# Patient Record
Sex: Female | Born: 1950 | Race: White | Hispanic: No | Smoking: Never smoker
Health system: Southern US, Community
[De-identification: ages and names within clinical notes are randomized; demographics above are authoritative.]

## PROBLEM LIST (undated history)

## (undated) DIAGNOSIS — G4730 Sleep apnea, unspecified: Secondary | ICD-10-CM

## (undated) DIAGNOSIS — I10 Essential (primary) hypertension: Secondary | ICD-10-CM

## (undated) DIAGNOSIS — E785 Hyperlipidemia, unspecified: Secondary | ICD-10-CM

## (undated) DIAGNOSIS — L02419 Cutaneous abscess of limb, unspecified: Secondary | ICD-10-CM

## (undated) DIAGNOSIS — L03119 Cellulitis of unspecified part of limb: Secondary | ICD-10-CM

## (undated) DIAGNOSIS — Z6841 Body Mass Index (BMI) 40.0 and over, adult: Secondary | ICD-10-CM

## (undated) DIAGNOSIS — G5603 Carpal tunnel syndrome, bilateral upper limbs: Secondary | ICD-10-CM

## (undated) DIAGNOSIS — L309 Dermatitis, unspecified: Secondary | ICD-10-CM

## (undated) DIAGNOSIS — R0600 Dyspnea, unspecified: Secondary | ICD-10-CM

## (undated) DIAGNOSIS — E119 Type 2 diabetes mellitus without complications: Secondary | ICD-10-CM

## (undated) MED FILL — Dexamethasone Sodium Phosphate Inj 100 MG/10ML: INTRAMUSCULAR | Qty: 1 | Status: AC

## (undated) MED FILL — Fosaprepitant Dimeglumine For IV Infusion 150 MG (Base Eq): INTRAVENOUS | Qty: 5 | Status: AC

---

## 1995-06-21 HISTORY — PX: LIPOMA EXCISION: SHX5283

## 2009-06-20 HISTORY — PX: HERNIA REPAIR: SHX51

## 2010-01-18 HISTORY — PX: OTHER SURGICAL HISTORY: SHX169

## 2017-11-22 DIAGNOSIS — N950 Postmenopausal bleeding: Secondary | ICD-10-CM | POA: Diagnosis not present

## 2017-11-22 DIAGNOSIS — G4733 Obstructive sleep apnea (adult) (pediatric): Secondary | ICD-10-CM | POA: Diagnosis not present

## 2017-11-22 DIAGNOSIS — Z6841 Body Mass Index (BMI) 40.0 and over, adult: Secondary | ICD-10-CM | POA: Diagnosis not present

## 2017-11-22 DIAGNOSIS — G5603 Carpal tunnel syndrome, bilateral upper limbs: Secondary | ICD-10-CM | POA: Diagnosis not present

## 2017-11-22 DIAGNOSIS — E119 Type 2 diabetes mellitus without complications: Secondary | ICD-10-CM | POA: Diagnosis not present

## 2017-11-22 DIAGNOSIS — L309 Dermatitis, unspecified: Secondary | ICD-10-CM | POA: Diagnosis not present

## 2017-11-22 DIAGNOSIS — E782 Mixed hyperlipidemia: Secondary | ICD-10-CM | POA: Diagnosis not present

## 2017-11-22 DIAGNOSIS — I10 Essential (primary) hypertension: Secondary | ICD-10-CM | POA: Diagnosis not present

## 2017-12-06 DIAGNOSIS — R200 Anesthesia of skin: Secondary | ICD-10-CM | POA: Diagnosis not present

## 2017-12-06 DIAGNOSIS — Z79899 Other long term (current) drug therapy: Secondary | ICD-10-CM | POA: Diagnosis not present

## 2017-12-06 DIAGNOSIS — E119 Type 2 diabetes mellitus without complications: Secondary | ICD-10-CM | POA: Insufficient documentation

## 2017-12-06 DIAGNOSIS — E538 Deficiency of other specified B group vitamins: Secondary | ICD-10-CM | POA: Diagnosis not present

## 2017-12-06 DIAGNOSIS — E1142 Type 2 diabetes mellitus with diabetic polyneuropathy: Secondary | ICD-10-CM | POA: Insufficient documentation

## 2017-12-25 DIAGNOSIS — R200 Anesthesia of skin: Secondary | ICD-10-CM | POA: Diagnosis not present

## 2018-01-15 ENCOUNTER — Other Ambulatory Visit: Payer: Self-pay | Admitting: Family Medicine

## 2018-01-15 DIAGNOSIS — Z1231 Encounter for screening mammogram for malignant neoplasm of breast: Secondary | ICD-10-CM

## 2018-01-15 DIAGNOSIS — R200 Anesthesia of skin: Secondary | ICD-10-CM | POA: Diagnosis not present

## 2018-01-18 ENCOUNTER — Ambulatory Visit
Admission: RE | Admit: 2018-01-18 | Discharge: 2018-01-18 | Disposition: A | Discharge status: Home / Self Care | Payer: PPO | Source: Ambulatory Visit | Attending: Family Medicine | Admitting: Family Medicine

## 2018-01-18 ENCOUNTER — Encounter: Payer: Self-pay | Admitting: Radiology

## 2018-01-18 DIAGNOSIS — Z1231 Encounter for screening mammogram for malignant neoplasm of breast: Secondary | ICD-10-CM | POA: Diagnosis not present

## 2018-01-18 IMAGING — MG MM DIGITAL SCREENING BILAT W/ TOMO W/ CAD
8 of 15 series · 8 of 40 positions shown · non-contrast
Comparison: Previous exam(s).

ACR Breast Density Category a: The breast tissue is almost entirely
fatty.

CLINICAL DATA: Screening.

EXAM:
DIGITAL SCREENING BILATERAL MAMMOGRAM WITH TOMO AND CAD

[R MLO synth-2D (1 of 2)]
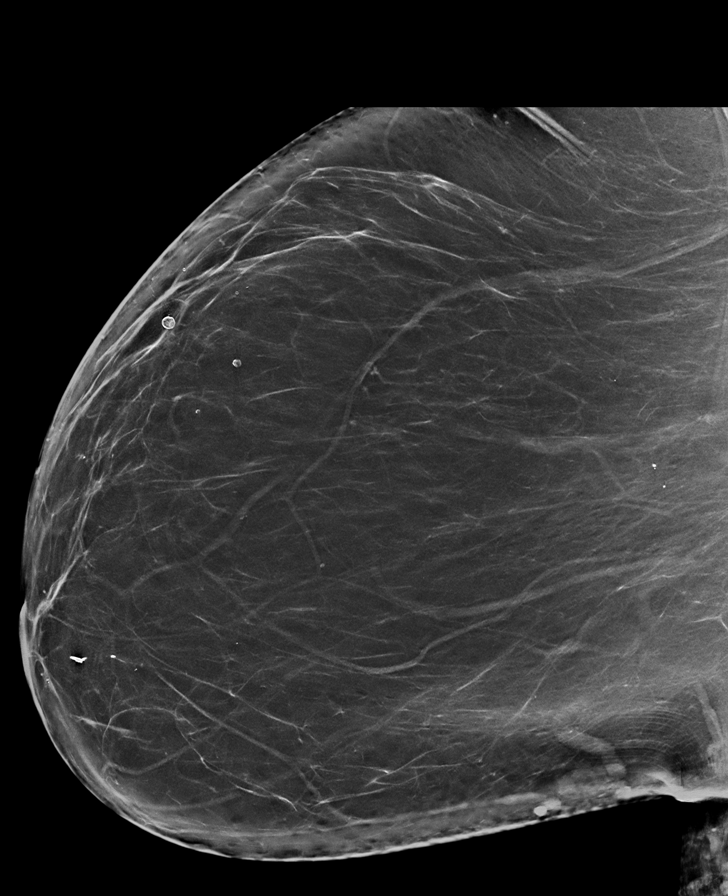

[L MLO synth-2D (1 of 2)]
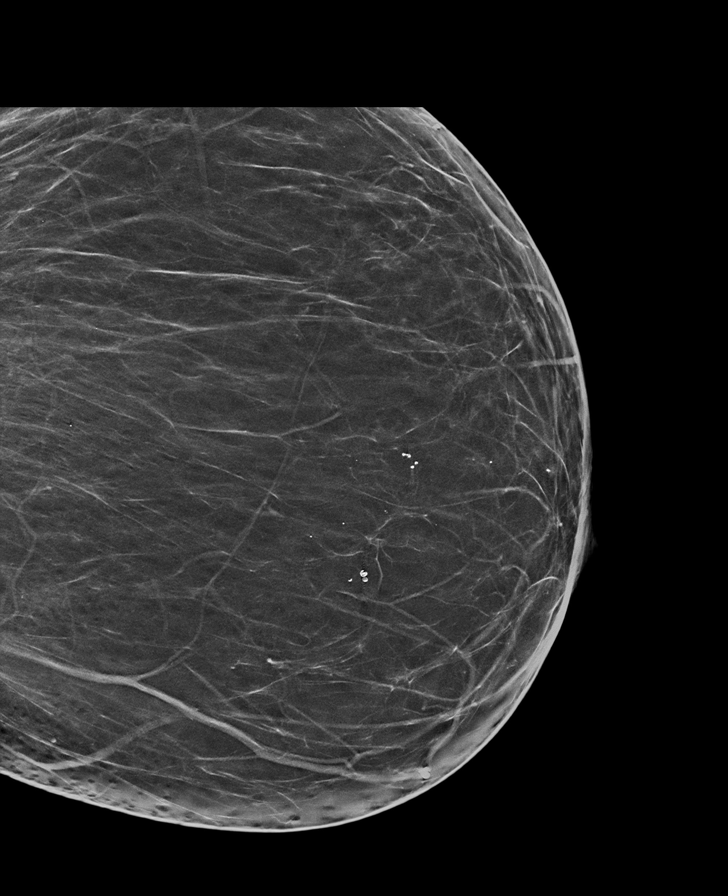

[R CC synth-2D]
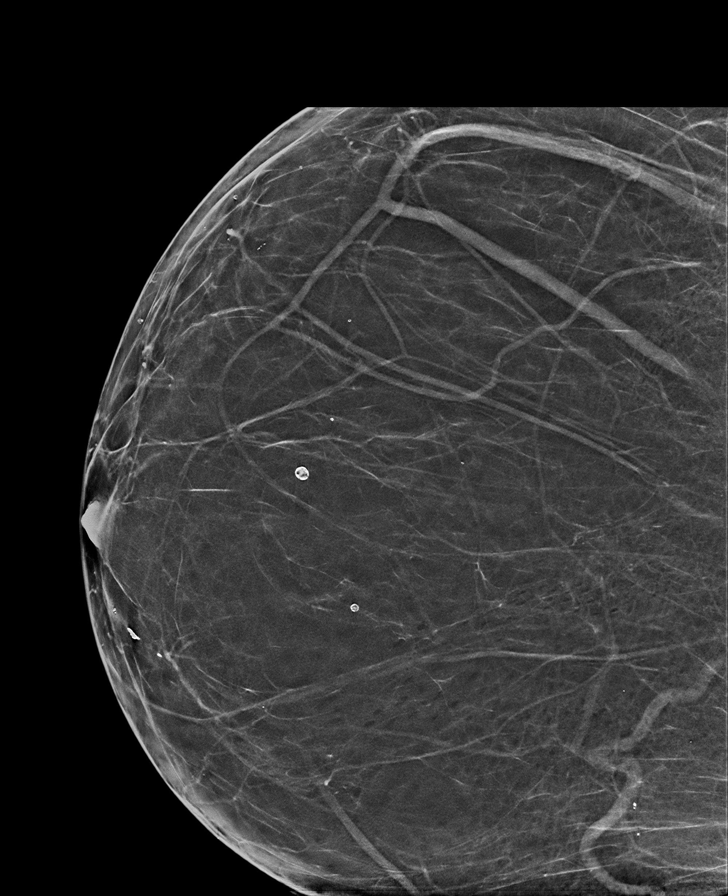

[L MLO synth-2D (2 of 2)]
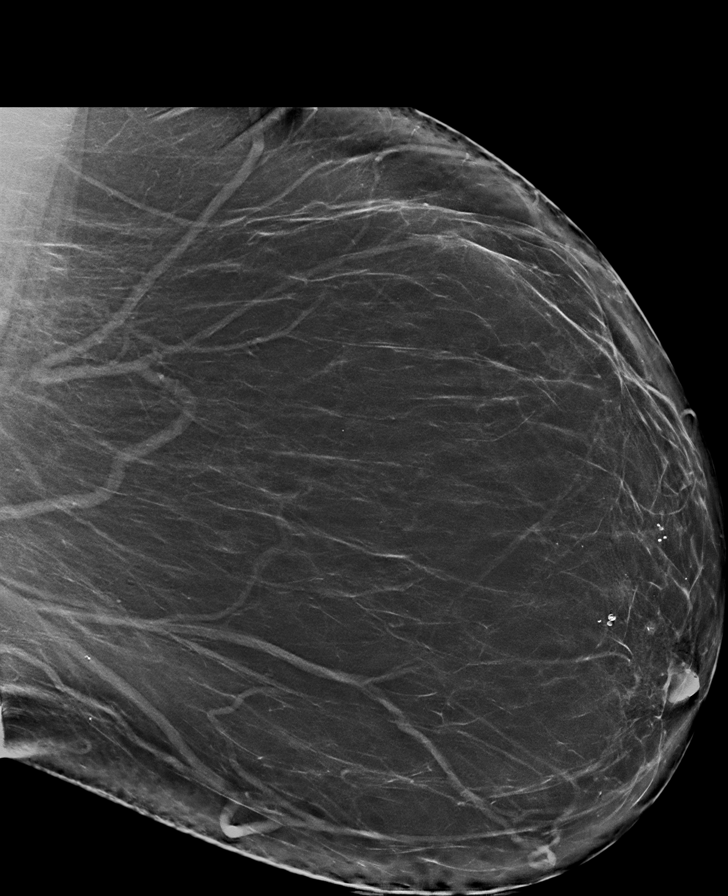

[L CC synth-2D (1 of 2)]
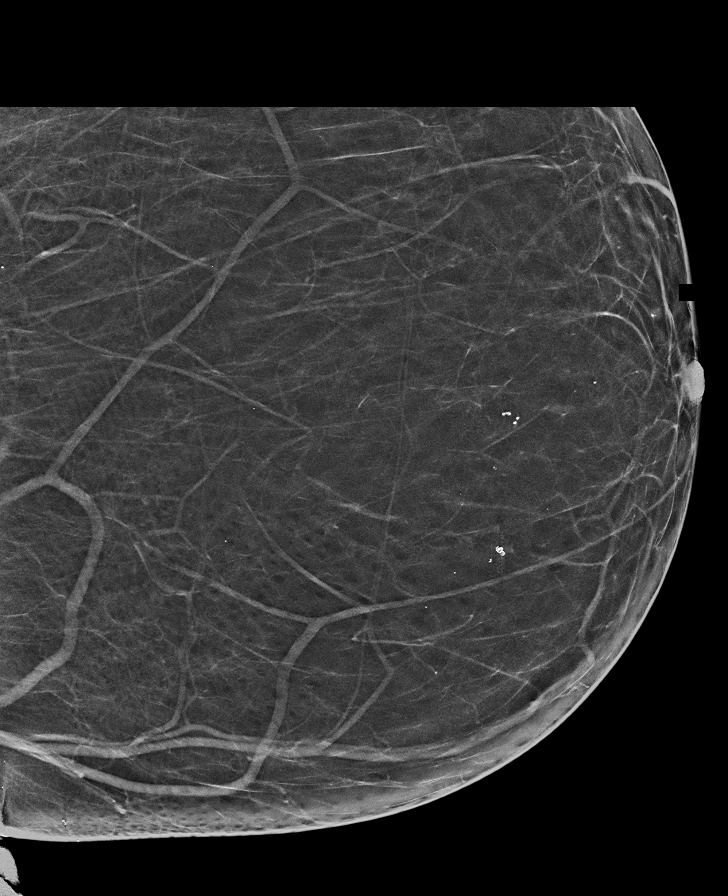

[R MLO synth-2D (2 of 2)]
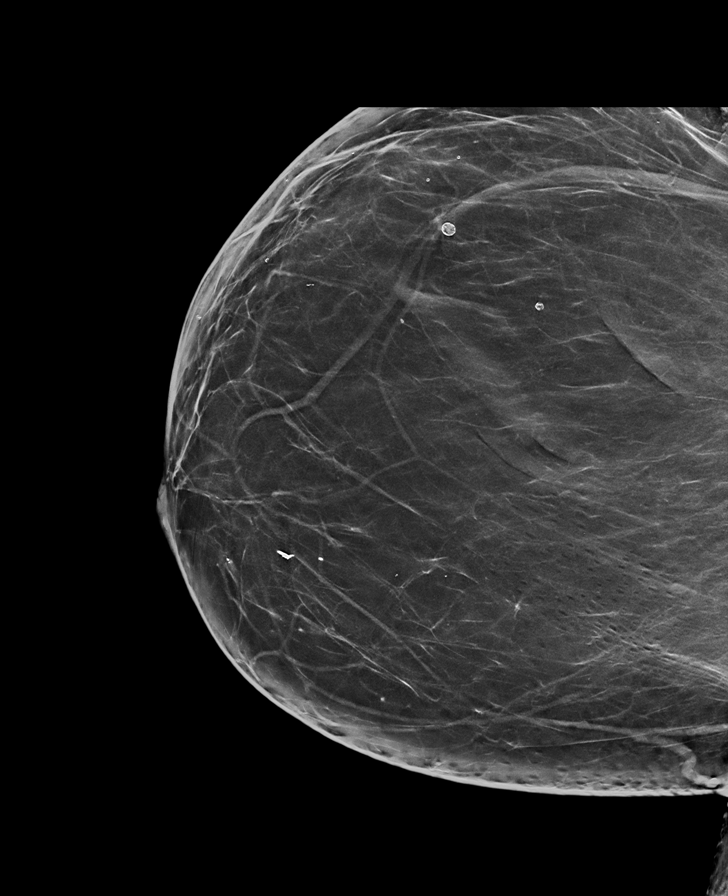

[L CC synth-2D (2 of 2)]
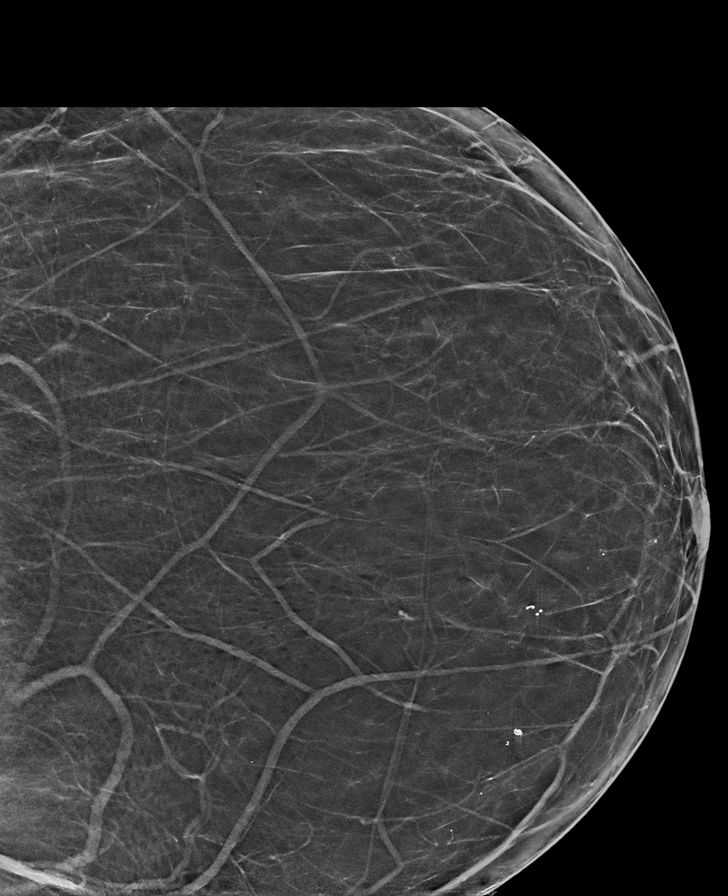

[R CC tomo · tomo slice 51/75.0]
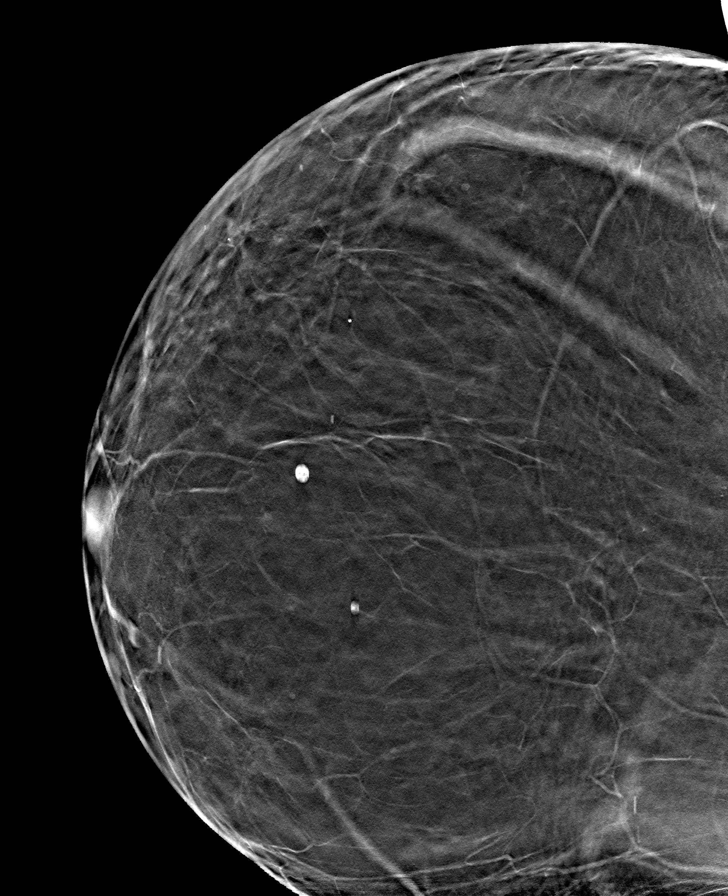

[8 of 40 positions shown; findings below may reference images not displayed]

FINDINGS: There are no findings suspicious for malignancy. Images were
processed with CAD.
IMPRESSION: No mammographic evidence of malignancy. A result letter of this
screening mammogram will be mailed directly to the patient.

RECOMMENDATION:
Screening mammogram in one year. (Code:[TA])

BI-RADS CATEGORY  1: Negative.

## 2018-01-23 DIAGNOSIS — G5603 Carpal tunnel syndrome, bilateral upper limbs: Secondary | ICD-10-CM | POA: Diagnosis not present

## 2018-01-24 ENCOUNTER — Other Ambulatory Visit: Payer: Self-pay | Admitting: *Deleted

## 2018-01-24 ENCOUNTER — Inpatient Hospital Stay
Admission: RE | Admit: 2018-01-24 | Discharge: 2018-01-24 | Disposition: A | Discharge status: Home / Self Care | Payer: Self-pay | Source: Ambulatory Visit | Attending: *Deleted | Admitting: *Deleted

## 2018-01-24 DIAGNOSIS — Z9289 Personal history of other medical treatment: Secondary | ICD-10-CM

## 2018-01-24 DIAGNOSIS — L218 Other seborrheic dermatitis: Secondary | ICD-10-CM | POA: Diagnosis not present

## 2018-01-24 DIAGNOSIS — B372 Candidiasis of skin and nail: Secondary | ICD-10-CM | POA: Diagnosis not present

## 2018-03-05 DIAGNOSIS — E119 Type 2 diabetes mellitus without complications: Secondary | ICD-10-CM | POA: Diagnosis not present

## 2018-03-12 DIAGNOSIS — N950 Postmenopausal bleeding: Secondary | ICD-10-CM | POA: Diagnosis not present

## 2018-03-12 DIAGNOSIS — G5603 Carpal tunnel syndrome, bilateral upper limbs: Secondary | ICD-10-CM | POA: Diagnosis not present

## 2018-03-12 DIAGNOSIS — E1165 Type 2 diabetes mellitus with hyperglycemia: Secondary | ICD-10-CM | POA: Diagnosis not present

## 2018-03-12 DIAGNOSIS — M654 Radial styloid tenosynovitis [de Quervain]: Secondary | ICD-10-CM | POA: Diagnosis not present

## 2018-03-12 DIAGNOSIS — E782 Mixed hyperlipidemia: Secondary | ICD-10-CM | POA: Diagnosis not present

## 2018-03-12 DIAGNOSIS — Z6841 Body Mass Index (BMI) 40.0 and over, adult: Secondary | ICD-10-CM | POA: Diagnosis not present

## 2018-03-12 DIAGNOSIS — G4733 Obstructive sleep apnea (adult) (pediatric): Secondary | ICD-10-CM | POA: Diagnosis not present

## 2018-03-12 DIAGNOSIS — I10 Essential (primary) hypertension: Secondary | ICD-10-CM | POA: Diagnosis not present

## 2018-03-12 DIAGNOSIS — L309 Dermatitis, unspecified: Secondary | ICD-10-CM | POA: Diagnosis not present

## 2018-03-20 DIAGNOSIS — Z6841 Body Mass Index (BMI) 40.0 and over, adult: Secondary | ICD-10-CM

## 2018-03-20 DIAGNOSIS — L03119 Cellulitis of unspecified part of limb: Secondary | ICD-10-CM

## 2018-03-20 DIAGNOSIS — G4730 Sleep apnea, unspecified: Secondary | ICD-10-CM

## 2018-03-20 DIAGNOSIS — R0600 Dyspnea, unspecified: Secondary | ICD-10-CM

## 2018-03-20 DIAGNOSIS — G5603 Carpal tunnel syndrome, bilateral upper limbs: Secondary | ICD-10-CM

## 2018-03-20 DIAGNOSIS — L02419 Cutaneous abscess of limb, unspecified: Secondary | ICD-10-CM

## 2018-03-20 HISTORY — DX: Morbid (severe) obesity due to excess calories: E66.01

## 2018-03-20 HISTORY — DX: Carpal tunnel syndrome, bilateral upper limbs: G56.03

## 2018-03-20 HISTORY — DX: Body Mass Index (BMI) 40.0 and over, adult: Z684

## 2018-03-20 HISTORY — DX: Sleep apnea, unspecified: G47.30

## 2018-03-20 HISTORY — DX: Dyspnea, unspecified: R06.00

## 2018-03-20 HISTORY — DX: Cellulitis of unspecified part of limb: L03.119

## 2018-03-20 HISTORY — PX: DILATION AND CURETTAGE OF UTERUS: SHX78

## 2018-03-20 HISTORY — DX: Cellulitis of unspecified part of limb: L02.419

## 2018-03-28 DIAGNOSIS — G5603 Carpal tunnel syndrome, bilateral upper limbs: Secondary | ICD-10-CM | POA: Diagnosis not present

## 2018-04-05 ENCOUNTER — Other Ambulatory Visit: Payer: Self-pay

## 2018-04-05 ENCOUNTER — Encounter
Admission: RE | Admit: 2018-04-05 | Discharge: 2018-04-05 | Disposition: A | Discharge status: Home / Self Care | Payer: PPO | Source: Ambulatory Visit | Attending: Orthopedic Surgery | Admitting: Orthopedic Surgery

## 2018-04-05 DIAGNOSIS — Z01818 Encounter for other preprocedural examination: Secondary | ICD-10-CM | POA: Insufficient documentation

## 2018-04-05 DIAGNOSIS — I10 Essential (primary) hypertension: Secondary | ICD-10-CM | POA: Diagnosis not present

## 2018-04-05 DIAGNOSIS — G5603 Carpal tunnel syndrome, bilateral upper limbs: Secondary | ICD-10-CM | POA: Insufficient documentation

## 2018-04-05 HISTORY — DX: Body Mass Index (BMI) 40.0 and over, adult: Z684

## 2018-04-05 HISTORY — DX: Type 2 diabetes mellitus without complications: E11.9

## 2018-04-05 HISTORY — DX: Morbid (severe) obesity due to excess calories: E66.01

## 2018-04-05 HISTORY — DX: Cutaneous abscess of limb, unspecified: L02.419

## 2018-04-05 HISTORY — DX: Sleep apnea, unspecified: G47.30

## 2018-04-05 HISTORY — DX: Dyspnea, unspecified: R06.00

## 2018-04-05 HISTORY — DX: Essential (primary) hypertension: I10

## 2018-04-05 HISTORY — DX: Carpal tunnel syndrome, bilateral upper limbs: G56.03

## 2018-04-05 HISTORY — DX: Cellulitis of unspecified part of limb: L03.119

## 2018-04-05 LAB — CBC
HCT: 41.6 % (ref 36.0–46.0)
HEMOGLOBIN: 13.6 g/dL (ref 12.0–15.0)
MCH: 28.6 pg (ref 26.0–34.0)
MCHC: 32.7 g/dL (ref 30.0–36.0)
MCV: 87.6 fL (ref 80.0–100.0)
Platelets: 201 10*3/uL (ref 150–400)
RBC: 4.75 MIL/uL (ref 3.87–5.11)
RDW: 13 % (ref 11.5–15.5)
WBC: 6.7 10*3/uL (ref 4.0–10.5)
nRBC: 0 % (ref 0.0–0.2)

## 2018-04-05 NOTE — Patient Instructions (Signed)
Your procedure is scheduled on: Thursday, October 24th  Report to [facility]    DO NOT STOP ON THE FIRST FLOOR TO REGISTER  To find out your arrival time please call [phone] between 1PM - 3PM on October 23rd  Remember: Instructions that are not followed completely may result in serious medical risk,  up to and including death, or upon the discretion of your surgeon and anesthesiologist your  surgery may need to be rescheduled.     _X__ 1. Do not eat food after midnight the night before your procedure.                 No gum chewing or hard candies..                     ABSOLUTELY NOTHING SOLID IN YOUR MOUTH AFTER MIDNIGHT                  You may drink clear liquids up to 2 hours before you are scheduled to arrive for your surgery-                   DO not drink clear liquids within 2 hours of the start of your surgery.                  Clear Liquids include:  water, apple juice without pulp, clear carbohydrate                 drink such as Clearfast of Gatorade, Black Coffee or Tea (Do not add                 anything to coffee or tea). YOU CAN ADD SUGAR BUT NO DAIRY PRODUCTS  __X__2.  On the morning of surgery brush your teeth with toothpaste and water,                   You may rinse your mouth with mouthwash if you wish.                    Do not swallow any toothpaste of mouthwash.     _X__ 3.  No Alcohol for 24 hours before or after surgery.   _X__ 4.  Do Not Smoke or use e-cigarettes For 24 Hours Prior to Your Surgery.                 Do not use any chewable tobacco products for at least 6 hours prior to                 surgery.  ____  5.  Bring all medications with you on the day of surgery if instructed.   ____  6.  Notify your doctor if there is any change in your medical condition      (cold, fever, infections).     Do not wear jewelry, make-up, hairpins, clips or nail polish. Do not wear lotions, powders, or  perfumes. You may wear deodorant. Do not shave 48 hours prior to surgery. Men may shave face and neck. Do not bring valuables to the hospital.    [facility] is not responsible for any belongings or valuables.  Contacts, dentures or bridgework may not be worn into surgery. Leave your suitcase in the car. After surgery it may be brought to your room. For patients admitted to the hospital, discharge time is determined by your treatment team.  Patients discharged the day of surgery will not be allowed to drive home.   Please read over the following fact sheets that you were given:   [place]   ____ Take these medicines the morning of surgery with A SIP OF WATER:    1. NONE  2.   3.   4.  5.  6.  ____ Fleet Enema (as directed)   __X__ Use CHG Soap as directed  ____ Use inhalers on the day of surgery  _X___ Stop metformin 2 days prior to surgery. LAST DOSE ON April 09, 2018    ____ Take 1/2 of usual insulin dose the night before surgery. No insulin the morning          of surgery.   _X___ Stop ASPIRIN BASED PRODUCTS  _X___ Stop Anti-inflammatories AS OF TODAY.                   YOU MAY TAKE TYLENOL ANYTIME UP UNTIL SURGERY]   _X___ Stop supplements until after surgery.  STOP ALPHA LIPOIC ACID AS OF TODAY  _X___ Bring C-Pap to the hospital.   CONTINUE TAKING LOSARTAN BUT DO NOT TAKE ON THE DAY OF SURGERY  IF YOU COMPLETE THE MEDICAL DIRECTIVES, PLEASE BRING WITH Y OU SO WE MAY PLACE A COPY IN YOUR CHART

## 2018-04-05 NOTE — Pre-Procedure Instructions (Signed)
Patient states that she has no one that would be able to stay OVN with her following surgery. She has a driver to and from the hospital but that is all. Discussed the reasons why it is necessary for her to have someone around for the first 24 hours. She is also dependent on a walker to get around her home and outside. Suggested she look into the attachment for her left arm so she is not using it to put excessive weight on wrist. Also, discussed with Dr. Rosey Bath regarding her BMI of over 70. He would like to evaluate her airway prior to surgery.  Dr. Rosey Bath came and spoke with patient regarding having someone stay ovn with her. He also evaluated her airway and felt comfortable to proceed with surgery

## 2018-04-12 ENCOUNTER — Ambulatory Visit: Payer: PPO | Admitting: Certified Registered"

## 2018-04-12 ENCOUNTER — Encounter: Payer: Self-pay | Admitting: *Deleted

## 2018-04-12 ENCOUNTER — Other Ambulatory Visit: Payer: Self-pay

## 2018-04-12 ENCOUNTER — Ambulatory Visit
Admission: RE | Admit: 2018-04-12 | Discharge: 2018-04-12 | Disposition: A | Discharge status: Home / Self Care | Payer: PPO | Source: Ambulatory Visit | Attending: Orthopedic Surgery | Admitting: Orthopedic Surgery

## 2018-04-12 ENCOUNTER — Encounter
Admission: RE | Disposition: A | Discharge status: Home / Self Care | Payer: Self-pay | Source: Ambulatory Visit | Attending: Orthopedic Surgery

## 2018-04-12 DIAGNOSIS — E119 Type 2 diabetes mellitus without complications: Secondary | ICD-10-CM | POA: Diagnosis not present

## 2018-04-12 DIAGNOSIS — G5602 Carpal tunnel syndrome, left upper limb: Secondary | ICD-10-CM | POA: Diagnosis not present

## 2018-04-12 DIAGNOSIS — Z6841 Body Mass Index (BMI) 40.0 and over, adult: Secondary | ICD-10-CM | POA: Diagnosis not present

## 2018-04-12 DIAGNOSIS — G4730 Sleep apnea, unspecified: Secondary | ICD-10-CM | POA: Insufficient documentation

## 2018-04-12 DIAGNOSIS — I10 Essential (primary) hypertension: Secondary | ICD-10-CM | POA: Diagnosis not present

## 2018-04-12 HISTORY — PX: CARPAL TUNNEL RELEASE: SHX101

## 2018-04-12 LAB — GLUCOSE, CAPILLARY
Glucose-Capillary: 186 mg/dL — ABNORMAL HIGH (ref 70–99)
Glucose-Capillary: 159 mg/dL — ABNORMAL HIGH (ref 70–99)

## 2018-04-12 SURGERY — CARPAL TUNNEL RELEASE
Anesthesia: General | Laterality: Left

## 2018-04-12 MED ORDER — PROPOFOL 500 MG/50ML IV EMUL
INTRAVENOUS | Status: DC | PRN
  Administered 2018-04-12: 80 ug/kg/min via INTRAVENOUS

## 2018-04-12 MED ORDER — PROPOFOL 500 MG/50ML IV EMUL
INTRAVENOUS | Status: AC
  Filled 2018-04-12: qty 50

## 2018-04-12 MED ORDER — HYDROCODONE-ACETAMINOPHEN 5-325 MG PO TABS: 1.0000 | ORAL_TABLET | ORAL | 0 refills | Status: DC | PRN

## 2018-04-12 MED ORDER — FENTANYL CITRATE (PF) 100 MCG/2ML IJ SOLN
INTRAMUSCULAR | Status: DC | PRN
  Administered 2018-04-12: 75 ug via INTRAVENOUS
  Administered 2018-04-12: 25 ug via INTRAVENOUS

## 2018-04-12 MED ORDER — FENTANYL CITRATE (PF) 100 MCG/2ML IJ SOLN
INTRAMUSCULAR | Status: AC
  Filled 2018-04-12: qty 2

## 2018-04-12 MED ORDER — FAMOTIDINE 20 MG PO TABS
ORAL_TABLET | ORAL | Status: AC
  Filled 2018-04-12: qty 1

## 2018-04-12 MED ORDER — PROPOFOL 10 MG/ML IV BOLUS
INTRAVENOUS | Status: AC
  Filled 2018-04-12: qty 20

## 2018-04-12 MED ORDER — BUPIVACAINE HCL 0.5 % IJ SOLN
INTRAMUSCULAR | Status: DC | PRN
  Administered 2018-04-12: 10 mL

## 2018-04-12 MED ORDER — PROPOFOL 10 MG/ML IV BOLUS
INTRAVENOUS | Status: DC | PRN
  Administered 2018-04-12: 180 mg via INTRAVENOUS

## 2018-04-12 MED ORDER — LIDOCAINE HCL (PF) 2 % IJ SOLN
INTRAMUSCULAR | Status: AC
  Filled 2018-04-12: qty 10

## 2018-04-12 MED ORDER — SODIUM CHLORIDE 0.9 % IV SOLN
INTRAVENOUS | Status: DC
  Administered 2018-04-12: 08:00:00 via INTRAVENOUS

## 2018-04-12 MED ORDER — ONDANSETRON HCL 4 MG/2ML IJ SOLN
INTRAMUSCULAR | Status: AC
  Filled 2018-04-12: qty 2

## 2018-04-12 MED ORDER — SUCCINYLCHOLINE CHLORIDE 20 MG/ML IJ SOLN
INTRAMUSCULAR | Status: DC | PRN
  Administered 2018-04-12: 180 mg via INTRAVENOUS

## 2018-04-12 MED ORDER — FENTANYL CITRATE (PF) 100 MCG/2ML IJ SOLN: 25.0000 ug | INTRAMUSCULAR | Status: DC | PRN

## 2018-04-12 MED ORDER — SUCCINYLCHOLINE CHLORIDE 20 MG/ML IJ SOLN
INTRAMUSCULAR | Status: AC
  Filled 2018-04-12: qty 1

## 2018-04-12 MED ORDER — ONDANSETRON HCL 4 MG/2ML IJ SOLN
INTRAMUSCULAR | Status: DC | PRN
  Administered 2018-04-12: 4 mg via INTRAVENOUS

## 2018-04-12 MED ORDER — FAMOTIDINE 20 MG PO TABS
20.0000 mg | ORAL_TABLET | Freq: Once | ORAL | Status: AC
  Administered 2018-04-12: 20 mg via ORAL

## 2018-04-12 MED ORDER — LIDOCAINE HCL (CARDIAC) PF 100 MG/5ML IV SOSY
PREFILLED_SYRINGE | INTRAVENOUS | Status: DC | PRN
  Administered 2018-04-12: 100 mg via INTRAVENOUS

## 2018-04-12 SURGICAL SUPPLY — 23 items
BANDAGE ACE 3X5.8 VEL STRL LF (GAUZE/BANDAGES/DRESSINGS) ×3 IMPLANT
CANISTER SUCT 1200ML W/VALVE (MISCELLANEOUS) ×3 IMPLANT
CHLORAPREP W/TINT 26ML (MISCELLANEOUS) ×3 IMPLANT
COVER WAND RF STERILE (DRAPES) ×3 IMPLANT
CUFF TOURN 18 STER (MISCELLANEOUS) ×3 IMPLANT
ELECT CAUTERY NEEDLE 2.0 MIC (NEEDLE) IMPLANT
GAUZE PETRO XEROFOAM 1X8 (MISCELLANEOUS) ×3 IMPLANT
GAUZE SPONGE 4X4 12PLY STRL (GAUZE/BANDAGES/DRESSINGS) ×3 IMPLANT
GLOVE SURG SYN 9.0  PF PI (GLOVE) ×2
GLOVE SURG SYN 9.0 PF PI (GLOVE) ×1 IMPLANT
GOWN SRG 2XL LVL 4 RGLN SLV (GOWNS) ×1 IMPLANT
GOWN STRL NON-REIN 2XL LVL4 (GOWNS) ×2
GOWN STRL REUS W/ TWL LRG LVL3 (GOWN DISPOSABLE) ×1 IMPLANT
GOWN STRL REUS W/TWL LRG LVL3 (GOWN DISPOSABLE) ×2
KIT TURNOVER KIT A (KITS) ×3 IMPLANT
NS IRRIG 500ML POUR BTL (IV SOLUTION) ×3 IMPLANT
PACK EXTREMITY ARMC (MISCELLANEOUS) ×3 IMPLANT
PAD CAST CTTN 4X4 STRL (SOFTGOODS) ×1 IMPLANT
PADDING CAST COTTON 4X4 STRL (SOFTGOODS) ×2
SCALPEL PROTECTED #15 DISP (BLADE) ×6 IMPLANT
SUT ETHILON 4-0 (SUTURE) ×2
SUT ETHILON 4-0 FS2 18XMFL BLK (SUTURE) ×1
SUTURE ETHLN 4-0 FS2 18XMF BLK (SUTURE) ×1 IMPLANT

## 2018-04-12 NOTE — Anesthesia Procedure Notes (Signed)
Procedure Name: Intubation Performed by: Lance Muss, CRNA Pre-anesthesia Checklist: Patient identified, Patient being monitored, Timeout performed, Emergency Drugs available and Suction available Patient Re-evaluated:Patient Re-evaluated prior to induction Oxygen Delivery Method: Circle system utilized Preoxygenation: Pre-oxygenation with 100% oxygen Induction Type: IV induction Ventilation: Mask ventilation without difficulty Laryngoscope Size: 3 and McGraph Grade View: Grade I Tube type: Oral Tube size: 7.0 mm Number of attempts: 1 Airway Equipment and Method: Stylet and Video-laryngoscopy Placement Confirmation: ETT inserted through vocal cords under direct vision,  positive ETCO2 and breath sounds checked- equal and bilateral Secured at: 20 cm Tube secured with: Tape Dental Injury: Teeth and Oropharynx as per pre-operative assessment  Future Recommendations: Recommend- induction with short-acting agent, and alternative techniques readily available

## 2018-04-12 NOTE — Discharge Instructions (Addendum)
Loosen Ace wrap prior to dismissal today and if fingers swell.  Work on finger range of motion is much as possible.  Keep dressing clean and dry until recheck.  AMBULATORY SURGERY  DISCHARGE INSTRUCTIONS   1) The drugs that you were given will stay in your system until tomorrow so for the next 24 hours you should not:  A) Drive an automobile B) Make any legal decisions C) Drink any alcoholic beverage   2) You may resume regular meals tomorrow.  Today it is better to start with liquids and gradually work up to solid foods.  You may eat anything you prefer, but it is better to start with liquids, then soup and crackers, and gradually work up to solid foods.   3) Please notify your doctor immediately if you have any unusual bleeding, trouble breathing, redness and pain at the surgery site, drainage, fever, or pain not relieved by medication.    4) Additional Instructions:        Please contact your physician with any problems or Same Day Surgery at [phone], Monday through Friday 6 am to 4 pm, or  at [facility] number at [phone].  Carpal Tunnel Release, Care After Refer to this sheet in the next few weeks. These instructions provide you with information about caring for yourself after your procedure. Your health care provider may also give you more specific instructions. Your treatment has been planned according to current medical practices, but problems sometimes occur. Call your health care provider if you have any problems or questions after your procedure. What can I expect after the procedure? After your procedure, it is typical to have the following:  Pain.  Numbness.  Tingling.  Swelling.  Stiffness.  Bruising.  Follow these instructions at home:  Take medicines only as directed by your health care provider.  There are many different ways to close and cover an incision, including stitches (sutures), skin glue, and adhesive strips. Follow  your health care provider's instructions about: ? Incision care. ? Bandage (dressing) changes and removal. ? Incision closure removal.  Wear a splint or a brace as directed by your surgeon. You may need to do this for 2-3 weeks.  Keep your hand raised (elevated) above the level of your heart while you are resting. Move your fingers often.  Avoid activities that cause hand pain.  Ask your surgeon when you can start to do all of your usual activities again, such as: ? Driving. ? Returning to work. ? Bathing and swimming.  Keep all follow-up visits as directed by your health care provider. This is important. You may need physical therapy for several months to speed healing and regain movement. Contact a health care provider if:  You have drainage, redness, swelling, or pain at your incision site.  You have a fever.  You have chills.  Your pain medicine is not working.  Your symptoms do not go away after 2 months.  Your symptoms go away and then return. Get help right away if:  You have pain or numbness that is getting worse.  Your fingers change color.  You are not able to move your fingers. This information is not intended to replace advice given to you by your health care provider. Make sure you discuss any questions you have with your health care provider. Document Released: 12/24/2004 Document Revised: 11/12/2015 Document Reviewed: 01/22/2014 Elsevier Interactive Patient Education  2018 [employer].

## 2018-04-12 NOTE — Transfer of Care (Signed)
Immediate Anesthesia Transfer of Care Note  Patient: Kathryn Maddox  Procedure(s) Performed: CARPAL TUNNEL RELEASE (Left )  Patient Location: PACU  Anesthesia Type:General  Level of Consciousness: awake, alert , oriented and responds to stimulation  Airway & Oxygen Therapy: Patient Spontanous Breathing and Patient connected to face mask oxygen  Post-op Assessment: Report given to RN and Post -op Vital signs reviewed and stable  Post vital signs: Reviewed and stable  Last Vitals:  Vitals Value Taken Time  BP 103/61 04/12/2018  9:31 AM  Temp 36.3 C 04/12/2018  9:30 AM  Pulse 82 04/12/2018  9:31 AM  Resp 14 04/12/2018  9:31 AM  SpO2 98 % 04/12/2018  9:31 AM  Vitals shown include unvalidated device data.  Last Pain:  Vitals:   04/12/18 0742  TempSrc: Oral  PainSc: 0-No pain         Complications: No apparent anesthesia complications

## 2018-04-12 NOTE — H&P (Signed)
Reviewed paper H+P, will be scanned into chart. No changes noted.  

## 2018-04-12 NOTE — Anesthesia Post-op Follow-up Note (Signed)
Anesthesia QCDR form completed.        

## 2018-04-12 NOTE — Anesthesia Preprocedure Evaluation (Addendum)
Anesthesia Evaluation  Patient identified by MRN, date of birth, ID band Patient awake    Reviewed: Allergy & Precautions, H&P , NPO status , Patient's Chart, lab work & pertinent test results  Airway Mallampati: III  TM Distance: <3 FB Neck ROM: full    Dental  (+) Teeth Intact   Pulmonary shortness of breath, sleep apnea ,    breath sounds clear to auscultation       Cardiovascular Exercise Tolerance: Poor hypertension,  Rhythm:regular     Neuro/Psych  Neuromuscular disease (carpal tunnel) negative psych ROS   GI/Hepatic negative GI ROS, Neg liver ROS,   Endo/Other  diabetesMorbid obesity (super morbid obesity, BMI 76.5)  Renal/GU      Musculoskeletal   Abdominal   Peds  Hematology negative hematology ROS (+)   Anesthesia Other Findings Past Medical History: 03/2018: Carpal tunnel syndrome on both sides 03/2018: Cellulitis and abscess of lower extremity     Comment:  bilateral lower extrems, treated with antibx x 10 days No date: Diabetes mellitus without complication ([place]) 32/7614: Dyspnea     Comment:  with any exercise, due to weight No date: Hypertension 03/2018: Morbid obesity with BMI of 70 and over, adult ([place]) 03/2018: Sleep apnea     Comment:  uses a cpap  Past Surgical History: 03/2018: DILATION AND CURETTAGE OF UTERUS     Comment:  has had 10 d & c's with hysteroscopy 2011: HERNIA REPAIR     Comment:  umbilical w/ obstructed bowel 1997: LIPOMA EXCISION; Right     Comment:  shoulder     Reproductive/Obstetrics negative OB ROS                         Anesthesia Physical Anesthesia Plan  ASA: III  Anesthesia Plan: General ETT   Post-op Pain Management:    Induction:   PONV Risk Score and Plan: Ondansetron and Dexamethasone  Airway Management Planned:   Additional Equipment:   Intra-op Plan:   Post-operative Plan:   Informed Consent: I have reviewed the  patients History and Physical, chart, labs and discussed the procedure including the risks, benefits and alternatives for the proposed anesthesia with the patient or authorized representative who has indicated his/her understanding and acceptance.   Dental Advisory Given  Plan Discussed with: Anesthesiologist, CRNA and Surgeon  Anesthesia Plan Comments:        Anesthesia Quick Evaluation

## 2018-04-12 NOTE — Op Note (Signed)
04/12/2018  9:27 AM  PATIENT:  Kathryn Maddox  66 y.o. female  PRE-OPERATIVE DIAGNOSIS:  carpal tunnel syndrome left  POST-OPERATIVE DIAGNOSIS:  carpal tunnel syndrome left  PROCEDURE:  Procedure(s): CARPAL TUNNEL RELEASE (Left)  SURGEON: Laurene Footman, MD  ASSISTANTS: None  ANESTHESIA:   general  EBL:  No intake/output data recorded.  BLOOD ADMINISTERED:none  DRAINS: none   LOCAL MEDICATIONS USED:  MARCAINE     SPECIMEN:  No Specimen  DISPOSITION OF SPECIMEN:  N/A  COUNTS:  YES  TOURNIQUET: 11 minutes at 250 mmHg  IMPLANTS: None  DICTATION: .Dragon Dictation patient was brought to the operating room and after adequate general anesthesia was obtained the left arm was prepped and draped in the usual sterile manner.  After patient identification and timeout procedures were completed, tourniquet was raised just below the elbow.  An incision was made in line with the ring metacarpal approximately 2 cm in length.  The skin and subcutaneous tissue was spread and the transverse carpal ligament identified.  This was then opened in the proximal and with a vascular hemostat placed deep and release was carried out distally until fat was noted around the nerve.  Going proximally release was carried out just proximal to the wrist flexion crease after release there is good vascular blush to the nerve there appeared to be significant compression with some hourglass constriction of the median nerve in the midportion of the carpal tunnel after release there is good vascular blush and moderate flexor tenosynovitis was noted there was no mass there are no masses present the incision was then infiltrated with 10 cc of half percent Sensorcaine and the wound was then closed with simple interrupted 4-0 nylon skin sutures.  Xeroform 4 x 4 web roll and Ace wrap applied tourniquet let down  PLAN OF CARE: Discharge to home after PACU  PATIENT DISPOSITION:  PACU - hemodynamically stable.

## 2018-04-13 NOTE — Anesthesia Postprocedure Evaluation (Signed)
Anesthesia Post Note  Patient: Kathryn Maddox  Procedure(s) Performed: CARPAL TUNNEL RELEASE (Left )  Patient location during evaluation: PACU Anesthesia Type: General Level of consciousness: awake and alert Pain management: pain level controlled Vital Signs Assessment: post-procedure vital signs reviewed and stable Respiratory status: spontaneous breathing, nonlabored ventilation, respiratory function stable and patient connected to nasal cannula oxygen Cardiovascular status: blood pressure returned to baseline and stable Postop Assessment: no apparent nausea or vomiting Anesthetic complications: no     Last Vitals:  Vitals:   04/12/18 1005 04/12/18 1042  BP: (!) 159/75 132/64  Pulse: 68 66  Resp: 18 19  Temp: 36.5 C (!) 35.9 C  SpO2: 92% 94%    Last Pain:  Vitals:   04/12/18 1042  TempSrc: Temporal  PainSc: 0-No pain                 Durenda Hurt

## 2018-04-27 DIAGNOSIS — G5602 Carpal tunnel syndrome, left upper limb: Secondary | ICD-10-CM | POA: Diagnosis not present

## 2018-05-04 DIAGNOSIS — E119 Type 2 diabetes mellitus without complications: Secondary | ICD-10-CM | POA: Diagnosis not present

## 2018-05-30 DIAGNOSIS — G4733 Obstructive sleep apnea (adult) (pediatric): Secondary | ICD-10-CM | POA: Diagnosis not present

## 2018-06-04 DIAGNOSIS — E782 Mixed hyperlipidemia: Secondary | ICD-10-CM | POA: Diagnosis not present

## 2018-06-04 DIAGNOSIS — E1165 Type 2 diabetes mellitus with hyperglycemia: Secondary | ICD-10-CM | POA: Diagnosis not present

## 2018-06-14 DIAGNOSIS — L309 Dermatitis, unspecified: Secondary | ICD-10-CM | POA: Diagnosis not present

## 2018-06-14 DIAGNOSIS — E782 Mixed hyperlipidemia: Secondary | ICD-10-CM | POA: Diagnosis not present

## 2018-06-14 DIAGNOSIS — M25511 Pain in right shoulder: Secondary | ICD-10-CM | POA: Diagnosis not present

## 2018-06-14 DIAGNOSIS — G4733 Obstructive sleep apnea (adult) (pediatric): Secondary | ICD-10-CM | POA: Diagnosis not present

## 2018-06-14 DIAGNOSIS — Z6841 Body Mass Index (BMI) 40.0 and over, adult: Secondary | ICD-10-CM | POA: Diagnosis not present

## 2018-06-14 DIAGNOSIS — E1165 Type 2 diabetes mellitus with hyperglycemia: Secondary | ICD-10-CM | POA: Diagnosis not present

## 2018-06-14 DIAGNOSIS — I10 Essential (primary) hypertension: Secondary | ICD-10-CM | POA: Diagnosis not present

## 2018-06-14 DIAGNOSIS — N950 Postmenopausal bleeding: Secondary | ICD-10-CM | POA: Diagnosis not present

## 2018-07-02 DIAGNOSIS — G4733 Obstructive sleep apnea (adult) (pediatric): Secondary | ICD-10-CM | POA: Diagnosis not present

## 2018-08-01 DIAGNOSIS — G4733 Obstructive sleep apnea (adult) (pediatric): Secondary | ICD-10-CM | POA: Diagnosis not present

## 2018-09-13 DIAGNOSIS — G4733 Obstructive sleep apnea (adult) (pediatric): Secondary | ICD-10-CM | POA: Diagnosis not present

## 2018-10-08 DIAGNOSIS — E1165 Type 2 diabetes mellitus with hyperglycemia: Secondary | ICD-10-CM | POA: Diagnosis not present

## 2018-10-08 DIAGNOSIS — E782 Mixed hyperlipidemia: Secondary | ICD-10-CM | POA: Diagnosis not present

## 2018-10-15 DIAGNOSIS — E782 Mixed hyperlipidemia: Secondary | ICD-10-CM | POA: Diagnosis not present

## 2018-10-15 DIAGNOSIS — Z6841 Body Mass Index (BMI) 40.0 and over, adult: Secondary | ICD-10-CM | POA: Diagnosis not present

## 2018-10-15 DIAGNOSIS — I10 Essential (primary) hypertension: Secondary | ICD-10-CM | POA: Diagnosis not present

## 2018-10-15 DIAGNOSIS — E1165 Type 2 diabetes mellitus with hyperglycemia: Secondary | ICD-10-CM | POA: Diagnosis not present

## 2018-10-15 DIAGNOSIS — G4733 Obstructive sleep apnea (adult) (pediatric): Secondary | ICD-10-CM | POA: Diagnosis not present

## 2018-10-24 DIAGNOSIS — R600 Localized edema: Secondary | ICD-10-CM | POA: Diagnosis not present

## 2018-10-24 DIAGNOSIS — E1142 Type 2 diabetes mellitus with diabetic polyneuropathy: Secondary | ICD-10-CM | POA: Diagnosis not present

## 2018-10-24 DIAGNOSIS — L03031 Cellulitis of right toe: Secondary | ICD-10-CM | POA: Diagnosis not present

## 2018-11-14 DIAGNOSIS — G4733 Obstructive sleep apnea (adult) (pediatric): Secondary | ICD-10-CM | POA: Diagnosis not present

## 2018-12-28 DIAGNOSIS — G4733 Obstructive sleep apnea (adult) (pediatric): Secondary | ICD-10-CM | POA: Diagnosis not present

## 2019-01-07 DIAGNOSIS — E782 Mixed hyperlipidemia: Secondary | ICD-10-CM | POA: Diagnosis not present

## 2019-01-07 DIAGNOSIS — E1165 Type 2 diabetes mellitus with hyperglycemia: Secondary | ICD-10-CM | POA: Diagnosis not present

## 2019-01-14 DIAGNOSIS — E782 Mixed hyperlipidemia: Secondary | ICD-10-CM | POA: Diagnosis not present

## 2019-01-14 DIAGNOSIS — I10 Essential (primary) hypertension: Secondary | ICD-10-CM | POA: Diagnosis not present

## 2019-01-14 DIAGNOSIS — E1165 Type 2 diabetes mellitus with hyperglycemia: Secondary | ICD-10-CM | POA: Diagnosis not present

## 2019-01-14 DIAGNOSIS — Z6841 Body Mass Index (BMI) 40.0 and over, adult: Secondary | ICD-10-CM | POA: Diagnosis not present

## 2019-01-14 DIAGNOSIS — G4733 Obstructive sleep apnea (adult) (pediatric): Secondary | ICD-10-CM | POA: Diagnosis not present

## 2019-01-14 DIAGNOSIS — Z0000 Encounter for general adult medical examination without abnormal findings: Secondary | ICD-10-CM | POA: Diagnosis not present

## 2019-01-15 ENCOUNTER — Other Ambulatory Visit: Payer: Self-pay | Admitting: Family Medicine

## 2019-01-15 DIAGNOSIS — Z1231 Encounter for screening mammogram for malignant neoplasm of breast: Secondary | ICD-10-CM

## 2019-01-24 ENCOUNTER — Other Ambulatory Visit: Payer: Self-pay

## 2019-01-24 ENCOUNTER — Encounter (INDEPENDENT_AMBULATORY_CARE_PROVIDER_SITE_OTHER): Payer: Self-pay

## 2019-01-24 ENCOUNTER — Ambulatory Visit
Admission: RE | Admit: 2019-01-24 | Discharge: 2019-01-24 | Disposition: A | Discharge status: Home / Self Care | Payer: PPO | Source: Ambulatory Visit | Attending: Family Medicine | Admitting: Family Medicine

## 2019-01-24 DIAGNOSIS — Z1231 Encounter for screening mammogram for malignant neoplasm of breast: Secondary | ICD-10-CM | POA: Diagnosis not present

## 2019-01-24 IMAGING — MG DIGITAL SCREENING BILATERAL MAMMOGRAM WITH TOMO AND CAD
8 of 17 series · 8 of 40 positions shown · non-contrast
Comparison: Previous exam(s).

ACR Breast Density Category a: The breast tissue is almost entirely
fatty.

CLINICAL DATA: Screening.

EXAM:
DIGITAL SCREENING BILATERAL MAMMOGRAM WITH TOMO AND CAD

[L MLO synth-2D]
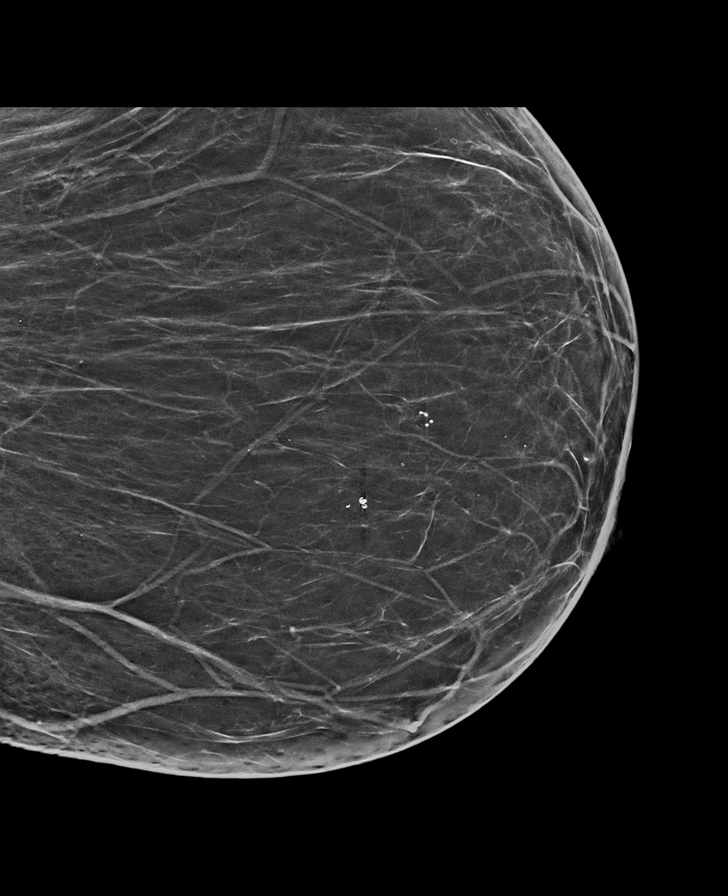

[L CC synth-2D (1 of 2)]
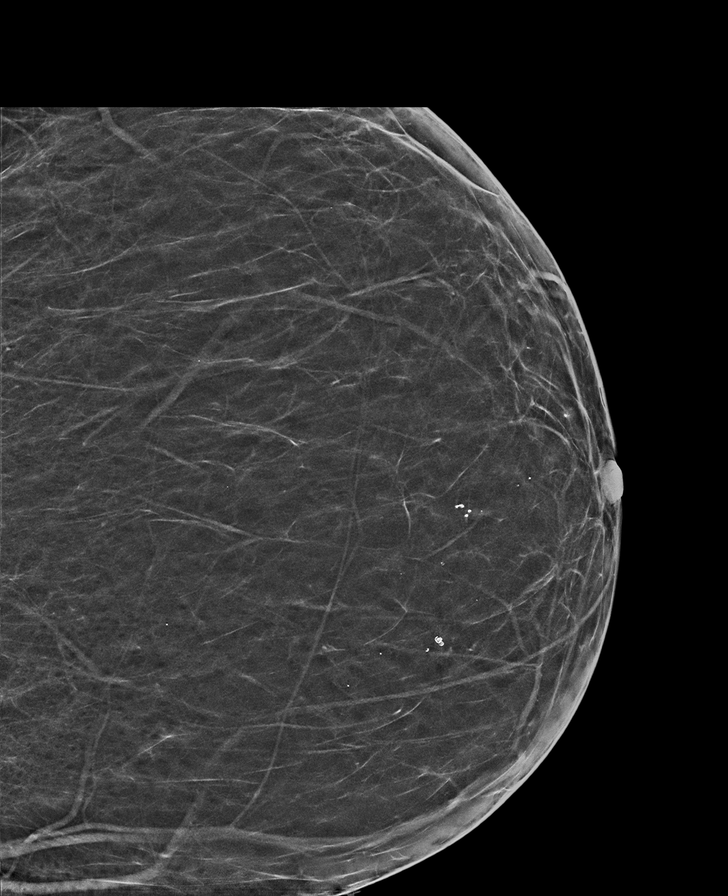

[R MLO synth-2D (1 of 2)]
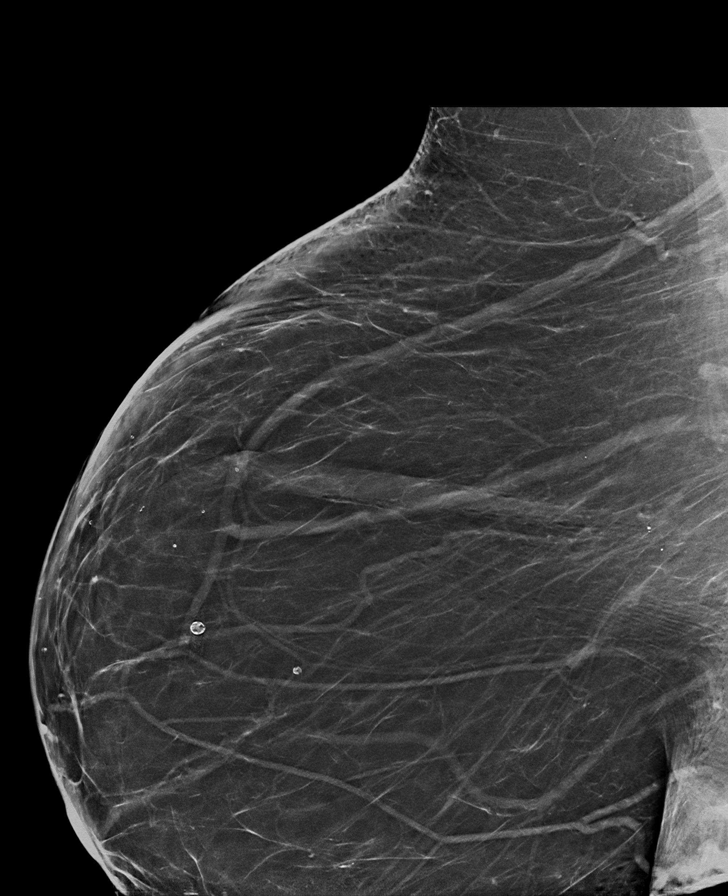

[R CC synth-2D (1 of 2)]
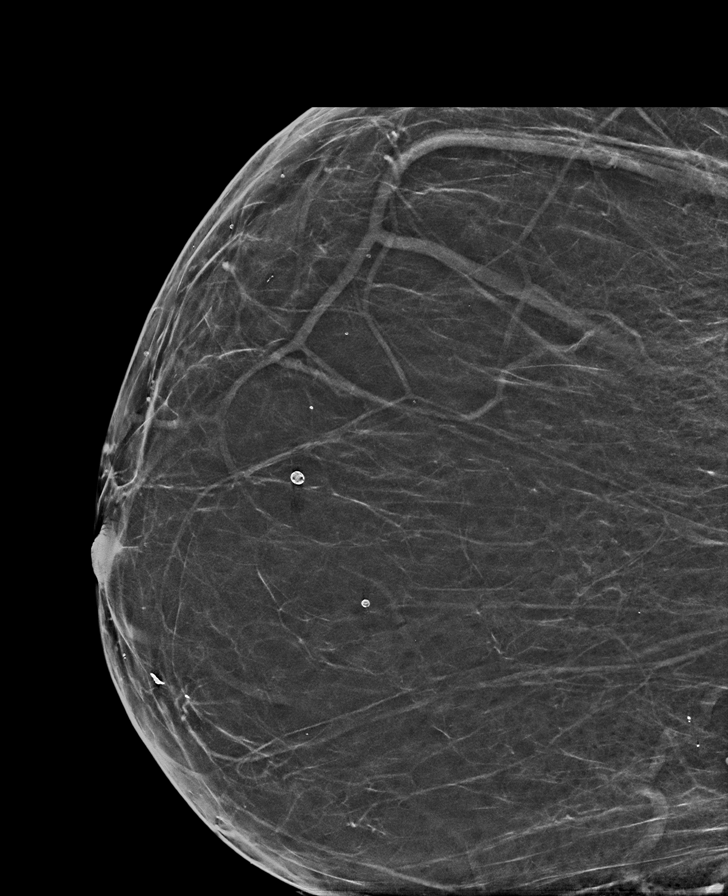

[R CC synth-2D (2 of 2)]
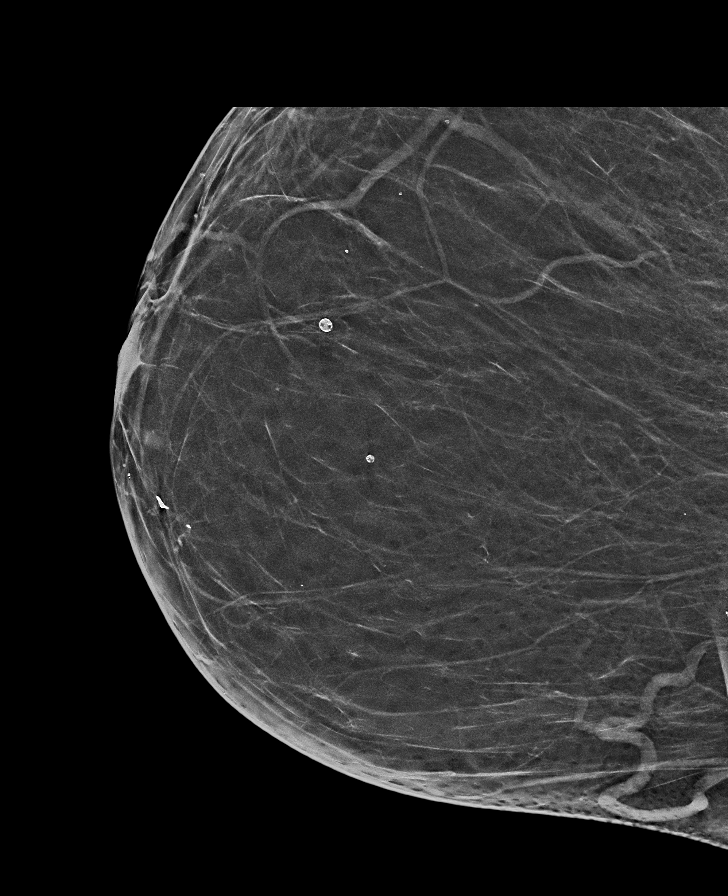

[L CC synth-2D (2 of 2)]
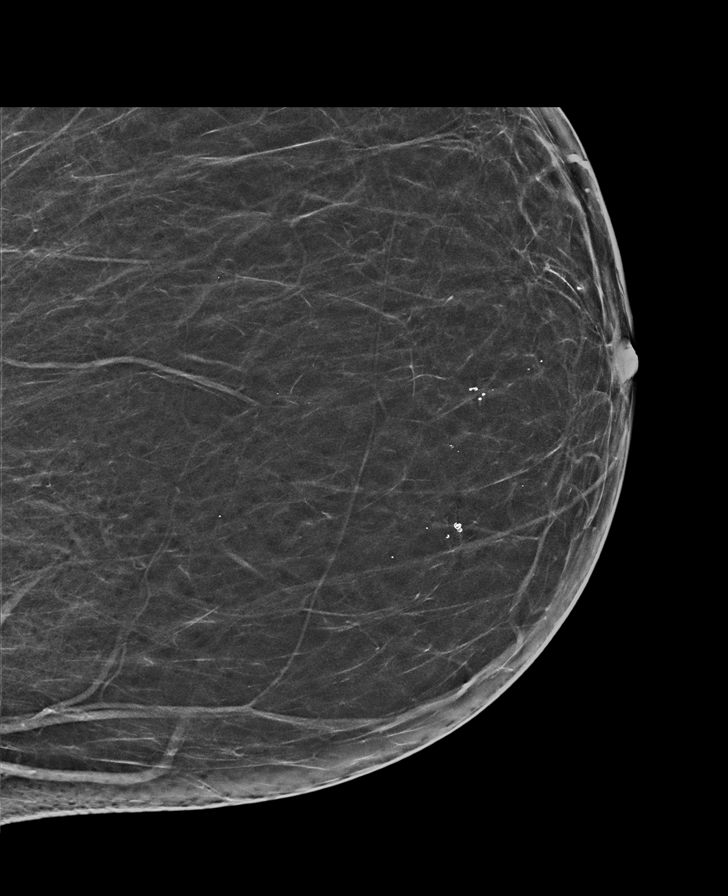

[R MLO synth-2D (2 of 2)]
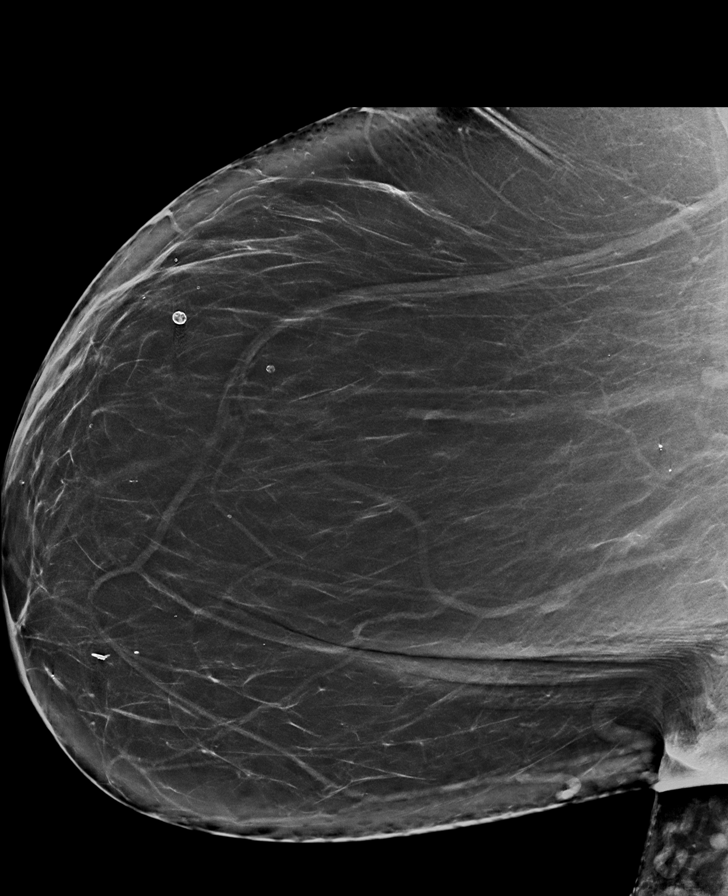

[R CC tomo · tomo slice 36/71.0]
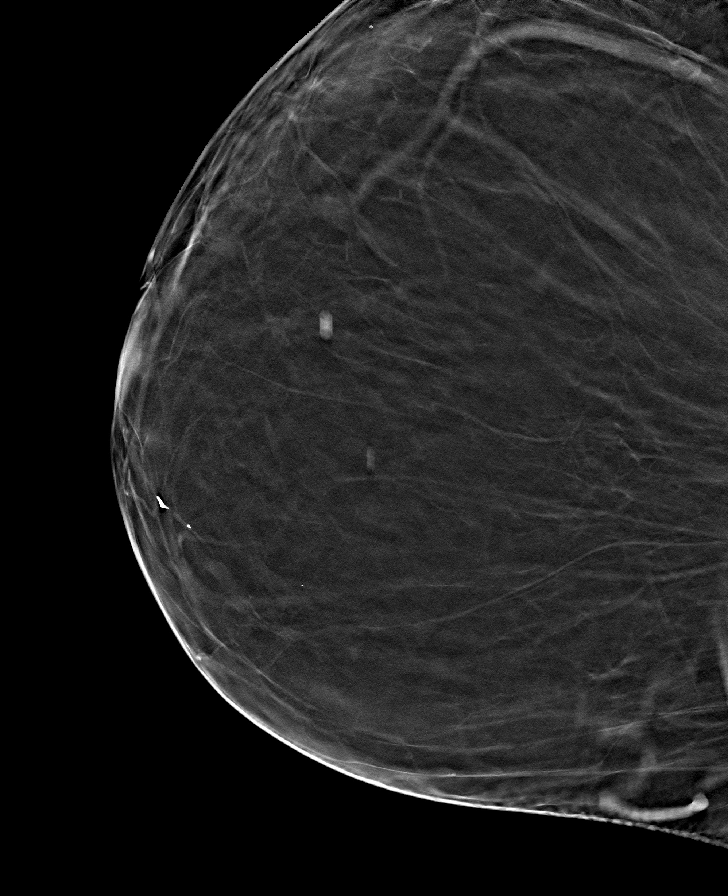

[8 of 40 positions shown; findings below may reference images not displayed]

FINDINGS: There are no findings suspicious for malignancy. Images were
processed with CAD.
IMPRESSION: No mammographic evidence of malignancy. A result letter of this
screening mammogram will be mailed directly to the patient.

RECOMMENDATION:
Screening mammogram in one year. (Code:[TA])

BI-RADS CATEGORY  1: Negative.

## 2019-04-09 DIAGNOSIS — E1165 Type 2 diabetes mellitus with hyperglycemia: Secondary | ICD-10-CM | POA: Diagnosis not present

## 2019-04-09 DIAGNOSIS — E782 Mixed hyperlipidemia: Secondary | ICD-10-CM | POA: Diagnosis not present

## 2019-04-15 DIAGNOSIS — G4733 Obstructive sleep apnea (adult) (pediatric): Secondary | ICD-10-CM | POA: Diagnosis not present

## 2019-04-17 DIAGNOSIS — E1165 Type 2 diabetes mellitus with hyperglycemia: Secondary | ICD-10-CM | POA: Diagnosis not present

## 2019-04-17 DIAGNOSIS — R2681 Unsteadiness on feet: Secondary | ICD-10-CM | POA: Diagnosis not present

## 2019-04-17 DIAGNOSIS — I10 Essential (primary) hypertension: Secondary | ICD-10-CM | POA: Diagnosis not present

## 2019-04-17 DIAGNOSIS — E782 Mixed hyperlipidemia: Secondary | ICD-10-CM | POA: Diagnosis not present

## 2019-04-17 DIAGNOSIS — G4733 Obstructive sleep apnea (adult) (pediatric): Secondary | ICD-10-CM | POA: Diagnosis not present

## 2019-04-17 DIAGNOSIS — Z6841 Body Mass Index (BMI) 40.0 and over, adult: Secondary | ICD-10-CM | POA: Diagnosis not present

## 2019-05-10 DIAGNOSIS — E119 Type 2 diabetes mellitus without complications: Secondary | ICD-10-CM | POA: Diagnosis not present

## 2019-07-11 DIAGNOSIS — E1165 Type 2 diabetes mellitus with hyperglycemia: Secondary | ICD-10-CM | POA: Diagnosis not present

## 2019-07-11 DIAGNOSIS — E782 Mixed hyperlipidemia: Secondary | ICD-10-CM | POA: Diagnosis not present

## 2019-07-18 DIAGNOSIS — E1165 Type 2 diabetes mellitus with hyperglycemia: Secondary | ICD-10-CM | POA: Diagnosis not present

## 2019-07-18 DIAGNOSIS — I10 Essential (primary) hypertension: Secondary | ICD-10-CM | POA: Diagnosis not present

## 2019-07-18 DIAGNOSIS — G4733 Obstructive sleep apnea (adult) (pediatric): Secondary | ICD-10-CM | POA: Diagnosis not present

## 2019-07-18 DIAGNOSIS — Z1211 Encounter for screening for malignant neoplasm of colon: Secondary | ICD-10-CM | POA: Diagnosis not present

## 2019-07-18 DIAGNOSIS — E782 Mixed hyperlipidemia: Secondary | ICD-10-CM | POA: Diagnosis not present

## 2019-07-18 DIAGNOSIS — Z6841 Body Mass Index (BMI) 40.0 and over, adult: Secondary | ICD-10-CM | POA: Diagnosis not present

## 2019-09-02 DIAGNOSIS — Z1211 Encounter for screening for malignant neoplasm of colon: Secondary | ICD-10-CM | POA: Diagnosis not present

## 2019-09-02 DIAGNOSIS — G4733 Obstructive sleep apnea (adult) (pediatric): Secondary | ICD-10-CM | POA: Diagnosis not present

## 2019-09-02 DIAGNOSIS — Z6841 Body Mass Index (BMI) 40.0 and over, adult: Secondary | ICD-10-CM | POA: Diagnosis not present

## 2019-10-09 DIAGNOSIS — E782 Mixed hyperlipidemia: Secondary | ICD-10-CM | POA: Diagnosis not present

## 2019-10-09 DIAGNOSIS — E1165 Type 2 diabetes mellitus with hyperglycemia: Secondary | ICD-10-CM | POA: Diagnosis not present

## 2019-10-16 DIAGNOSIS — Z6841 Body Mass Index (BMI) 40.0 and over, adult: Secondary | ICD-10-CM | POA: Diagnosis not present

## 2019-10-16 DIAGNOSIS — G4733 Obstructive sleep apnea (adult) (pediatric): Secondary | ICD-10-CM | POA: Diagnosis not present

## 2019-10-16 DIAGNOSIS — E782 Mixed hyperlipidemia: Secondary | ICD-10-CM | POA: Diagnosis not present

## 2019-10-16 DIAGNOSIS — I10 Essential (primary) hypertension: Secondary | ICD-10-CM | POA: Diagnosis not present

## 2019-10-16 DIAGNOSIS — E1165 Type 2 diabetes mellitus with hyperglycemia: Secondary | ICD-10-CM | POA: Diagnosis not present

## 2019-11-06 DIAGNOSIS — R2689 Other abnormalities of gait and mobility: Secondary | ICD-10-CM | POA: Diagnosis not present

## 2019-11-06 DIAGNOSIS — R2681 Unsteadiness on feet: Secondary | ICD-10-CM | POA: Diagnosis not present

## 2019-11-06 DIAGNOSIS — M159 Polyosteoarthritis, unspecified: Secondary | ICD-10-CM | POA: Diagnosis not present

## 2019-11-28 ENCOUNTER — Other Ambulatory Visit
Admission: RE | Admit: 2019-11-28 | Discharge: 2019-11-28 | Disposition: A | Discharge status: Home / Self Care | Payer: PPO | Source: Ambulatory Visit | Attending: Internal Medicine | Admitting: Internal Medicine

## 2019-11-28 ENCOUNTER — Other Ambulatory Visit: Payer: Self-pay

## 2019-11-28 DIAGNOSIS — Z20822 Contact with and (suspected) exposure to covid-19: Secondary | ICD-10-CM | POA: Insufficient documentation

## 2019-11-28 DIAGNOSIS — Z01812 Encounter for preprocedural laboratory examination: Secondary | ICD-10-CM | POA: Insufficient documentation

## 2019-11-29 ENCOUNTER — Encounter: Payer: Self-pay | Admitting: Internal Medicine

## 2019-11-29 LAB — SARS CORONAVIRUS 2 (TAT 6-24 HRS): SARS Coronavirus 2: NEGATIVE

## 2019-12-02 ENCOUNTER — Encounter: Payer: Self-pay | Admitting: Internal Medicine

## 2019-12-02 ENCOUNTER — Ambulatory Visit: Payer: PPO | Admitting: Anesthesiology

## 2019-12-02 ENCOUNTER — Ambulatory Visit
Admission: RE | Admit: 2019-12-02 | Discharge: 2019-12-02 | Disposition: A | Discharge status: Home / Self Care | Payer: PPO | Attending: Internal Medicine | Admitting: Internal Medicine

## 2019-12-02 ENCOUNTER — Encounter
Admission: RE | Disposition: A | Discharge status: Home / Self Care | Payer: Self-pay | Source: Home / Self Care | Attending: Internal Medicine

## 2019-12-02 ENCOUNTER — Other Ambulatory Visit: Payer: Self-pay

## 2019-12-02 DIAGNOSIS — I10 Essential (primary) hypertension: Secondary | ICD-10-CM | POA: Diagnosis not present

## 2019-12-02 DIAGNOSIS — K648 Other hemorrhoids: Secondary | ICD-10-CM | POA: Diagnosis not present

## 2019-12-02 DIAGNOSIS — Z79899 Other long term (current) drug therapy: Secondary | ICD-10-CM | POA: Diagnosis not present

## 2019-12-02 DIAGNOSIS — E785 Hyperlipidemia, unspecified: Secondary | ICD-10-CM | POA: Diagnosis not present

## 2019-12-02 DIAGNOSIS — E119 Type 2 diabetes mellitus without complications: Secondary | ICD-10-CM | POA: Insufficient documentation

## 2019-12-02 DIAGNOSIS — Z880 Allergy status to penicillin: Secondary | ICD-10-CM | POA: Insufficient documentation

## 2019-12-02 DIAGNOSIS — Z1211 Encounter for screening for malignant neoplasm of colon: Secondary | ICD-10-CM | POA: Insufficient documentation

## 2019-12-02 DIAGNOSIS — Z6841 Body Mass Index (BMI) 40.0 and over, adult: Secondary | ICD-10-CM | POA: Insufficient documentation

## 2019-12-02 DIAGNOSIS — Z7984 Long term (current) use of oral hypoglycemic drugs: Secondary | ICD-10-CM | POA: Insufficient documentation

## 2019-12-02 DIAGNOSIS — Z888 Allergy status to other drugs, medicaments and biological substances status: Secondary | ICD-10-CM | POA: Diagnosis not present

## 2019-12-02 DIAGNOSIS — K5730 Diverticulosis of large intestine without perforation or abscess without bleeding: Secondary | ICD-10-CM | POA: Diagnosis not present

## 2019-12-02 DIAGNOSIS — G4730 Sleep apnea, unspecified: Secondary | ICD-10-CM | POA: Diagnosis not present

## 2019-12-02 DIAGNOSIS — K640 First degree hemorrhoids: Secondary | ICD-10-CM | POA: Insufficient documentation

## 2019-12-02 DIAGNOSIS — K5790 Diverticulosis of intestine, part unspecified, without perforation or abscess without bleeding: Secondary | ICD-10-CM | POA: Diagnosis not present

## 2019-12-02 HISTORY — DX: Hyperlipidemia, unspecified: E78.5

## 2019-12-02 HISTORY — DX: Dermatitis, unspecified: L30.9

## 2019-12-02 HISTORY — PX: COLONOSCOPY WITH PROPOFOL: SHX5780

## 2019-12-02 LAB — GLUCOSE, CAPILLARY: Glucose-Capillary: 126 mg/dL — ABNORMAL HIGH (ref 70–99)

## 2019-12-02 SURGERY — COLONOSCOPY WITH PROPOFOL
Anesthesia: General

## 2019-12-02 MED ORDER — SODIUM CHLORIDE 0.9 % IV SOLN: INTRAVENOUS | Status: DC

## 2019-12-02 MED ORDER — LIDOCAINE HCL (PF) 2 % IJ SOLN
INTRAMUSCULAR | Status: DC | PRN
  Administered 2019-12-02: 120 mg via INTRADERMAL

## 2019-12-02 MED ORDER — MIDAZOLAM HCL 5 MG/5ML IJ SOLN
INTRAMUSCULAR | Status: DC | PRN
Start: 2019-12-02 — End: 2019-12-02
  Administered 2019-12-02: 1 mg via INTRAVENOUS

## 2019-12-02 MED ORDER — PROPOFOL 500 MG/50ML IV EMUL
INTRAVENOUS | Status: DC | PRN
  Administered 2019-12-02: 100 ug/kg/min via INTRAVENOUS

## 2019-12-02 MED ORDER — MIDAZOLAM HCL 2 MG/2ML IJ SOLN
INTRAMUSCULAR | Status: AC
  Filled 2019-12-02: qty 2

## 2019-12-02 MED ORDER — PROPOFOL 10 MG/ML IV BOLUS
INTRAVENOUS | Status: DC | PRN
  Administered 2019-12-02 (×2): 50 mg via INTRAVENOUS

## 2019-12-02 NOTE — Interval H&P Note (Signed)
History and Physical Interval Note:  12/02/2019 2:06 PM  Kathryn Maddox  has presented today for surgery, with the diagnosis of [place].  The various methods of treatment have been discussed with the patient and family. After consideration of risks, benefits and other options for treatment, the patient has consented to  Procedure(s): COLONOSCOPY WITH PROPOFOL (N/A) as a surgical intervention.  The patient's history has been reviewed, patient examined, no change in status, stable for surgery.  I have reviewed the patient's chart and labs.  Questions were answered to the patient's satisfaction.     [place], [place]

## 2019-12-02 NOTE — Op Note (Signed)
[facility] Gastroenterology Patient Name: Kathryn Maddox Procedure Date: 12/02/2019 2:46 PM MRN: [id number] Account #: [id number] Date of Birth: 10/29/1950 Admit Type: Outpatient Age: 69 Room: [facility] ENDO ROOM 3 Gender: Female Note Status: Finalized Procedure:             Colonoscopy Indications:           Screening for colorectal malignant neoplasm Providers:             Benay Pike. Antino Mayabb MD, MD Medicines:             Propofol per Anesthesia Complications:         No immediate complications. Procedure:             Pre-Anesthesia Assessment:                        - The risks and benefits of the procedure and the                         sedation options and risks were discussed with the                         patient. All questions were answered and informed                         consent was obtained.                        - Patient identification and proposed procedure were                         verified prior to the procedure by the nurse. The                         procedure was verified in the procedure room.                        - ASA Grade Assessment: III - A patient with severe                         systemic disease.                        - After reviewing the risks and benefits, the patient                         was deemed in satisfactory condition to undergo the                         procedure.                        After obtaining informed consent, the colonoscope was                         passed under direct vision. Throughout the procedure,                         the patient's blood pressure, pulse, and oxygen  saturations were monitored continuously. The                         Colonoscope was introduced through the anus and                         advanced to the the cecum, identified by appendiceal                         orifice and ileocecal valve. The colonoscopy was                         somewhat  difficult due to significant looping.                         Successful completion of the procedure was aided by                         applying abdominal pressure. The patient tolerated the                         procedure well. The quality of the bowel preparation                         was adequate. The ileocecal valve, appendiceal                         orifice, and rectum were photographed. Findings:      The perianal and digital rectal examinations were normal. Pertinent       negatives include normal sphincter tone and no palpable rectal lesions.      Internal hemorrhoids were found during retroflexion. The hemorrhoids       were Grade I (internal hemorrhoids that do not prolapse). Estimated       blood loss: none.      Multiple small and large-mouthed diverticula were found in the sigmoid       colon.      The exam was otherwise without abnormality. Impression:            - Internal hemorrhoids.                        - Diverticulosis in the sigmoid colon.                        - The examination was otherwise normal.                        - No specimens collected. Recommendation:        - Patient has a contact number available for                         emergencies. The signs and symptoms of potential                         delayed complications were discussed with the patient.                         Return to normal activities tomorrow. Written  discharge instructions were provided to the patient.                        - Resume previous diet.                        - Continue present medications.                        - Repeat colonoscopy in 10 years for screening                         purposes.                        - Return to GI office PRN.                        - The findings and recommendations were discussed with                         the patient. Procedure Code(s):     --- Professional ---                        E4235,  Colorectal cancer screening; colonoscopy on                         individual not meeting criteria for high risk Diagnosis Code(s):     --- Professional ---                        K57.30, Diverticulosis of large intestine without                         perforation or abscess without bleeding                        K64.0, First degree hemorrhoids                        Z12.11, Encounter for screening for malignant neoplasm                         of colon CPT copyright 2019 American [employer] Association. All rights reserved. The codes documented in this report are preliminary and upon coder review may  be revised to meet current compliance requirements. Efrain Sella MD, MD 12/02/2019 3:15:59 PM This report has been signed electronically. Number of Addenda: 0 Note Initiated On: 12/02/2019 2:46 PM Scope Withdrawal Time: 0 hours 5 minutes 59 seconds  Total Procedure Duration: 0 hours 9 minutes 55 seconds  Estimated Blood Loss:  Estimated blood loss: none.      [facility]

## 2019-12-02 NOTE — Anesthesia Preprocedure Evaluation (Signed)
Anesthesia Evaluation  Patient identified by MRN, date of birth, ID band Patient awake    Reviewed: Allergy & Precautions, H&P , NPO status , Patient's Chart, lab work & pertinent test results  History of Anesthesia Complications Negative for: history of anesthetic complications  Airway Mallampati: III  TM Distance: >3 FB Neck ROM: full    Dental no notable dental hx. (+) Teeth Intact, Dental Advisory Given   Pulmonary shortness of breath and with exertion, sleep apnea and Continuous Positive Airway Pressure Ventilation , neg COPD, Patient abstained from smoking.Not current smoker,     + decreased breath sounds      Cardiovascular Exercise Tolerance: Poor METShypertension, (-) CAD and (-) Past MI (-) dysrhythmias  Rhythm:regular Rate:Normal - Systolic murmurs    Neuro/Psych  Neuromuscular disease (carpal tunnel) negative neurological ROS  negative psych ROS   GI/Hepatic negative GI ROS, Neg liver ROS, neg GERD  ,  Endo/Other  diabetesMorbid obesity (super morbid obesity, BMI 76.5)  Renal/GU negative Renal ROS     Musculoskeletal   Abdominal (+) + obese,   Peds  Hematology negative hematology ROS (+)   Anesthesia Other Findings Past Medical History: 03/2018: Carpal tunnel syndrome on both sides 03/2018: Cellulitis and abscess of lower extremity     Comment:  bilateral lower extrems, treated with antibx x 10 days No date: Diabetes mellitus without complication ([place]) 37/1696: Dyspnea     Comment:  with any exercise, due to weight No date: Hypertension 03/2018: Morbid obesity with BMI of 70 and over, adult ([place]) 03/2018: Sleep apnea     Comment:  uses a cpap  Past Surgical History: 03/2018: DILATION AND CURETTAGE OF UTERUS     Comment:  has had 10 d & c's with hysteroscopy 2011: HERNIA REPAIR     Comment:  umbilical w/ obstructed bowel 1997: LIPOMA EXCISION; Right     Comment:  shoulder      Reproductive/Obstetrics negative OB ROS                             Anesthesia Physical  Anesthesia Plan  ASA: III  Anesthesia Plan: General   Post-op Pain Management:    Induction: Intravenous  PONV Risk Score and Plan: 3 and Propofol infusion and TIVA  Airway Management Planned: Natural Airway, Oral ETT and Nasal CPAP  Additional Equipment: None  Intra-op Plan:   Post-operative Plan:   Informed Consent: I have reviewed the patients History and Physical, chart, labs and discussed the procedure including the risks, benefits and alternatives for the proposed anesthesia with the patient or authorized representative who has indicated his/her understanding and acceptance.     Dental Advisory Given  Plan Discussed with: CRNA and Surgeon  Anesthesia Plan Comments: (Discussed risks of anesthesia with patient, including possibility of difficulty with spontaneous ventilation under anesthesia necessitating airway intervention, PONV, and rare risks such as cardiac or respiratory or neurological events. Patient understands. Patient informed about increased incidence of above perioperative risk due to high BMI. Patient understands. )        Anesthesia Quick Evaluation

## 2019-12-02 NOTE — Anesthesia Procedure Notes (Signed)
Date/Time: 12/02/2019 3:06 PM Performed by: Debe Coder, CRNA Pre-anesthesia Checklist: Patient identified, Emergency Drugs available, Suction available, Patient being monitored and Timeout performed Patient Re-evaluated:Patient Re-evaluated prior to induction Oxygen Delivery Method: Supernova nasal CPAP Preoxygenation: Pre-oxygenation with 100% oxygen

## 2019-12-02 NOTE — Transfer of Care (Signed)
Immediate Anesthesia Transfer of Care Note  Patient: Kathryn Maddox  Procedure(s) Performed: COLONOSCOPY WITH PROPOFOL (N/A )  Patient Location: PACU and Endoscopy Unit  Anesthesia Type:General  Level of Consciousness: awake, alert  and oriented  Airway & Oxygen Therapy: Patient Spontanous Breathing  Post-op Assessment: Report given to RN and Post -op Vital signs reviewed and stable  Post vital signs: Reviewed and stable  Last Vitals:  Vitals Value Taken Time  BP 123/92 12/02/19 1519  Temp    Pulse 75 12/02/19 1519  Resp 29 12/02/19 1519  SpO2 96 % 12/02/19 1519    Last Pain:  Vitals:   12/02/19 1358  TempSrc: Tympanic  PainSc: 0-No pain         Complications: No complications documented.

## 2019-12-02 NOTE — H&P (Signed)
Outpatient short stay form Pre-procedure 12/02/2019 12:04 PM Keyonia Gluth K. Alice Reichert, M.D.  Primary Physician: Thereasa Distance, M.D.  Reason for visit:  Colon cancer screening  History of present illness:  Patient presents for colonoscopy for colon cancer screening. The patient denies complaints of abdominal pain, significant change in bowel habits, or rectal bleeding.     No current facility-administered medications for this encounter.  Current Outpatient Medications:  .  rosuvastatin (CRESTOR) 5 MG tablet, Take 5 mg by mouth daily., Disp: , Rfl:  .  sitaGLIPtin (JANUVIA) 100 MG tablet, Take 100 mg by mouth daily., Disp: , Rfl:  .  acetaminophen (TYLENOL) 650 MG CR tablet, Take 1,300 mg by mouth every 8 (eight) hours as needed for pain., Disp: , Rfl:  .  Alpha-Lipoic Acid 200 MG TABS, Take 200 mg by mouth 3 (three) times daily., Disp: , Rfl:  .  HYDROcodone-acetaminophen (NORCO) 5-325 MG tablet, Take 1 tablet by mouth every 4 (four) hours as needed for moderate pain., Disp: 20 tablet, Rfl: 0 .  losartan (COZAAR) 50 MG tablet, Take 50 mg by mouth daily., Disp: , Rfl:  .  metFORMIN (GLUCOPHAGE-XR) 500 MG 24 hr tablet, Take 500 mg by mouth daily., Disp: , Rfl:   No medications prior to admission.     Allergies  Allergen Reactions  . Megace [Megestrol] Rash  . Penicillins Rash and Other (See Comments)    Has patient had a PCN reaction causing immediate rash, facial/tongue/throat swelling, SOB or lightheadedness with hypotension: Unknown Has patient had a PCN reaction causing severe rash involving mucus membranes or skin necrosis: Unknown Has patient had a PCN reaction that required hospitalization: Unknown Has patient had a PCN reaction occurring within the last 10 years: No If all of the above answers are "NO", then may proceed with Cephalosporin use.   . Pravastatin Other (See Comments)    Muscle pain, extreme constipation     Past Medical History:  Diagnosis Date  . Carpal tunnel  syndrome on both sides 03/2018  . Cellulitis and abscess of lower extremity 03/2018   bilateral lower extrems, treated with antibx x 10 days  . Diabetes mellitus without complication ([place])   . Dyspnea 03/2018   with any exercise, due to weight  . Eczema   . Hyperlipidemia   . Hypertension   . Morbid obesity with BMI of 70 and over, adult ([place]) 03/2018  . Sleep apnea 03/2018   uses a cpap    Review of systems:  Otherwise negative.    Physical Exam  Gen: Alert, oriented. Appears stated age.  HEENT: [place]/AT. PERRLA. Lungs: CTA, no wheezes. CV: RR nl S1, S2. Abd: soft, benign, no masses. BS+ Ext: No edema. Pulses 2+    Planned procedures: Proceed with colonoscopy. The patient understands the nature of the planned procedure, indications, risks, alternatives and potential complications including but not limited to bleeding, infection, perforation, damage to internal organs and possible oversedation/side effects from anesthesia. The patient agrees and gives consent to proceed.  Please refer to procedure notes for findings, recommendations and patient disposition/instructions.     [place]ha Behlke K. Alice Reichert, M.D. Gastroenterology 12/02/2019  12:04 PM

## 2019-12-02 NOTE — Anesthesia Postprocedure Evaluation (Signed)
Anesthesia Post Note  Patient: Kathryn Maddox  Procedure(s) Performed: COLONOSCOPY WITH PROPOFOL (N/A )  Patient location during evaluation: Endoscopy Anesthesia Type: General Level of consciousness: awake and alert Pain management: pain level controlled Vital Signs Assessment: post-procedure vital signs reviewed and stable Respiratory status: spontaneous breathing, nonlabored ventilation, respiratory function stable and patient connected to nasal cannula oxygen Cardiovascular status: blood pressure returned to baseline and stable Postop Assessment: no apparent nausea or vomiting Anesthetic complications: no   No complications documented.   Last Vitals:  Vitals:   12/02/19 1537 12/02/19 1547  BP: 139/70 135/76  Pulse: 69   Resp: (!) 26   Temp:    SpO2: 95%     Last Pain:  Vitals:   12/02/19 1547  TempSrc:   PainSc: 0-No pain                 Arita Miss

## 2019-12-03 ENCOUNTER — Encounter: Payer: Self-pay | Admitting: Internal Medicine

## 2019-12-07 DIAGNOSIS — M159 Polyosteoarthritis, unspecified: Secondary | ICD-10-CM | POA: Diagnosis not present

## 2019-12-07 DIAGNOSIS — R2689 Other abnormalities of gait and mobility: Secondary | ICD-10-CM | POA: Diagnosis not present

## 2019-12-11 ENCOUNTER — Other Ambulatory Visit: Payer: Self-pay | Admitting: Family Medicine

## 2019-12-11 DIAGNOSIS — G4733 Obstructive sleep apnea (adult) (pediatric): Secondary | ICD-10-CM | POA: Diagnosis not present

## 2019-12-11 DIAGNOSIS — Z1231 Encounter for screening mammogram for malignant neoplasm of breast: Secondary | ICD-10-CM

## 2020-01-06 DIAGNOSIS — M159 Polyosteoarthritis, unspecified: Secondary | ICD-10-CM | POA: Diagnosis not present

## 2020-01-06 DIAGNOSIS — R2689 Other abnormalities of gait and mobility: Secondary | ICD-10-CM | POA: Diagnosis not present

## 2020-01-08 DIAGNOSIS — E782 Mixed hyperlipidemia: Secondary | ICD-10-CM | POA: Diagnosis not present

## 2020-01-08 DIAGNOSIS — E1165 Type 2 diabetes mellitus with hyperglycemia: Secondary | ICD-10-CM | POA: Diagnosis not present

## 2020-01-16 DIAGNOSIS — G4733 Obstructive sleep apnea (adult) (pediatric): Secondary | ICD-10-CM | POA: Diagnosis not present

## 2020-01-16 DIAGNOSIS — E782 Mixed hyperlipidemia: Secondary | ICD-10-CM | POA: Diagnosis not present

## 2020-01-16 DIAGNOSIS — I10 Essential (primary) hypertension: Secondary | ICD-10-CM | POA: Diagnosis not present

## 2020-01-16 DIAGNOSIS — Z0000 Encounter for general adult medical examination without abnormal findings: Secondary | ICD-10-CM | POA: Diagnosis not present

## 2020-01-16 DIAGNOSIS — E1165 Type 2 diabetes mellitus with hyperglycemia: Secondary | ICD-10-CM | POA: Diagnosis not present

## 2020-01-16 DIAGNOSIS — Z6841 Body Mass Index (BMI) 40.0 and over, adult: Secondary | ICD-10-CM | POA: Diagnosis not present

## 2020-02-06 ENCOUNTER — Other Ambulatory Visit: Payer: Self-pay

## 2020-02-06 ENCOUNTER — Ambulatory Visit
Admission: RE | Admit: 2020-02-06 | Discharge: 2020-02-06 | Disposition: A | Discharge status: Home / Self Care | Payer: PPO | Source: Ambulatory Visit | Attending: Family Medicine | Admitting: Family Medicine

## 2020-02-06 DIAGNOSIS — Z1231 Encounter for screening mammogram for malignant neoplasm of breast: Secondary | ICD-10-CM | POA: Diagnosis not present

## 2020-02-06 DIAGNOSIS — M159 Polyosteoarthritis, unspecified: Secondary | ICD-10-CM | POA: Diagnosis not present

## 2020-02-06 DIAGNOSIS — R2689 Other abnormalities of gait and mobility: Secondary | ICD-10-CM | POA: Diagnosis not present

## 2020-02-06 IMAGING — MG DIGITAL SCREENING BILAT W/ TOMO W/ CAD
8 of 15 series · 8 of 40 positions shown · non-contrast
Comparison: Previous exam(s).

CLINICAL DATA: Screening.

EXAM:
DIGITAL SCREENING BILATERAL MAMMOGRAM WITH TOMO AND CAD

[L CC synth-2D (1 of 2)]
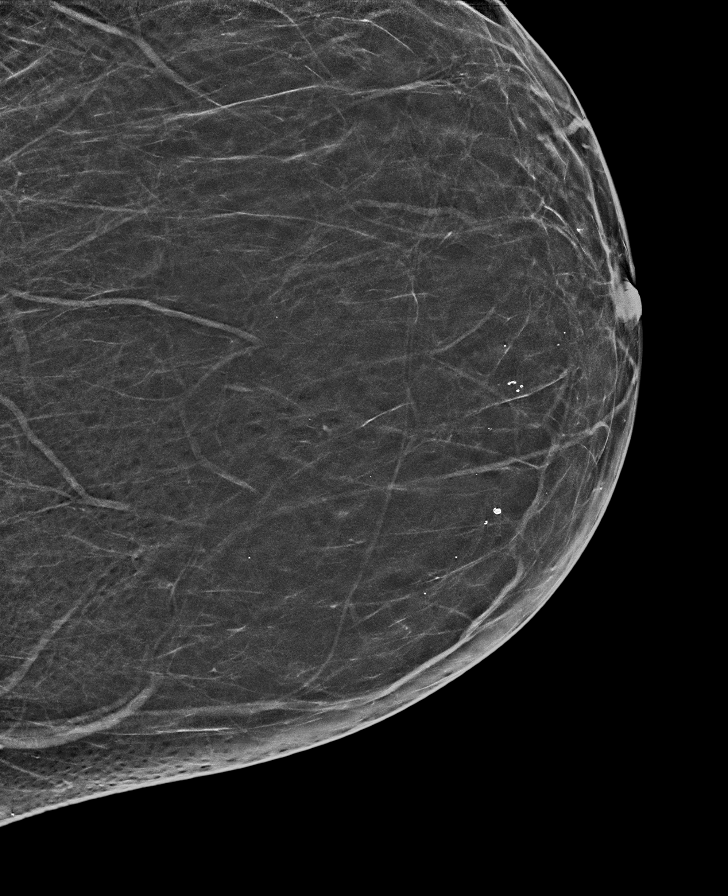

[L MLO synth-2D (1 of 2)]
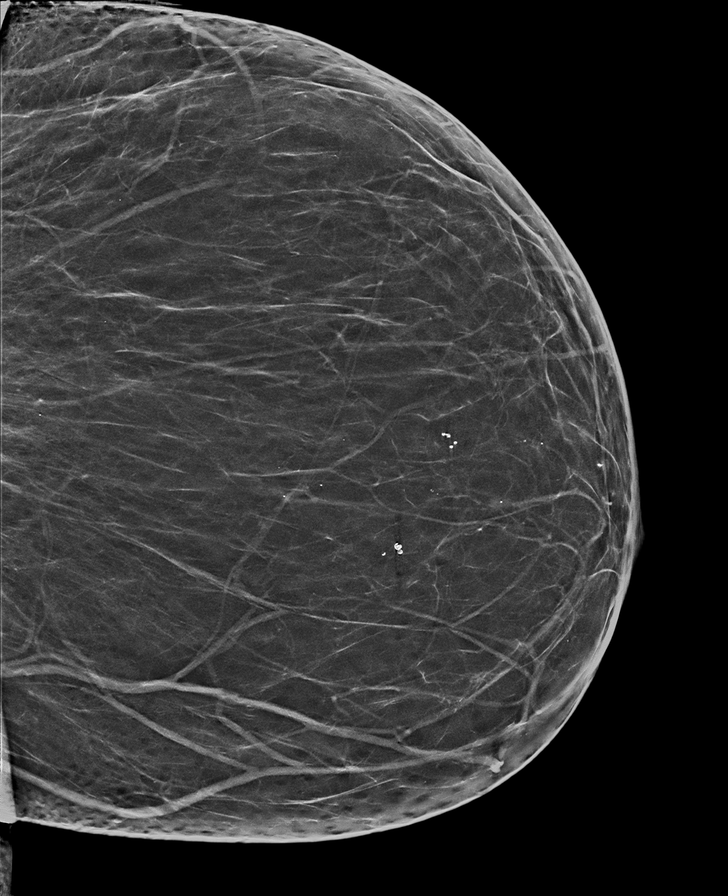

[R CC synth-2D (1 of 2)]
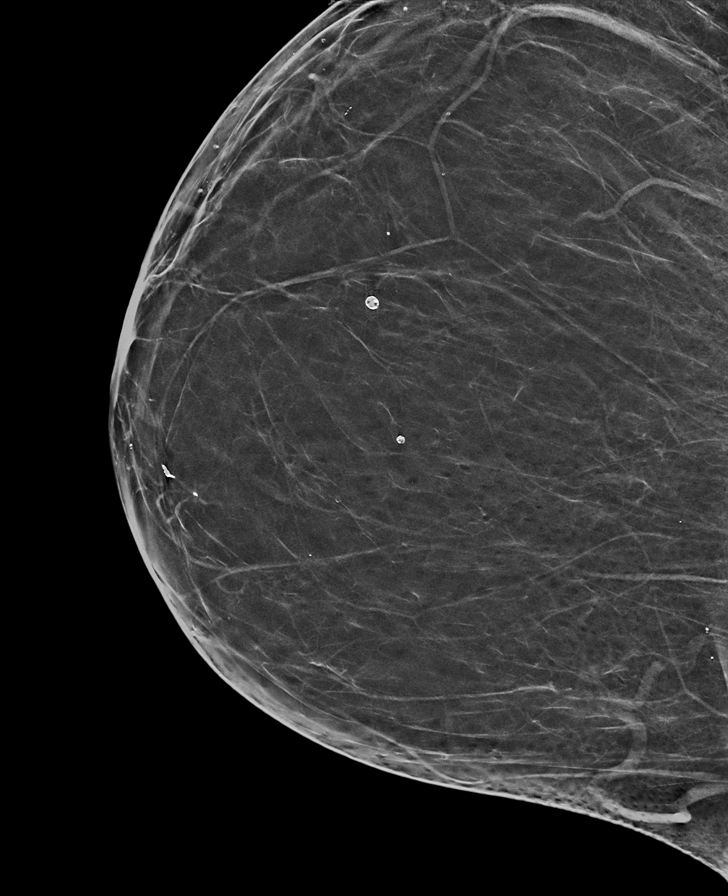

[R MLO synth-2D]
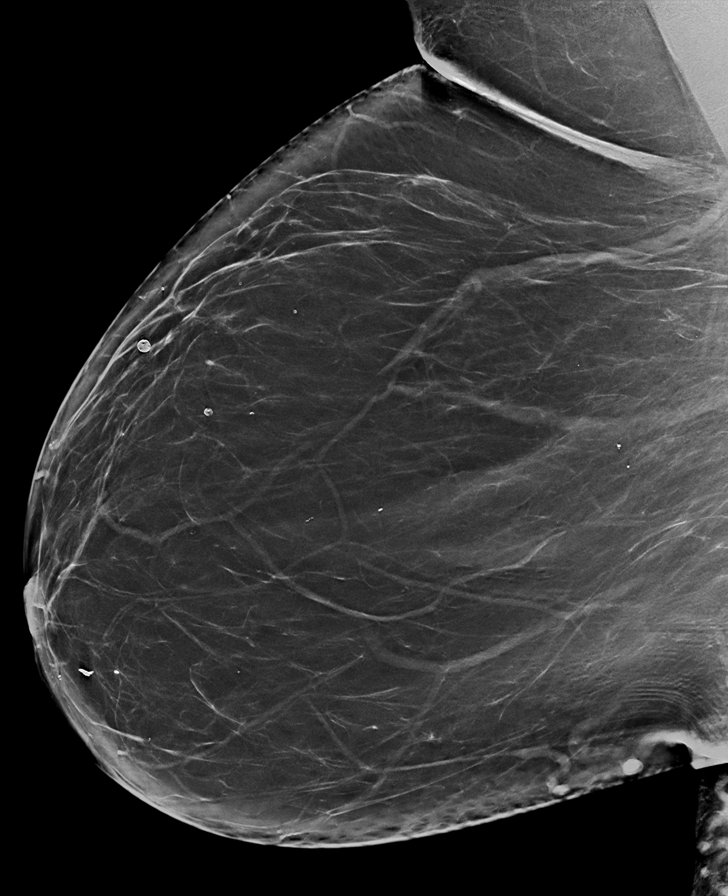

[L MLO synth-2D (2 of 2)]
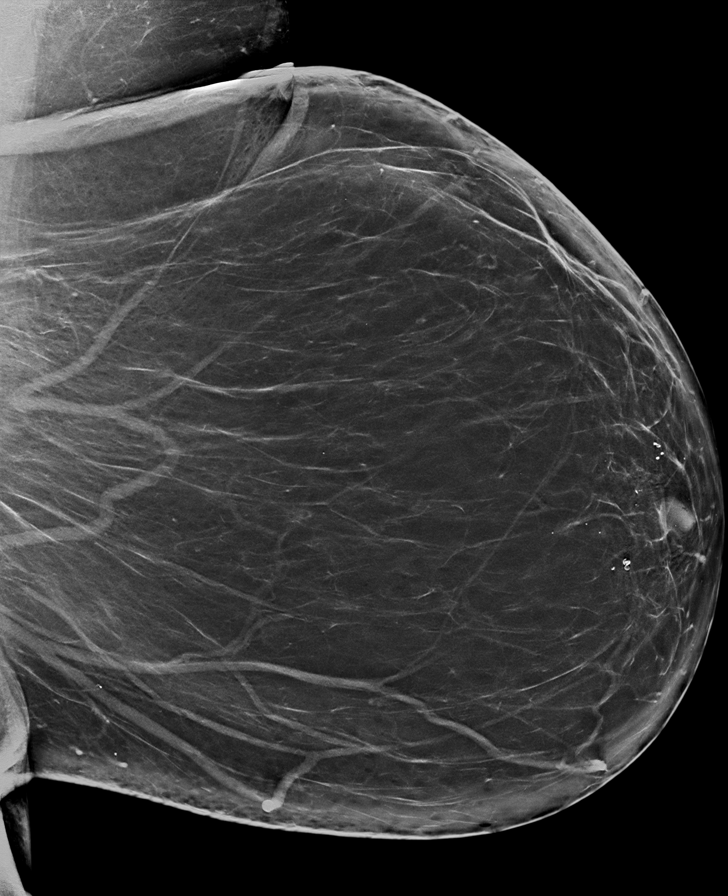

[L CC synth-2D (2 of 2)]
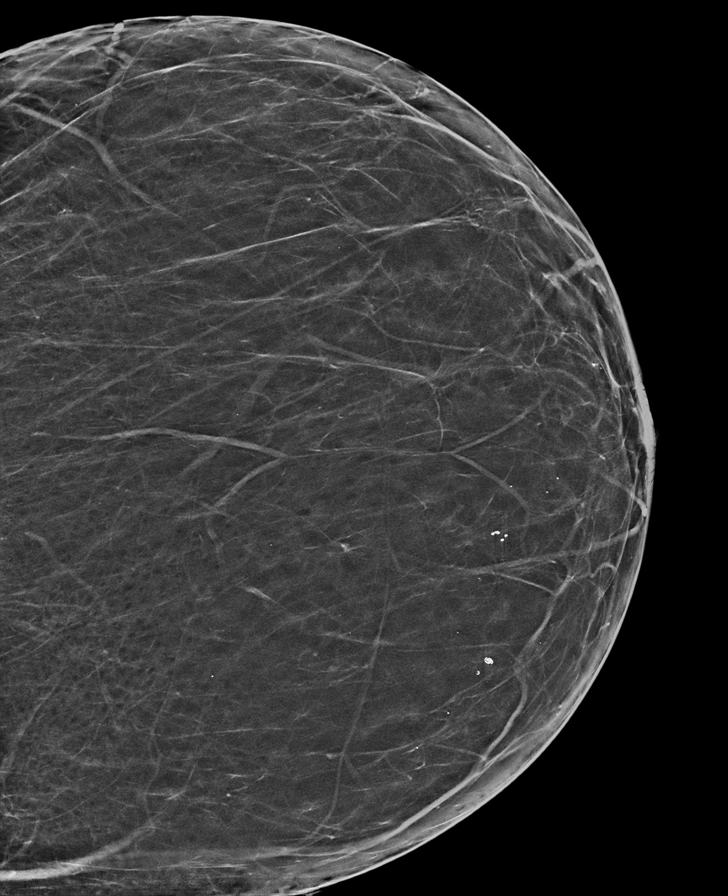

[R CC synth-2D (2 of 2)]
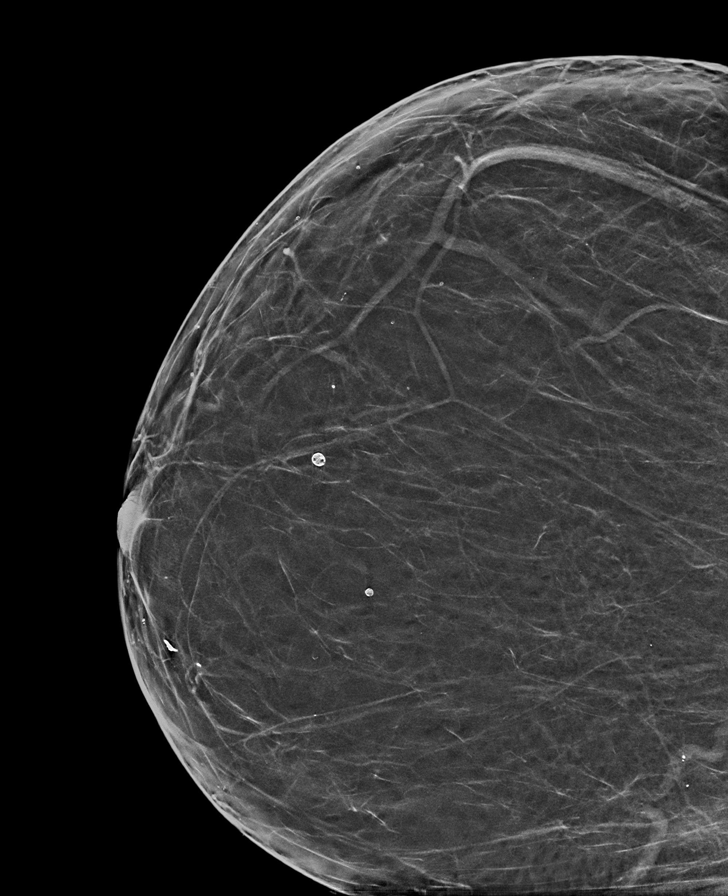

[R MLO tomo · tomo slice 66/96.0]
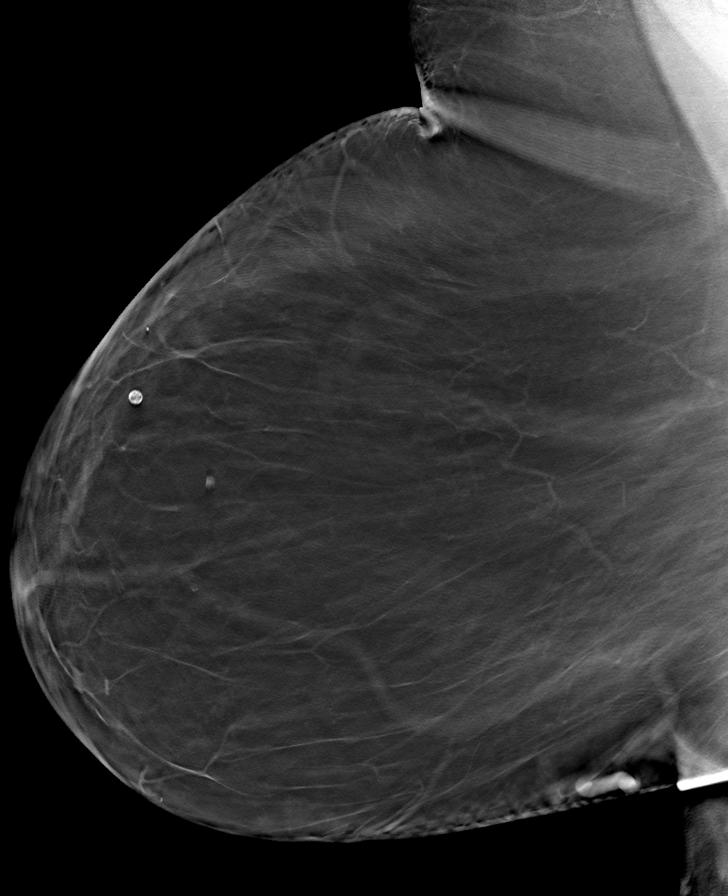

[8 of 40 positions shown; findings below may reference images not displayed]

ACR Breast Density Category b: There are scattered areas of
fibroglandular density.
FINDINGS: There are no findings suspicious for malignancy. Images were
processed with CAD.
IMPRESSION: No mammographic evidence of malignancy. A result letter of this
screening mammogram will be mailed directly to the patient.

RECOMMENDATION:
Screening mammogram in one year. (Code:[TQ])

BI-RADS CATEGORY  1: Negative.

## 2020-03-08 DIAGNOSIS — M159 Polyosteoarthritis, unspecified: Secondary | ICD-10-CM | POA: Diagnosis not present

## 2020-03-08 DIAGNOSIS — R2689 Other abnormalities of gait and mobility: Secondary | ICD-10-CM | POA: Diagnosis not present

## 2020-03-27 DIAGNOSIS — E785 Hyperlipidemia, unspecified: Secondary | ICD-10-CM | POA: Diagnosis not present

## 2020-03-27 DIAGNOSIS — E1129 Type 2 diabetes mellitus with other diabetic kidney complication: Secondary | ICD-10-CM | POA: Diagnosis not present

## 2020-03-27 DIAGNOSIS — E1169 Type 2 diabetes mellitus with other specified complication: Secondary | ICD-10-CM | POA: Diagnosis not present

## 2020-03-27 DIAGNOSIS — R809 Proteinuria, unspecified: Secondary | ICD-10-CM | POA: Diagnosis not present

## 2020-03-27 DIAGNOSIS — E1159 Type 2 diabetes mellitus with other circulatory complications: Secondary | ICD-10-CM | POA: Diagnosis not present

## 2020-03-27 DIAGNOSIS — I152 Hypertension secondary to endocrine disorders: Secondary | ICD-10-CM | POA: Diagnosis not present

## 2020-04-07 DIAGNOSIS — M159 Polyosteoarthritis, unspecified: Secondary | ICD-10-CM | POA: Diagnosis not present

## 2020-04-07 DIAGNOSIS — R2689 Other abnormalities of gait and mobility: Secondary | ICD-10-CM | POA: Diagnosis not present

## 2020-05-08 DIAGNOSIS — R2689 Other abnormalities of gait and mobility: Secondary | ICD-10-CM | POA: Diagnosis not present

## 2020-05-08 DIAGNOSIS — M159 Polyosteoarthritis, unspecified: Secondary | ICD-10-CM | POA: Diagnosis not present

## 2020-06-07 DIAGNOSIS — R2689 Other abnormalities of gait and mobility: Secondary | ICD-10-CM | POA: Diagnosis not present

## 2020-06-07 DIAGNOSIS — M159 Polyosteoarthritis, unspecified: Secondary | ICD-10-CM | POA: Diagnosis not present

## 2020-06-09 DIAGNOSIS — G4733 Obstructive sleep apnea (adult) (pediatric): Secondary | ICD-10-CM | POA: Diagnosis not present

## 2020-07-08 DIAGNOSIS — M159 Polyosteoarthritis, unspecified: Secondary | ICD-10-CM | POA: Diagnosis not present

## 2020-07-08 DIAGNOSIS — R2689 Other abnormalities of gait and mobility: Secondary | ICD-10-CM | POA: Diagnosis not present

## 2020-07-09 DIAGNOSIS — E782 Mixed hyperlipidemia: Secondary | ICD-10-CM | POA: Diagnosis not present

## 2020-07-16 DIAGNOSIS — I10 Essential (primary) hypertension: Secondary | ICD-10-CM | POA: Diagnosis not present

## 2020-07-16 DIAGNOSIS — Z6841 Body Mass Index (BMI) 40.0 and over, adult: Secondary | ICD-10-CM | POA: Diagnosis not present

## 2020-07-16 DIAGNOSIS — B369 Superficial mycosis, unspecified: Secondary | ICD-10-CM | POA: Diagnosis not present

## 2020-07-16 DIAGNOSIS — G4733 Obstructive sleep apnea (adult) (pediatric): Secondary | ICD-10-CM | POA: Diagnosis not present

## 2020-07-16 DIAGNOSIS — N939 Abnormal uterine and vaginal bleeding, unspecified: Secondary | ICD-10-CM | POA: Diagnosis not present

## 2020-07-16 DIAGNOSIS — E1165 Type 2 diabetes mellitus with hyperglycemia: Secondary | ICD-10-CM | POA: Diagnosis not present

## 2020-07-16 DIAGNOSIS — E782 Mixed hyperlipidemia: Secondary | ICD-10-CM | POA: Diagnosis not present

## 2020-07-17 DIAGNOSIS — R809 Proteinuria, unspecified: Secondary | ICD-10-CM | POA: Diagnosis not present

## 2020-07-17 DIAGNOSIS — E1169 Type 2 diabetes mellitus with other specified complication: Secondary | ICD-10-CM | POA: Diagnosis not present

## 2020-07-17 DIAGNOSIS — E785 Hyperlipidemia, unspecified: Secondary | ICD-10-CM | POA: Diagnosis not present

## 2020-07-17 DIAGNOSIS — E1159 Type 2 diabetes mellitus with other circulatory complications: Secondary | ICD-10-CM | POA: Diagnosis not present

## 2020-07-17 DIAGNOSIS — I152 Hypertension secondary to endocrine disorders: Secondary | ICD-10-CM | POA: Diagnosis not present

## 2020-07-17 DIAGNOSIS — E1129 Type 2 diabetes mellitus with other diabetic kidney complication: Secondary | ICD-10-CM | POA: Diagnosis not present

## 2020-07-30 DIAGNOSIS — N950 Postmenopausal bleeding: Secondary | ICD-10-CM | POA: Diagnosis not present

## 2020-08-08 DIAGNOSIS — M159 Polyosteoarthritis, unspecified: Secondary | ICD-10-CM | POA: Diagnosis not present

## 2020-08-08 DIAGNOSIS — R2689 Other abnormalities of gait and mobility: Secondary | ICD-10-CM | POA: Diagnosis not present

## 2020-08-10 ENCOUNTER — Other Ambulatory Visit: Payer: Self-pay | Admitting: Obstetrics & Gynecology

## 2020-08-10 DIAGNOSIS — N950 Postmenopausal bleeding: Secondary | ICD-10-CM

## 2020-08-11 DIAGNOSIS — E119 Type 2 diabetes mellitus without complications: Secondary | ICD-10-CM | POA: Diagnosis not present

## 2020-08-14 ENCOUNTER — Other Ambulatory Visit: Payer: Self-pay

## 2020-08-14 ENCOUNTER — Ambulatory Visit
Admission: RE | Admit: 2020-08-14 | Discharge: 2020-08-14 | Disposition: A | Discharge status: Home / Self Care | Payer: HMO | Source: Ambulatory Visit | Attending: Obstetrics & Gynecology | Admitting: Obstetrics & Gynecology

## 2020-08-14 DIAGNOSIS — N950 Postmenopausal bleeding: Secondary | ICD-10-CM | POA: Diagnosis not present

## 2020-08-14 DIAGNOSIS — C541 Malignant neoplasm of endometrium: Secondary | ICD-10-CM | POA: Diagnosis not present

## 2020-08-14 DIAGNOSIS — N939 Abnormal uterine and vaginal bleeding, unspecified: Secondary | ICD-10-CM | POA: Diagnosis not present

## 2020-08-14 IMAGING — US US PELVIS COMPLETE WITH TRANSVAGINAL
1 series · 15 of 25 positions shown · non-contrast
Comparison: None

CLINICAL DATA: Postmenopausal bleeding

EXAM:
TRANSABDOMINAL AND TRANSVAGINAL ULTRASOUND OF PELVIS
TECHNIQUE: Both transabdominal and transvaginal ultrasound examinations of the
pelvis were performed. Transabdominal technique was performed for
global imaging of the pelvis including uterus, ovaries, adnexal
regions, and pelvic cul-de-sac. It was necessary to proceed with
endovaginal exam following the transabdominal exam to visualize the
uterus endometrium adnexa.

[Series 1: us pelvis complete · 15 of 72 slices shown]
[im 1/72]
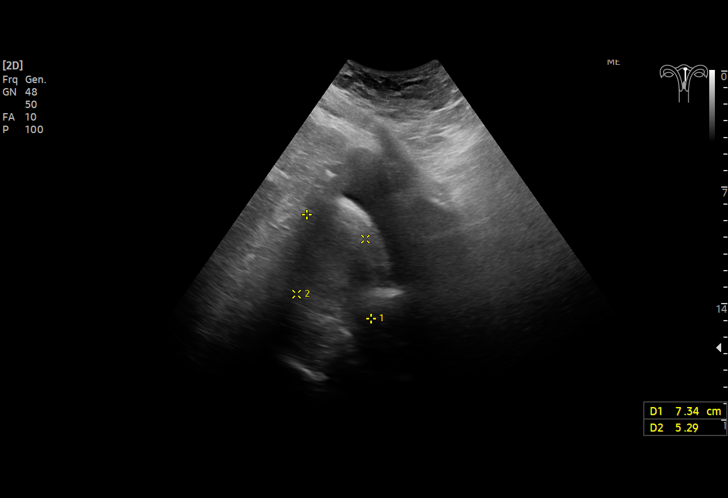
[im 6/72]
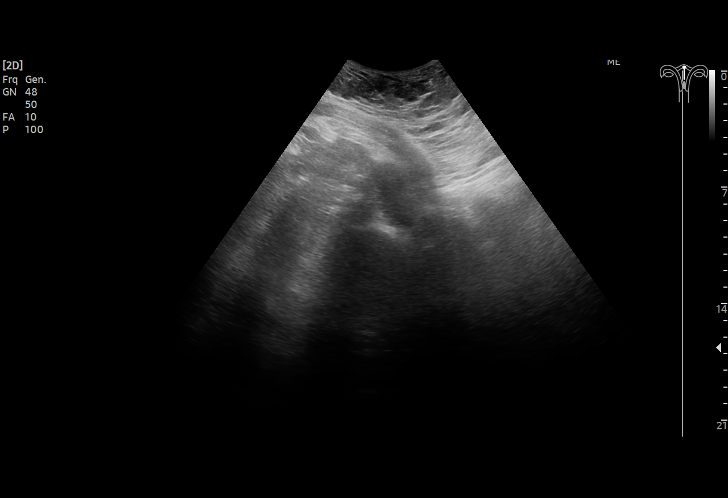
[im 12/72]
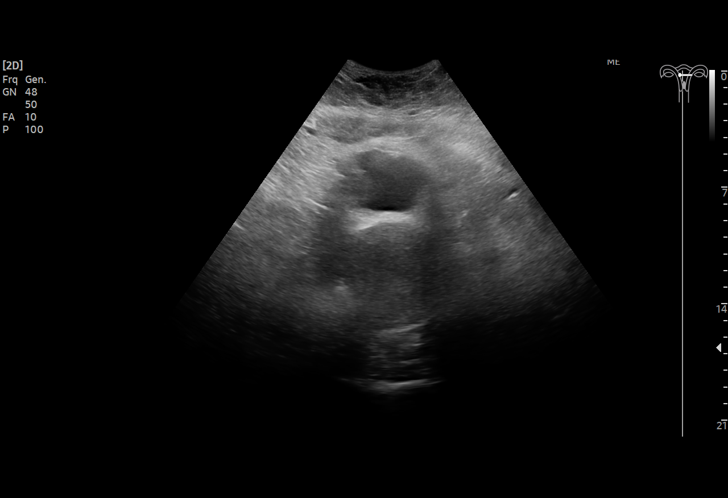
[im 15/72]
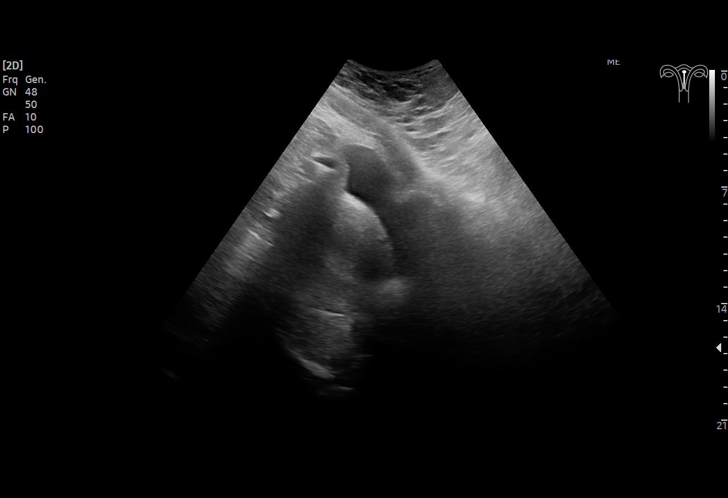
[im 21/72]
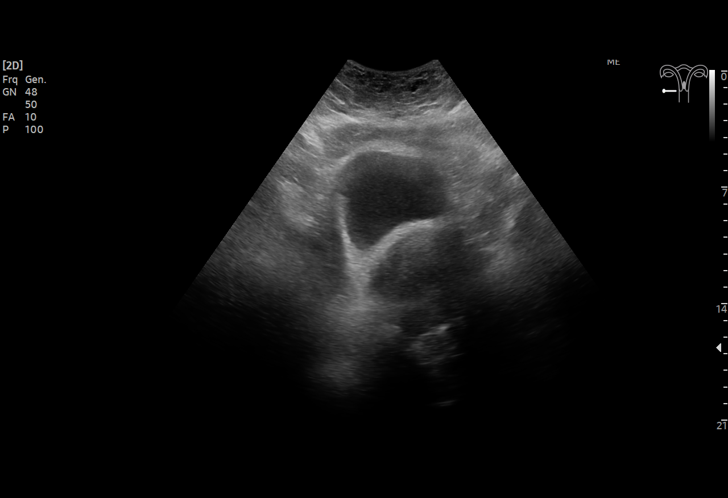
[im 27/72]
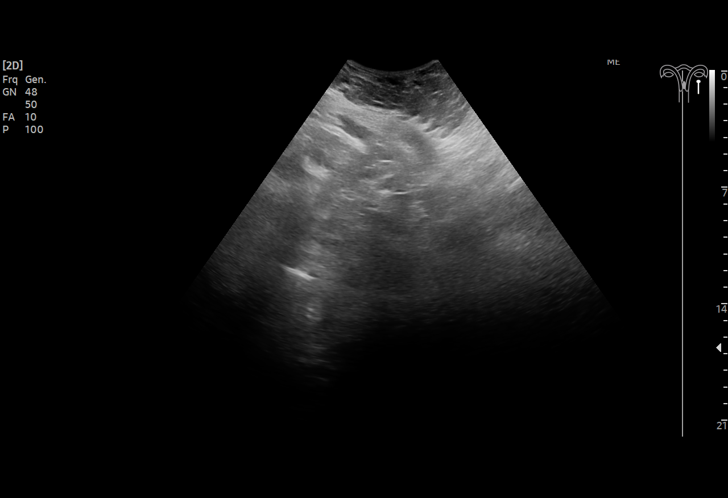
[im 30/72]
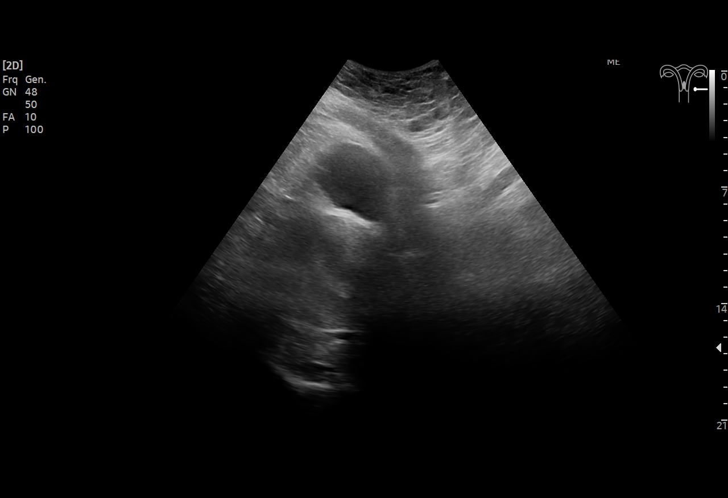
[im 36/72]
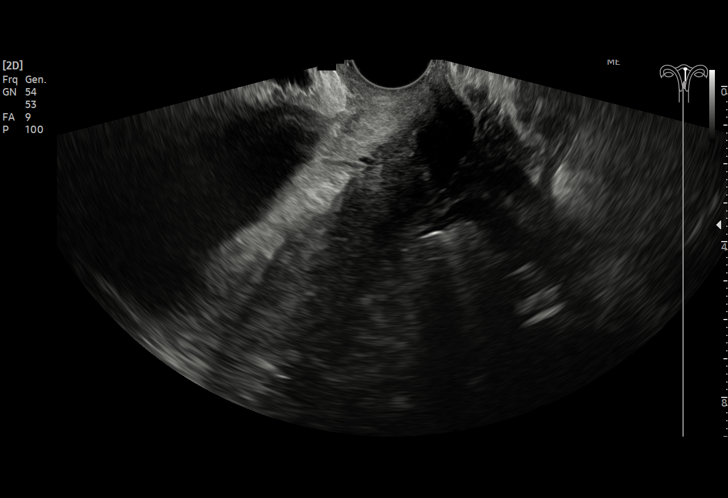
[im 42/72]
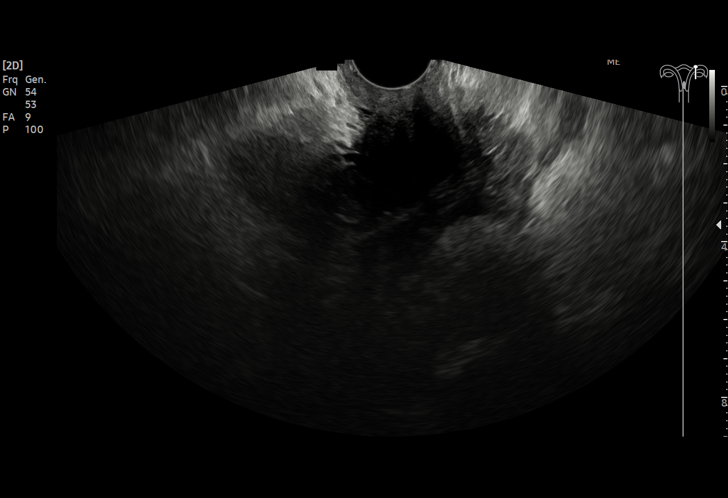
[im 45/72]
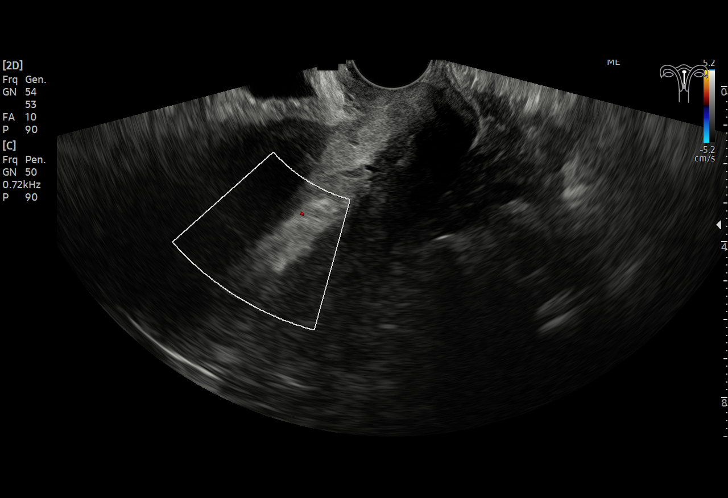
[im 51/72]
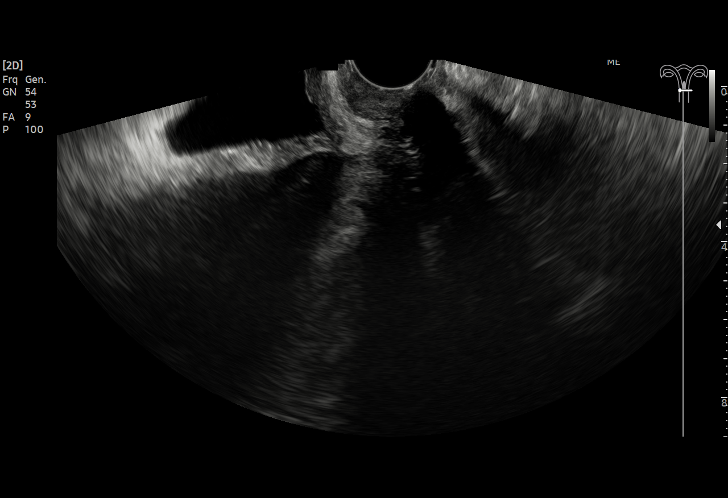
[im 57/72]
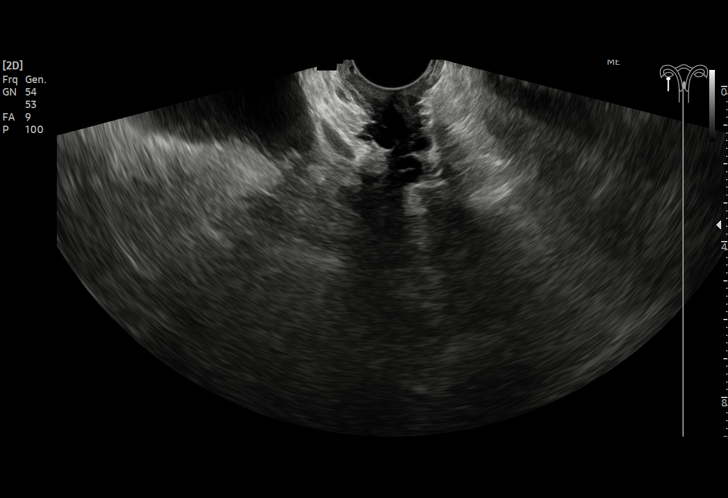
[im 60/72]
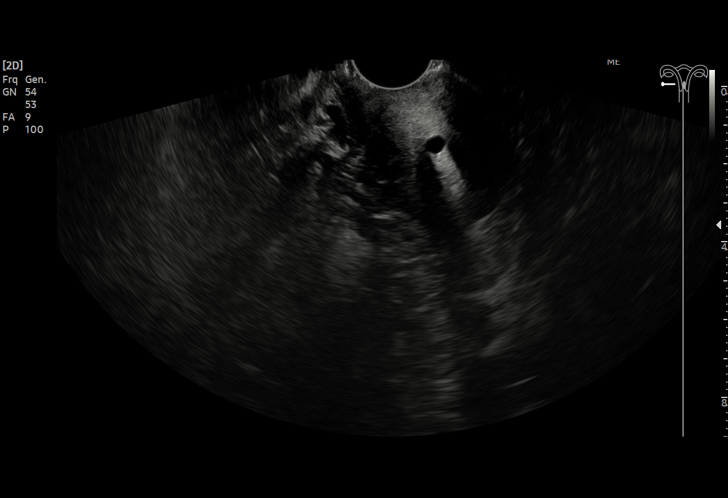
[im 66/72]
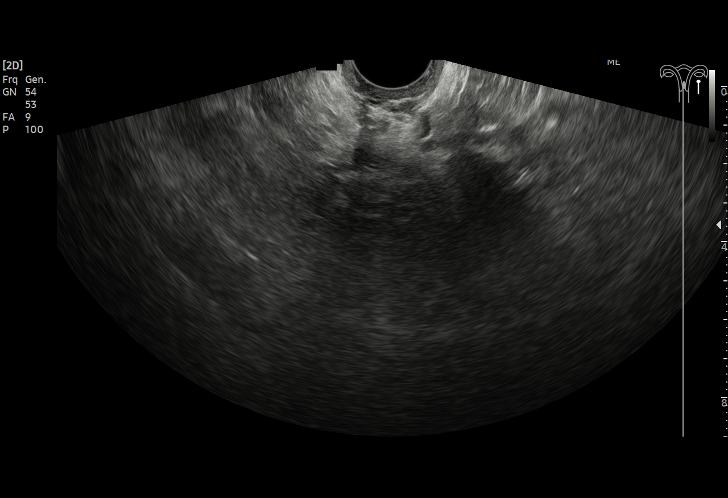
[im 72/72]
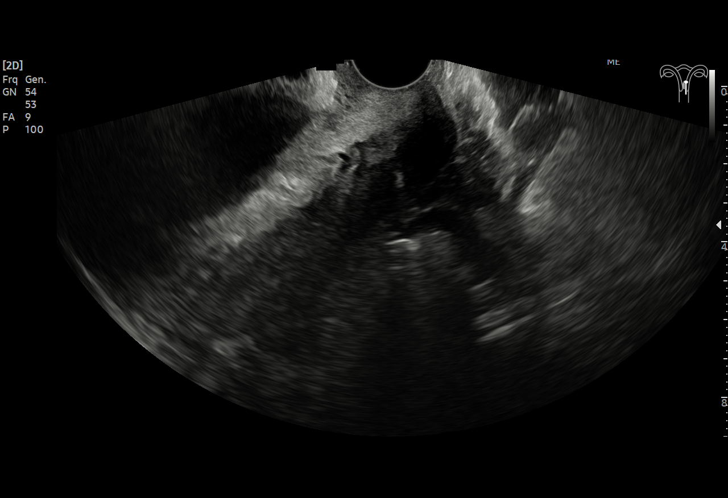

[15 of 25 positions shown; findings below may reference images not displayed]

FINDINGS: Uterus

Measurements: 7.7 x 5.3 x 5.8 cm = volume: 123 mL. No fibroids or
other mass visualized.

Endometrium

Thickness: 10 mm.  No focal abnormality visualized.

Right ovary

Nonvisualized.

Left ovary

Nonvisualized.

Other findings

No abnormal free fluid.
IMPRESSION: 1. Endometrial thickness of 10 mm. In the setting of post-menopausal
bleeding, endometrial sampling is indicated to exclude carcinoma. If
results are benign, sonohysterogram should be considered for focal
lesion work-up. (Ref: Radiological Reasoning: Algorithmic Workup of
Abnormal Vaginal Bleeding with Endovaginal Sonography and
Sonohysterography. AJR [1K]; 191:S68-73)
2. Nonvisualized ovaries

## 2020-08-21 ENCOUNTER — Telehealth: Payer: Self-pay

## 2020-08-21 NOTE — Telephone Encounter (Signed)
Referral received from Dr. Leonides Schanz to evaluate endometrial cancer. Spoke with Kathryn Maddox and appointment arranged for 08/26/20 at 1315. Went over what to expect during consults. Provided directions to the cancer center.

## 2020-08-26 ENCOUNTER — Encounter: Payer: Self-pay | Admitting: Family Medicine

## 2020-08-26 ENCOUNTER — Inpatient Hospital Stay: Payer: HMO | Attending: Obstetrics and Gynecology | Admitting: Obstetrics and Gynecology

## 2020-08-26 VITALS — BP 173/70 | HR 74 | Temp 98.2°F | Resp 20 | Wt 389.0 lb

## 2020-08-26 DIAGNOSIS — E785 Hyperlipidemia, unspecified: Secondary | ICD-10-CM | POA: Insufficient documentation

## 2020-08-26 DIAGNOSIS — G4730 Sleep apnea, unspecified: Secondary | ICD-10-CM | POA: Diagnosis not present

## 2020-08-26 DIAGNOSIS — Z7984 Long term (current) use of oral hypoglycemic drugs: Secondary | ICD-10-CM | POA: Diagnosis not present

## 2020-08-26 DIAGNOSIS — E1165 Type 2 diabetes mellitus with hyperglycemia: Secondary | ICD-10-CM | POA: Insufficient documentation

## 2020-08-26 DIAGNOSIS — C541 Malignant neoplasm of endometrium: Secondary | ICD-10-CM | POA: Diagnosis not present

## 2020-08-26 DIAGNOSIS — Z6841 Body Mass Index (BMI) 40.0 and over, adult: Secondary | ICD-10-CM | POA: Insufficient documentation

## 2020-08-26 DIAGNOSIS — I10 Essential (primary) hypertension: Secondary | ICD-10-CM | POA: Insufficient documentation

## 2020-08-26 DIAGNOSIS — Z79899 Other long term (current) drug therapy: Secondary | ICD-10-CM | POA: Insufficient documentation

## 2020-08-26 HISTORY — DX: Malignant neoplasm of endometrium: C54.1

## 2020-08-26 NOTE — Progress Notes (Addendum)
Gynecologic Oncology Consult Visit   Referring Provider: Dr. Vikki Ports Ward  PCP:  Dr. Ellison Hughs  Chief Concern: endometrial cancer  Subjective:  Kathryn Maddox is a 70 y.o. G12 female with multiple medical issues including morbid obesity, poorly controlled diabetes, and sleep apnea who is seen in consultation from Dr. Leonides Schanz for endometrial cancer.    She has a long history of abnormal vaginal bleeding and menorrhagia and anemia due to chronic blood loss going back to her 30's. She had a syncopal episode from losing so much blood. In [place], had 9 D&Cs with hysteroscopy, last one was in 2010, and bleeding always continued. She was treated with progesterone injections to stop bleeding. In '92 she had precancerous cells in uterus and was seeing oncologist for this. Was supposed to get a hysterectomy with oncologist, but had a bowel obstruction and surgery was denied until she was recovered. Based on what she described she had a ventral hernia that was repaired and she does not know if bowel was resected or not. She has not been to gynecologist since 2011 until she was seen by Dr. Leonides Schanz 07/30/2020. She has had postmenopausal bleeding x 7-8 years. She does not have a h/o of vWD and she did not have abnormal bleeding with any of her surgical procedures.   Her work up has included pelvic US, EMBx, and labs.  We do no have the [place] results.   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 Leonides Schanz that was negative.   Problem List: Patient Active Problem List   Diagnosis Date Noted  . Endometrial cancer ([place]) 08/26/2020    Past Medical History: Past Medical History:  Diagnosis Date  . Carpal tunnel syndrome on both sides 03/2018  . Cellulitis and abscess of lower extremity 03/2018   bilateral lower extrems, treated with antibx x 10 days  . Diabetes mellitus without complication ([place])   .  Dyspnea 03/2018   with any exercise, due to weight  . Eczema   . Endometrial cancer ([place]) 08/26/2020  . Hyperlipidemia   . Hypertension   . Morbid obesity with BMI of 70 and over, adult ([place]) 03/2018  . Sleep apnea 03/2018   uses a cpap    Past Surgical History: Past Surgical History:  Procedure Laterality Date  . CARPAL TUNNEL RELEASE Left 04/12/2018   Procedure: CARPAL TUNNEL RELEASE;  Surgeon: Hessie Knows, MD;  Location: [facility] ORS;  Service: Orthopedics;  Laterality: Left;  . COLONOSCOPY WITH PROPOFOL N/A 12/02/2019   Procedure: COLONOSCOPY WITH PROPOFOL;  Surgeon: Toledo, Benay Pike, MD;  Location: [facility] ENDOSCOPY;  Service: Gastroenterology;  Laterality: N/A;  . DILATION AND CURETTAGE OF UTERUS  03/2018   has had 10 d & c's with hysteroscopy  . HERNIA REPAIR  8588   umbilical w/ obstructed bowel  . LIPOMA EXCISION Right 1997   shoulder  . obstructive bowel surgery  01/2010    Past Gynecologic History:  Menarche: 12 Menstrual details: 7 Menses regular:No  Long h/o abnormal vaginal bleeding Last Menstrual Period: unknown HHistory of Abnormal pap:No Last pap: Unknown - need to verify obtained by Dr. Leonides Schanz   OB History: GOP0 OB History  No obstetric history on file.    Family History: Family History  Problem Relation Age of Onset  . Breast cancer Neg Hx     Social History: Social History   Socioeconomic History  .  Marital status: Divorced    Spouse name: Not on file  . Number of children: Not on file  . Years of education: Not on file  . Highest education level: Not on file  Occupational History  . 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 Smoking status: Never Smoker  . Smokeless tobacco: Never Used  Vaping Use  . Vaping Use: Never used  Substance and Sexual Activity  . Alcohol use: Yes    Comment: rarely  . Drug use: Never  . Sexual activity: Not Currently  Other Topics Concern  . Not on  file  Social History Narrative  . Not on file   Social Determinants of Health   Financial Resource Strain: Not on file  Food Insecurity: Not on file  Transportation Needs: Not on file  Physical Activity: Not on file  Stress: Not on file  Social Connections: Not on file  Intimate Partner Violence: Not on file    Allergies: Allergies  Allergen Reactions  . Megace [Megestrol] Rash  . Penicillins Rash and Other (See Comments)    Has patient had a PCN reaction causing immediate rash, facial/tongue/throat swelling, SOB or lightheadedness with hypotension: Unknown Has patient had a PCN reaction causing severe rash involving mucus membranes or skin necrosis: Unknown Has patient had a PCN reaction that required hospitalization: Unknown Has patient had a PCN reaction occurring within the last 10 years: No If all of the above answers are "NO", then may proceed with Cephalosporin use.   . Pravastatin Other (See Comments)    Muscle pain, extreme constipation    Current Medications: Current Outpatient Medications  Medication Sig Dispense Refill  . acetaminophen (TYLENOL) 650 MG CR tablet Take 1,300 mg by mouth every 8 (eight) hours as needed for pain.    Marland Kitchen losartan (COZAAR) 50 MG tablet Take 50 mg by mouth daily.    . rosuvastatin (CRESTOR) 5 MG tablet Take 5 mg by mouth daily.    . TRULICITY 0.81 KG/8.1EH SOPN     . Alpha-Lipoic Acid 200 MG TABS Take 200 mg by mouth 3 (three) times daily. (Patient not taking: No sig reported)    . HYDROcodone-acetaminophen (NORCO) 5-325 MG tablet Take 1 tablet by mouth every 4 (four) hours as needed for moderate pain. (Patient not taking: No sig reported) 20 tablet 0  . metFORMIN (GLUCOPHAGE-XR) 500 MG 24 hr tablet Take 500 mg by mouth daily. (Patient not taking: Reported on 08/26/2020)    . sitaGLIPtin (JANUVIA) 100 MG tablet Take 100 mg by mouth daily. (Patient not taking: Reported on 08/26/2020)     No current facility-administered medications for this  visit.    Review of Systems General: negative for fevers, changes in weight or night sweats Skin: negative for changes in moles or sores or rash Eyes: negative for changes in vision HEENT: tinnitus o/w negative for change in hearing, voice changes Pulmonary: negative for dyspnea, orthopnea, productive cough, wheezing Cardiac: negative for palpitations, pain Gastrointestinal: negative for nausea, vomiting, constipation, diarrhea, hematemesis, hematochezia Genitourinary/Sexual: negative for dysuria, retention, hematuria, incontinence Ob/Gyn:  As per HPI Musculoskeletal: negative for pain, back pain Hematology: joint pain o/w negative for easy bruising, abnormal bleeding Neurologic/Psych: numbness fingers o/w negative for headaches, seizures, paralysis, weakness   Objective:  Physical Examination:  BP (!) 173/70   Pulse 74   Temp 98.2 F (36.8 C)   Resp 20   Wt (!) 389 lb (176.4 kg)  SpO2 100%   BMI 73.50 kg/m    ECOG Performance Status: 1 - Symptomatic but completely ambulatory  GENERAL: Morbidly obese female in no acute distress who arrived in a wheel chair HEENT:  PERRL, neck supple with midline trachea. Thyroid without masses.  NODES:  No cervical, supraclavicular, axillary, or inguinal lymphadenopathy palpated.  LUNGS:  Clear to auscultation bilaterally.   HEART:  Regular rate and rhythm.  ABDOMEN:  Large panniculus. Soft, nontender, nondistended and no hernia. No obvious masses but exam limited by habitus.  MSK:  No focal spinal tenderness to palpation. Full range of motion bilaterally in the upper extremities. EXTREMITIES:  Bilateral 2+ peripheral edema.   SKIN:  Clear with no obvious rashes or skin changes. No nail dyscrasia. NEURO:  Nonfocal. Well oriented.  Appropriate affect.  Pelvic chaperoned by nurse;  Vulva: normal appearing vulva with no masses, tenderness or lesions; Vagina: normal vagina and no bleeding. Cervix nulliparous and no lesions. Bimanual: no  masses, unable to determine uterine size due to habitus. Pelvic exam required flexing thighs onto the abdomen to obtain exposure and see the cervix.   Lab Review Labs on site today: None  Radiologic Imaging: [place] results requested  MRI ordered    Assessment:  Haeli Gerlich is a 70 y.o. female diagnosed with grade 1  endometrial cancer.   Medical co-morbidities complicating care: HTN, diabetes and obesity, and sleep apnea Plan:   Problem List Items Addressed This Visit      Genitourinary   Endometrial cancer ([place]) - Primary      We discussed options for management including surgery, radiation, and hormonal therapy. She has significant medical issues including morbid obesity, poorly controlled diabetes, and sleep apnea.   A long discussion was held about management options for invasive uterine adenocarcinoma in a morbidly obese patient. There are benefits and risks to each approach. She may not be a candidate for surgery and her glycemic control needs to be optimized.  Hormonal management with progestins or an aromatase inhibitor can stop the bleeding and control the cancer in up to two-thirds of cases.  Repeat endometrial sampling over time would be required to assess the response to hormonal therapy.  The patient understands that there is a significant risk of about 1 in 3 of persistent cancer despite hormonal treatment as well as a small risk of developing metastatic disease despite hormonal treatment.  These risks must be weighed against the risks of anesthesia and surgery in this patient with morbid obesity and other medical co-morbidities.      Obtain pelvic MRI to determine extent of invasion. Optimize glycemic control and other medical issues. We will reach out to her PCP, Dr. Ellison Hughs, and her endocrinologist, Dr. Honor Junes. Reassess after the MRI to determine our final recommendations.   Suggested return to clinic after the MRI. We will also obtain a PT/PTT at that visit.    The patient's diagnosis, an outline of the further diagnostic and laboratory studies which will be required, the recommendation, and alternatives were discussed.  All questions were answered to the patient's satisfaction.  A total of 85 minutes were spent with the patient/family today; >50% was spent in education, counseling and coordination of care for endometrial cancer.    Theresa Wedel [name], MD  ADDENDUM:  Pelvic US 08/14/2020 at [facility] FINDINGS: Uterus  Measurements: 7.7 x 5.3 x 5.8 cm = volume: 123 mL. No fibroids or other mass visualized.  Endometrium  Thickness: 10 mm.  No focal abnormality visualized.  Right  ovary  Nonvisualized.  Left ovary  Nonvisualized.  Other findings  No abnormal free fluid.  IMPRESSION: 1. Endometrial thickness of 10 mm. In the setting of post-menopausal bleeding, endometrial sampling is indicated to exclude carcinoma. If results are benign, sonohysterogram should be considered for focal lesion work-up. (Ref: Radiological Reasoning: Algorithmic Workup of Abnormal Vaginal Bleeding with Endovaginal Sonography and Sonohysterography. AJR 2008; 388:E28-00)  2. Nonvisualized ovaries  Laporche Martelle [name], MD

## 2020-08-26 NOTE — Patient Instructions (Signed)
Uterine Cancer  Uterine cancer is an abnormal growth of cancer tissue in the womb (uterus). Uterine cancer can spread to other parts of the body. Uterine cancer usually occurs after a woman stops having menstrual periods (menopause). However, it may also occur around the time that menopause starts. The wall of the uterus has an inner layer of tissue called the endometrium and an outer layer of muscle tissue called the myometrium. The most common type of uterine cancer is endometrial cancer, and it starts in the endometrium. Cancer that starts in the myometrium (uterine sarcoma) is very rare. What are the causes? The exact cause of this condition is not known. What increases the risk? You are more likely to develop this condition if you:  Are older than 50.  Have a thicker than normal endometrium (endometrial hyperplasia).  Have never been pregnant or cannot have children.  Started menstruating when you were younger than 70 years old or are older than 44 and having menstrual periods.  Have a history of cancer, including: ? Cancer of the ovaries, or cancer of the intestines, colon, or rectum (colorectal cancer). ? A family history of uterine cancer or hereditary nonpolyposis colon cancer.  Have a long-term (chronic) condition, such as: ? Diabetes. ? High blood pressure. ? Thyroid disease. ? Gallbladder disease. ? Obesity. ? A personal history of enlarged ovaries with small cysts (polycystic ovarian syndrome).  Have been exposed to: ? Radiation. ? Cigarette smoke. ? Hormone therapy or a medicine called tamoxifen. What are the signs or symptoms? Symptoms of this condition include:  Abnormal vaginal bleeding or discharge. Bleeding may start as a watery, blood-streaked flow that gradually contains more blood. This is the most common symptom. If you experience abnormal vaginal bleeding, do not assume that it is part of menopause.  Vaginal bleeding after menopause.  Unexplained weight  loss.  Bleeding between periods.  Urination that is difficult, painful, or more frequent than usual.  A lump in the vagina.  Pain, bloating, fullness in the abdomen, or pelvic pain. You may also have pain during sex. How is this diagnosed? This condition may be diagnosed based on your medical history, symptoms, a physical exam, and a pelvic exam. During the pelvic exam, your health care provider will feel your pelvis for any growths, as well as for any enlarged lymph nodes. You may also have tests, including:  Blood and urine tests.  Imaging tests, such as X-rays, CT scans, ultrasound, or MRIs.  Hysteroscopy. This involves inserting a thin, flexible tube with a light and camera on the end through the vagina to look inside the uterus.  A Pap test to check for abnormal cells in the lower part of the uterus (cervix) and the upper vagina.  Removal of a tissue sample (biopsy) from the uterine lining. The sample will be examined in a lab to check for cancer cells.  Dilation and curettage (D&C). This procedure involves stretching the cervix and scraping the lining of the uterus to get a tissue sample to check for cancer cells. The cancer is staged to determine its severity and extent. Staging is an assessment of the size of the tumor and if, or where, the cancer has spread. The stages of uterine cancer are:  Stage I. The cancer is only in the uterus.  Stage II. The cancer has spread to the cervix.  Stage III. The cancer has spread outside the uterus, but not outside the pelvis. The cancer may have spread to the lymph nodes in  the pelvis. Lymph nodes are part of your body's disease-fighting (immune) system.  Stage IV. The cancer has spread to other parts of the body, such as the bladder, rectum, and other organs. How is this treated? This condition is often treated with surgery to remove:  The uterus, cervix, fallopian tubes, and ovaries (total hysterectomy).  The uterus and cervix  (simple hysterectomy). The type of hysterectomy you will have depends on the extent of your cancer. Lymph nodes near the uterus may also be removed. Treatment may include one or more of the following:  Chemotherapy. These are medicines to kill cancer cells or to slow their growth. Chemotherapy also kills other normal, healthy cells, including blood-forming cells in the bone marrow, hair follicles, and cells in the mouth, digestive tract, and reproductive system.  Radiation therapy. This uses high-energy rays to kill cancer cells and prevent their spread.  Chemoradiation. This treatment alternates chemotherapy with radiation treatments to enhance the effects of radiation.  Brachytherapy. This involves placing small radioactive capsules inside the body where the cancer was removed.  Hormone therapy. This includes taking medicines that lower the levels of estrogen in the body.  Immunotherapy. These medicines help your immune system fight cancer.  Targeted therapy. These medicines specifically kill cancer cells. Follow these instructions at home: Medicines  Take over-the-counter and prescription medicines only as told by your health care provider.  Ask your health care provider if the medicine prescribed to you requires you to avoid driving or using machinery. Activity  Return to your normal activities as told by your health care provider. Ask your health care provider what activities are safe for you.  Get regular exercise. Aim for 30 minutes of moderate-intensity activity 5 times a week. Examples of moderate-intensity activity include walking and yoga. Be sure to talk with your health care provider before starting any exercise routine. Lifestyle  Eat a healthy diet. A healthy diet includes a lot of fruits and vegetables, low-fat dairy products, lean meats, and fiber.  Do not use any products that contain nicotine or tobacco. These products include cigarettes, chewing tobacco, and vaping  devices, such as e-cigarettes. If you need help quitting, ask your health care provider.  Consider joining a support group to help you cope with stress. Your health care provider may be able to recommend a local or online support group. General instructions  Drink enough fluid to keep your urine pale yellow.  Do not have sex, douche, or place anything in your vagina, such as a tampon or diaphragm, until your health care provider says that it is safe.  Work with your health care provider to: ? Manage any chronic conditions you have. ? Manage any side effects of your treatment.  Keep all follow-up visits. This is important. Where to find more information  American Cancer Society: www.cancer.[place]: www.cancer.gov Contact a health care provider if:  You have pain in your pelvis or abdomen that gets worse.  You cannot urinate.  You have abnormal vaginal bleeding.  You have a fever. Get help right away if:  You develop sudden or new severe symptoms, such as: ? Heavy bleeding. ? Severe weakness. ? Pain that is severe or does not get better with medicine. Summary  Uterine cancer is an abnormal growth of cancer tissue in the uterus. The most common type of uterine cancer starts in the endometrium and is called endometrial cancer.  This condition is often treated with surgery to remove the uterus, cervix, fallopian tubes,  and ovaries (total hysterectomy) or the uterus and cervix (simple hysterectomy).  Work with your health care provider to manage any long-term (chronic) conditions you have, such as diabetes, high blood pressure, thyroid disease, or gallbladder disease.  Consider joining a support group to help you cope with stress. Your health care provider may be able to recommend a local or online support group. This information is not intended to replace advice given to you by your health care provider. Make sure you discuss any questions you have with  your health care provider. Document Revised: 01/21/2020 Document Reviewed: 01/21/2020 Elsevier Patient Education  2021 [employer].

## 2020-08-27 NOTE — Progress Notes (Signed)
Messages sent to Dr. Ellison Hughs and Dr. Honor Junes requesting to optimize glycemic control and medical issues.

## 2020-08-28 ENCOUNTER — Telehealth: Payer: Self-pay

## 2020-08-28 NOTE — Telephone Encounter (Signed)
Per Dr. Honor Junes, Ms. Kathryn Maddox was not taking anything for her diabetes at her 07/17/20 office visit. He advised her to start the Trulicity that he had prescribed. She confirmed that she did start this medication.

## 2020-09-05 DIAGNOSIS — M159 Polyosteoarthritis, unspecified: Secondary | ICD-10-CM | POA: Diagnosis not present

## 2020-09-05 DIAGNOSIS — R2689 Other abnormalities of gait and mobility: Secondary | ICD-10-CM | POA: Diagnosis not present

## 2020-09-10 ENCOUNTER — Ambulatory Visit
Admission: RE | Admit: 2020-09-10 | Discharge: 2020-09-10 | Disposition: A | Discharge status: Home / Self Care | Payer: HMO | Source: Ambulatory Visit | Attending: Obstetrics and Gynecology | Admitting: Obstetrics and Gynecology

## 2020-09-10 ENCOUNTER — Other Ambulatory Visit: Payer: Self-pay

## 2020-09-10 DIAGNOSIS — Z8542 Personal history of malignant neoplasm of other parts of uterus: Secondary | ICD-10-CM | POA: Diagnosis not present

## 2020-09-10 DIAGNOSIS — N950 Postmenopausal bleeding: Secondary | ICD-10-CM | POA: Diagnosis not present

## 2020-09-10 DIAGNOSIS — C541 Malignant neoplasm of endometrium: Secondary | ICD-10-CM | POA: Insufficient documentation

## 2020-09-10 IMAGING — MR MR PELVIS WO/W CM
16 of 19 series · 40 of 48 positions shown · IV contrast (10ml Gadavist)
Comparison: Pelvic ultrasound dated [DATE]
COMPARISON: Pelvic ultrasound dated [DATE]

Addendum:
CLINICAL DATA: Abnormal Pap smear, postmenopausal bleeding/pain,
endometrial cancer

EXAM:
MRI PELVIS WITHOUT AND WITH CONTRAST
TECHNIQUE: Multiplanar multisequence MR imaging of the pelvis was performed
both before and after administration of intravenous contrast.
CONTRAST:  10mL GADAVIST GADOBUTROL 1 MMOL/ML IV SOLN

[Series 2: T2 · coronal · 5.0mm · 1.56mm/px · 1 of 35 slices shown (1 of 3)]
[im 1/35]
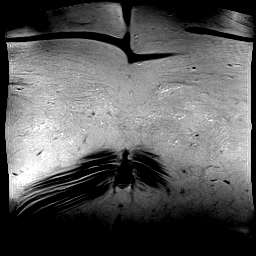

[Series 3: T2 · sagittal · 4.0mm · 0.88mm/px · 1 of 45 slices shown (2 of 3)]
[im 1/45]
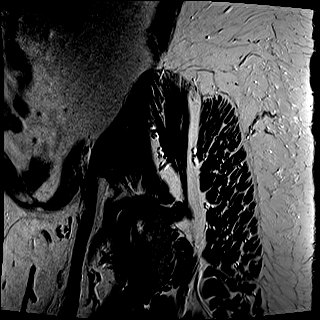

[Series 6: T2 fat-sat · axial · 4.0mm · 0.58mm/px · 1 of 40 slices shown]
[im 1/40]
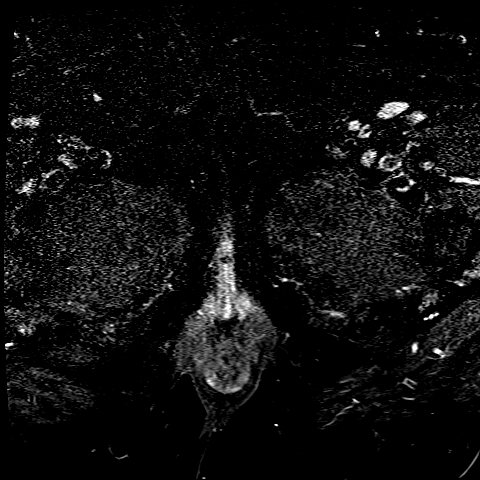

[Series 7: ax in&(date) · axial · 5.0mm · 0.68mm/px · z∈[-57,+165]mm · 2 of 38 slices shown (1 of 2)]
[im 1/38]
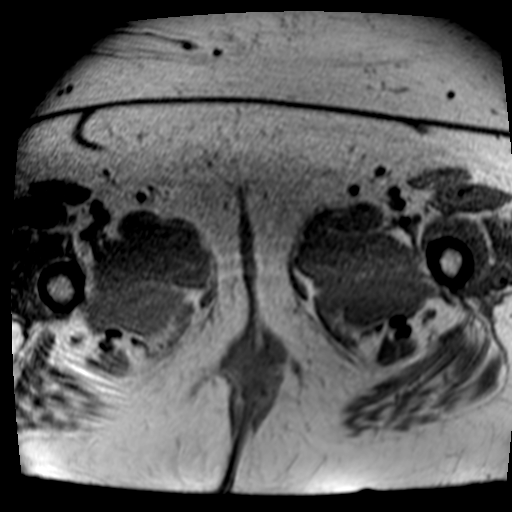
[im 38/38]
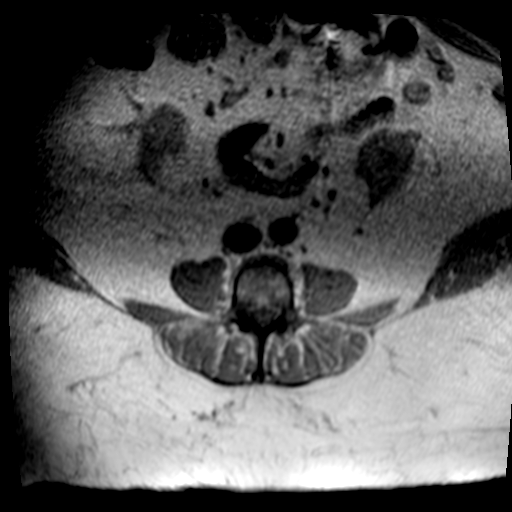

[Series 7: ax in&(date) · axial · 5.0mm · 0.68mm/px · z∈[-57,+165]mm · 2 of 38 slices shown (2 of 2)]
[im 1/38]
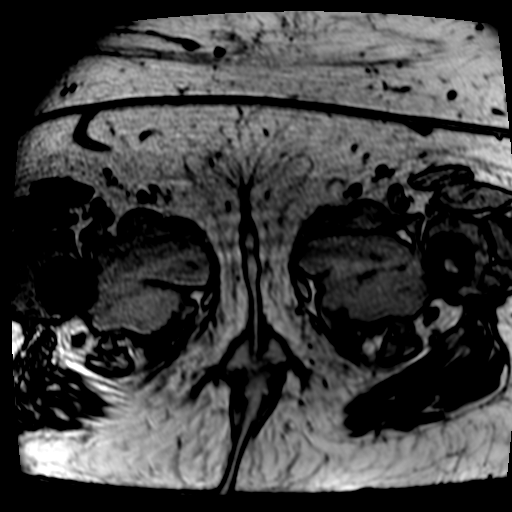
[im 38/38]
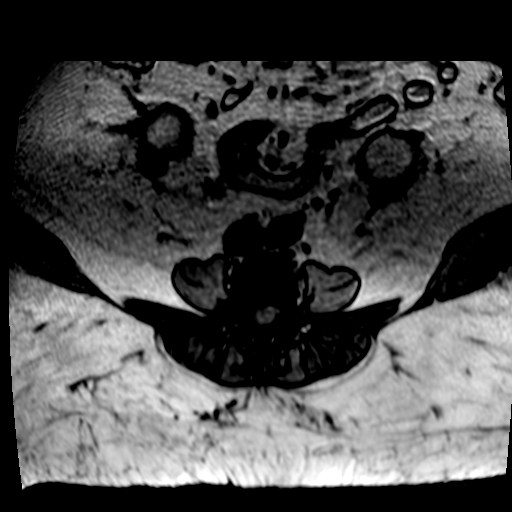

[Series 8: DWI · axial · 4.0mm · 1.42mm/px · z∈[-38,+147]mm · 5 of 114 slices shown]
[im 1/114]
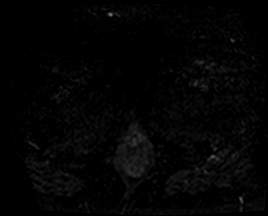
[im 29/114]
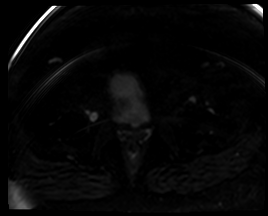
[im 57/114]
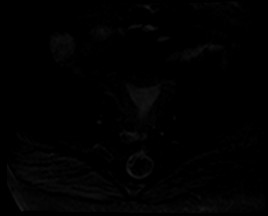
[im 85/114]
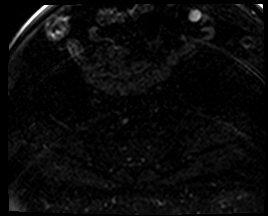
[im 114/114]
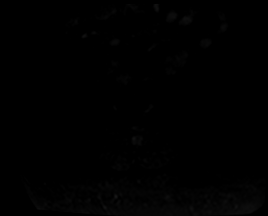

[Series 9: ax dwi_adc · axial · 4.0mm · 1.42mm/px · z∈[-38,+147]mm · 2 of 38 slices shown]
[im 1/38]
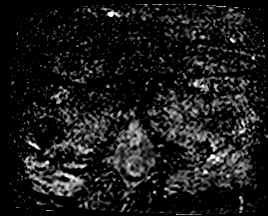
[im 38/38]
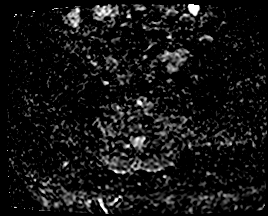

[Series 10: T2 · coronal · 3.0mm · 0.62mm/px · 2 of 47 slices shown (3 of 3)]
[im 1/47]
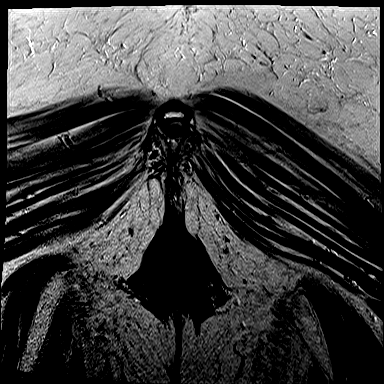
[im 47/47]
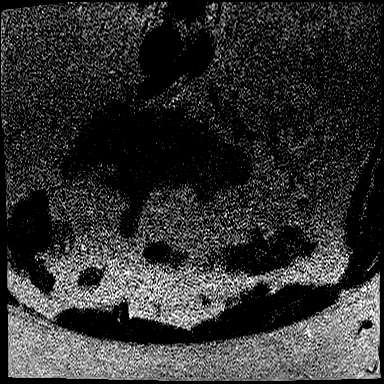

[Series 11: T1 fat-sat · coronal · non-contrast · 3.0mm · 0.88mm/px · 3 of 72 slices shown (1 of 7)]
[im 1/72]
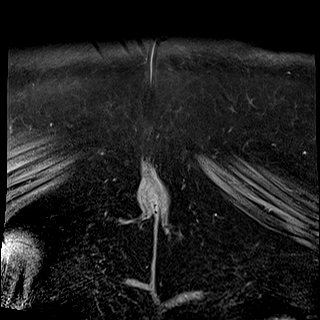
[im 36/72]
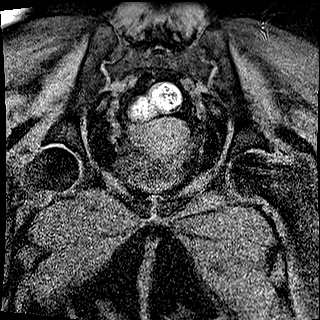
[im 72/72]
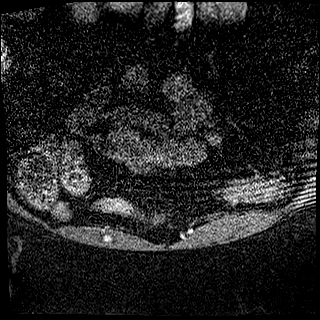

[Series 12: T1 fat-sat · coronal · 3.0mm · 0.88mm/px · 3 of 72 slices shown (2 of 7)]
[im 1/72]
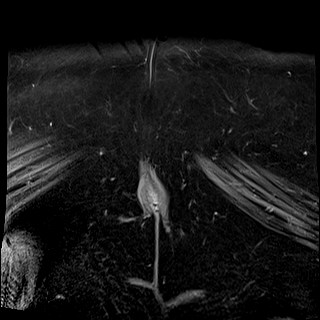
[im 36/72]
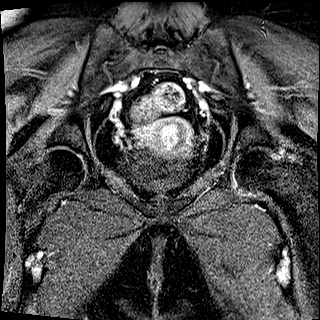
[im 72/72]
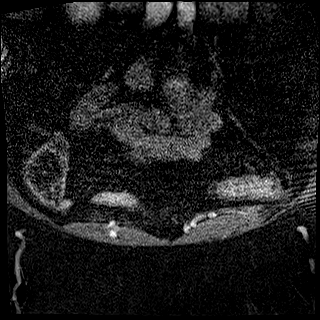

[Series 13: T1 fat-sat · coronal · 3.0mm · 0.88mm/px · 3 of 72 slices shown (3 of 7)]
[im 1/72]
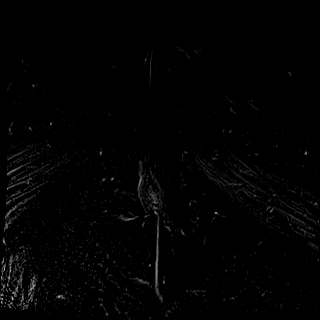
[im 36/72]
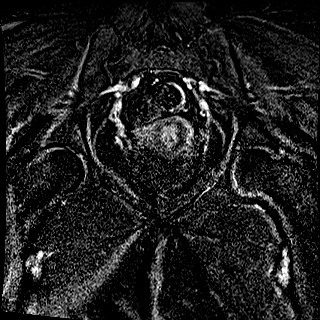
[im 72/72]
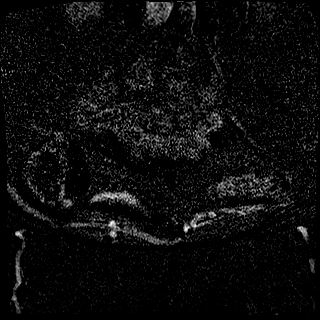

[Series 14: T1 fat-sat · coronal · 3.0mm · 0.88mm/px · 3 of 72 slices shown (4 of 7)]
[im 1/72]
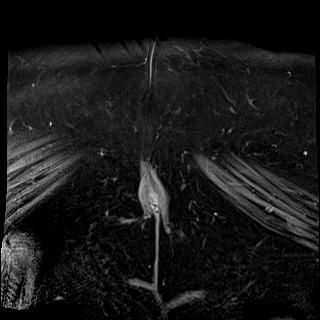
[im 36/72]
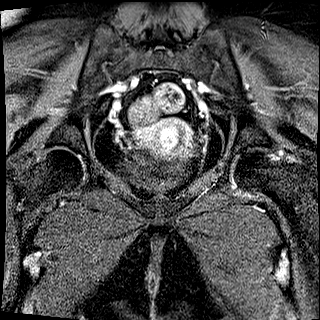
[im 72/72]
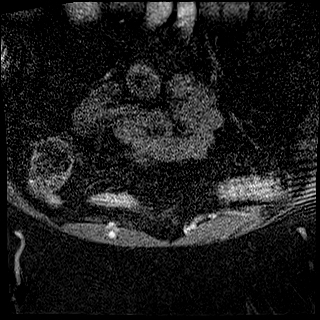

[Series 15: T1 fat-sat · coronal · 3.0mm · 0.88mm/px · 3 of 72 slices shown (5 of 7)]
[im 1/72]
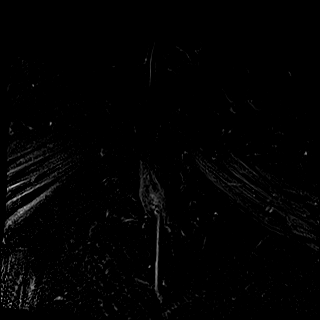
[im 36/72]
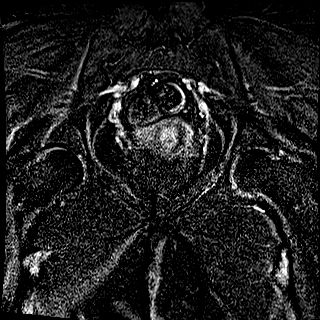
[im 72/72]
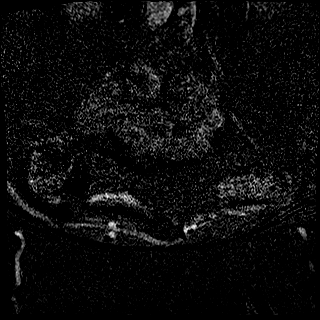

[Series 16: T1 fat-sat · coronal · 3.0mm · 0.88mm/px · 3 of 72 slices shown (6 of 7)]
[im 1/72]
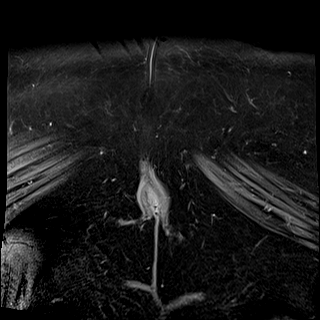
[im 36/72]
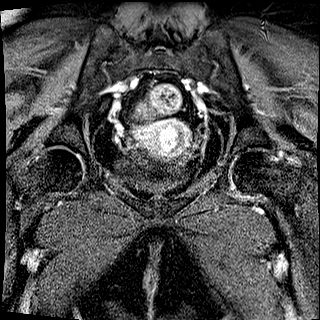
[im 72/72]
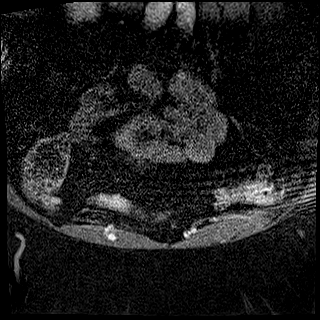

[Series 17: T1 fat-sat · coronal · 3.0mm · 0.88mm/px · 3 of 72 slices shown (7 of 7)]
[im 1/72]
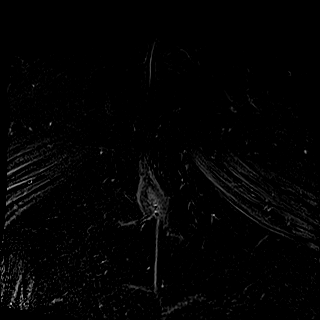
[im 36/72]
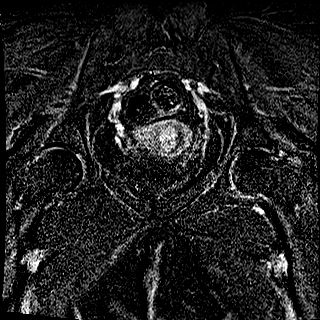
[im 72/72]
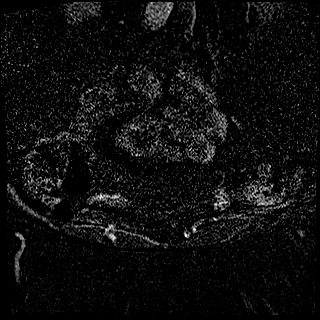

[Series 18: T1 fat-sat post-contrast · sagittal · 3.0mm · 0.88mm/px · 3 of 72 slices shown]
[im 1/72]
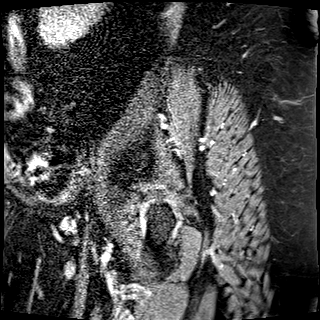
[im 36/72]
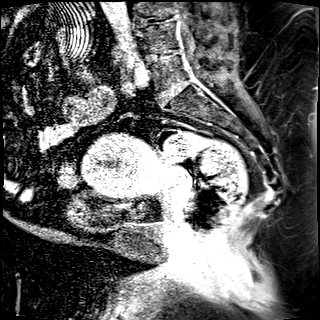
[im 72/72]
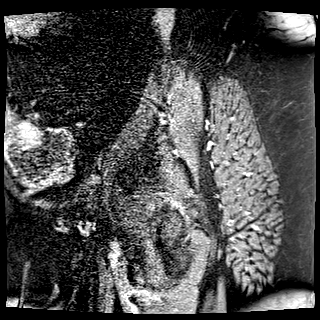

[40 of 48 positions shown; findings below may reference images not displayed]

FINDINGS: Urinary Tract:  Bladder is within normal limits.

Bowel:  Visualized bowel is unremarkable.

Vascular/Lymphatic: No evidence of aneurysm.

No suspicious pelvic lymphadenopathy.

Reproductive: Uterus is notable for thickened endometrium, measuring
13 mm (series 3/image 26), in this patient with reported history of
endometrial cancer. No involvement of the cervix or upper vagina. No
serosal extension.

Bilateral ovaries are within normal limits.

Other:  No pelvic ascites.

Musculoskeletal: No focal osseous lesions.
IMPRESSION: Thickened endometrium, measuring 13 mm, in this patient with
reported history of endometrial cancer.

No involvement of the cervix or upper vagina. No serosal extension.
No lymphadenopathy.

ADDENDUM:
Addendum for further staging: No or minimal myometrial invasion,
less than 50%, suggesting stage 1a disease by imaging.

*** End of Addendum ***
FINDINGS: Urinary Tract:  Bladder is within normal limits.

Bowel:  Visualized bowel is unremarkable.

Vascular/Lymphatic: No evidence of aneurysm.

No suspicious pelvic lymphadenopathy.

Reproductive: Uterus is notable for thickened endometrium, measuring
13 mm (series 3/image 26), in this patient with reported history of
endometrial cancer. No involvement of the cervix or upper vagina. No
serosal extension.

Bilateral ovaries are within normal limits.

Other:  No pelvic ascites.

Musculoskeletal: No focal osseous lesions.
IMPRESSION: Thickened endometrium, measuring 13 mm, in this patient with
reported history of endometrial cancer.

No involvement of the cervix or upper vagina. No serosal extension.
No lymphadenopathy.

## 2020-09-10 MED ORDER — GADOBUTROL 1 MMOL/ML IV SOLN
10.0000 mL | Freq: Once | INTRAVENOUS | Status: AC | PRN
  Administered 2020-09-10: 10 mL via INTRAVENOUS

## 2020-09-11 ENCOUNTER — Telehealth: Payer: Self-pay

## 2020-09-11 NOTE — Telephone Encounter (Signed)
MRI results have been sent to Dr. Theora Gianotti for review.

## 2020-09-14 ENCOUNTER — Telehealth: Payer: Self-pay

## 2020-09-14 NOTE — Telephone Encounter (Signed)
Called and spoke with Kathryn Maddox regarding MRI results. Results reviewed. Message has been sent to radiology for addendum regarding any uterine myometrial invasion. We will update Kathryn Maddox once this information is received.  IMPRESSION: Thickened endometrium, measuring 13 mm, in this patient with reported history of endometrial cancer.  No involvement of the cervix or upper vagina. No serosal extension. No lymphadenopathy

## 2020-09-15 ENCOUNTER — Telehealth: Payer: Self-pay

## 2020-09-15 NOTE — Telephone Encounter (Signed)
Appointment arranged for 09/16/20 for treatment planning.

## 2020-09-16 ENCOUNTER — Encounter: Payer: Self-pay | Admitting: Obstetrics and Gynecology

## 2020-09-16 ENCOUNTER — Inpatient Hospital Stay (HOSPITAL_BASED_OUTPATIENT_CLINIC_OR_DEPARTMENT_OTHER): Payer: HMO | Admitting: Obstetrics and Gynecology

## 2020-09-16 VITALS — BP 151/74 | HR 77 | Temp 98.8°F | Wt 389.6 lb

## 2020-09-16 DIAGNOSIS — C541 Malignant neoplasm of endometrium: Secondary | ICD-10-CM

## 2020-09-16 NOTE — Progress Notes (Signed)
Gynecologic Oncology Interval Visit   Referring Provider: Dr. Vikki Ports Ward  PCP: Dr. Ellison Hughs  Chief Concern: grade 1 endometrial cancer  Subjective:  Kathryn Maddox is a 70 y.o. G58 female with multiple medical issues including morbid obesity, poorly controlled diabetes, and sleep apnea who is seen in consultation from Dr. Leonides Schanz for grade 1 endometrial cancer, who returns for discussion of management.   In the interim, MRI Pelvis W WO Contrast: Thickened endometrium, measuring 13 mm, in this patient with reported history of endometrial cancer. No involvement of the cervix or upper vagina. No serosal extension. No lymphadenopathy. Addendum for further staging: No or minimal myometrial invasion, less than 50%, suggesting stage 1a disease by imaging.  She has only light spotting. Also has some cramping and uses tylenol.  Cannot use NSAIDs because causes HTN.   Gynecologic Oncology History:  She has a long history of abnormal vaginal bleeding and menorrhagia and anemia due to chronic blood loss going back to her 30's. She had a syncopal episode from losing so much blood. In [place], had 9 D&Cs with hysteroscopy, last one was in 2010, and bleeding always continued. She was treated with progesterone injections to stop bleeding. In '92 she had precancerous cells in uterus and was seeing oncologist for this. Was supposed to get a hysterectomy with oncologist, but had a bowel obstruction and surgery was denied until she was recovered. Based on what she described she had a ventral hernia that was repaired and she does not know if bowel was resected or not. She has not been to gynecologist since 2011 until she was seen by Dr. Leonides Schanz 07/30/2020. She has had postmenopausal bleeding x 7-8 years. She does not have a h/o of vWD and she did not have abnormal bleeding with any of her surgical procedures.   Her work up has included pelvic US, EMBx, and labs.   Pelvic US 08/14/2020 at [facility]: Uterus: Measurements:  7.7 x 5.3 x 5.8 cm = volume: 123 mL. No fibroids or other mass visualized. Endometrium: Thickness: 10 mm.  No focal abnormality visualized. Right ovary: Nonvisualized. Left ovary: Nonvisualized. Other findings: No abnormal free fluid.  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 Leonides Schanz that was negative.   Problem List: Patient Active Problem List   Diagnosis Date Noted  . Endometrial cancer ([place]) 08/26/2020   Past Medical History: Past Medical History:  Diagnosis Date  . Carpal tunnel syndrome on both sides 03/2018  . Cellulitis and abscess of lower extremity 03/2018   bilateral lower extrems, treated with antibx x 10 days  . Diabetes mellitus without complication ([place])   . Dyspnea 03/2018   with any exercise, due to weight  . Eczema   . Endometrial cancer ([place]) 08/26/2020  . Hyperlipidemia   . Hypertension   . Morbid obesity with BMI of 70 and over, adult ([place]) 03/2018  . Sleep apnea 03/2018   uses a cpap   Past Surgical History: Past Surgical History:  Procedure Laterality Date  . CARPAL TUNNEL RELEASE Left 04/12/2018   Procedure: CARPAL TUNNEL RELEASE;  Surgeon: Hessie Knows, MD;  Location: [facility] ORS;  Service: Orthopedics;  Laterality: Left;  . COLONOSCOPY WITH PROPOFOL N/A 12/02/2019   Procedure: COLONOSCOPY WITH PROPOFOL;  Surgeon: Toledo, Benay Pike, MD;  Location: [facility] ENDOSCOPY;  Service: Gastroenterology;  Laterality: N/A;  . DILATION AND CURETTAGE OF UTERUS  03/2018   has had 10 d & c's with hysteroscopy  . HERNIA REPAIR  5465   umbilical w/ obstructed bowel  . LIPOMA EXCISION Right 1997   shoulder  . obstructive bowel surgery  01/2010   Past Gynecologic History:  Menarche: 12 Menstrual details: 7 Menses regular:No  Long h/o abnormal vaginal bleeding Last Menstrual Period: unknown HHistory of Abnormal pap:No Last pap: Unknown - need to verify  obtained by Dr. Leonides Schanz  OB History: GOP0 OB History  No obstetric history on file.   Family History: Family History  Problem Relation Age of Onset  . Breast cancer Neg Hx    Social History: Social History   Socioeconomic History  . Marital status: Divorced    Spouse name: Not on file  . Number of children: Not on file  . Years of education: Not on file  . Highest education level: Not on file  Occupational History  . 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 Smoking status: Never Smoker  . Smokeless tobacco: Never Used  Vaping Use  . Vaping Use: Never used  Substance and Sexual Activity  . Alcohol use: Yes    Comment: rarely  . Drug use: Never  . Sexual activity: Not Currently  Other Topics Concern  . Not on file  Social History Narrative  . Not on file   Social Determinants of Health   Financial Resource Strain: Not on file  Food Insecurity: Not on file  Transportation Needs: Not on file  Physical Activity: Not on file  Stress: Not on file  Social Connections: Not on file  Intimate Partner Violence: Not on file   Allergies: Allergies  Allergen Reactions  . Megace [Megestrol] Rash  . Penicillins Rash and Other (See Comments)    Has patient had a PCN reaction causing immediate rash, facial/tongue/throat swelling, SOB or lightheadedness with hypotension: Unknown Has patient had a PCN reaction causing severe rash involving mucus membranes or skin necrosis: Unknown Has patient had a PCN reaction that required hospitalization: Unknown Has patient had a PCN reaction occurring within the last 10 years: No If all of the above answers are "NO", then may proceed with Cephalosporin use.   . Pravastatin Other (See Comments)    Muscle pain, extreme constipation   Current Medications: Current Outpatient Medications  Medication Sig Dispense Refill  . acetaminophen (TYLENOL) 650 MG CR tablet Take 1,300 mg by  mouth every 8 (eight) hours as needed for pain.    . Alpha-Lipoic Acid 200 MG TABS Take 200 mg by mouth 3 (three) times daily. (Patient not taking: No sig reported)    . HYDROcodone-acetaminophen (NORCO) 5-325 MG tablet Take 1 tablet by mouth every 4 (four) hours as needed for moderate pain. (Patient not taking: No sig reported) 20 tablet 0  . losartan (COZAAR) 50 MG tablet Take 50 mg by mouth daily.    . rosuvastatin (CRESTOR) 5 MG tablet Take 5 mg by mouth daily.    . TRULICITY 0.35 WS/5.6CL SOPN      No current facility-administered medications for this visit.   Review of Systems General:  no complaints Skin: no complaints Eyes: no complaints HEENT: no complaints Breasts: no complaints Pulmonary: no complaints Cardiac: no complaints Gastrointestinal: no complaints Genitourinary/Sexual: no complaints Ob/Gyn: no complaints Musculoskeletal: no complaints Hematology: no complaints Neurologic/Psych: no complaints  Objective:  Physical Examination:  BP (!) 151/74   Pulse 77   Temp  98.8 F (37.1 C)   Wt (!) 389 lb 9.6 oz (176.7 kg)   BMI 73.61 kg/m   ECOG Performance Status: 1 - Symptomatic but completely ambulatory  GENERAL: Morbidly obese female. Transported via wheelchair ABDOMEN:  Large panniculus. Exam limited due to habitus. Soft, nontender.  MSK:  No focal spinal tenderness to palpation. Full range of motion bilaterally in the upper extremities. EXTREMITIES:  2+ bilateral edema  SKIN:  Clear with no obvious rashes or skin changes.  NEURO:  Nonfocal. Well oriented.  Appropriate affect.  Pelvic Exam deferred  Lab Review No labs on site  Radiologic Imaging: Above    Assessment:  Lyrik Dockstader is a 70 y.o. female diagnosed with grade 1 endometrial cancer 2/22 on endometrial biopsy. Morbid obesity with BMI 72.  MRI with 13 mm stripe. No or minimal myometrial invasion, less than 50%, suggesting stage 1a disease by imaging.  Medical co-morbidities complicating care:  HTN and diabetes non-insulin dependent, morbid obesity (BMI 72) and sleep apnea Plan:   Problem List Items Addressed This Visit      Genitourinary   Endometrial cancer ([place]) - Primary     We discussed options for management including surgery, radiation, and hormonal therapy. She has significant medical issues including morbid obesity, poorly controlled diabetes, and sleep apnea. If surgery was done this would likely be open with attendant risks due to to BMI 72.   A long discussion was held about management options for invasive uterine adenocarcinoma in a morbidly obese patient. There are benefits and risks to each approach. She may not be a candidate for surgery and her glycemic control needs to be optimized.  Hormonal management with progestins or an aromatase inhibitor can stop the bleeding and control the cancer in up to two-thirds of cases.  Repeat endometrial sampling over time would be required to assess the response to hormonal therapy.  The patient understands that there is a significant risk of about 1 in 3 of persistent cancer despite hormonal treatment as well as a small risk of developing metastatic disease despite hormonal treatment.  These risks must be weighed against the risks of anesthesia and surgery in this patient with morbid obesity and other medical co-morbidities.      Plan for her to see Dr Leonides Schanz for Mirena IUD placement and then she can do a repeat biopsy in 3 months to assess response.  Even if persistent cancer at that point would not do anything different.  We will see her for another biopsy in 6 months. If persistent cancer at 6 months could consider therapeutic D&C.  Would prefer not to add oral progestins in view of her morbid obesity.   Optimize glycemic control and other medical issues with her PCP, Dr. Ellison Hughs, and her endocrinologist, Dr. Honor Junes.   Verlon Au, NP  I personally interviewed and examined the patient. Agreed with the above/below plan of  care. I have directly contributed to assessment and plan of care of this patient and educated and discussed with patient and family.  Mellody Drown, MD

## 2020-09-21 ENCOUNTER — Telehealth: Payer: Self-pay

## 2020-09-21 NOTE — Telephone Encounter (Signed)
Dr. Guido Sander office is aware of need for IUD placement. They are currently working on Biochemist, clinical.

## 2020-09-22 ENCOUNTER — Other Ambulatory Visit: Payer: Self-pay

## 2020-09-25 DIAGNOSIS — J069 Acute upper respiratory infection, unspecified: Secondary | ICD-10-CM | POA: Diagnosis not present

## 2020-09-30 ENCOUNTER — Other Ambulatory Visit: Payer: HMO

## 2020-09-30 DIAGNOSIS — C541 Malignant neoplasm of endometrium: Secondary | ICD-10-CM

## 2020-09-30 NOTE — Progress Notes (Signed)
Tumor Board Documentation  Phylicia Mcgaugh was presented by  at our Tumor Board on 09/30/2020, which included representatives from medical oncology,radiation oncology,surgical oncology,navigation,pathology,palliative care.  Araceli currently presents as a current patient,for discussion,for new tumor(s),for MDC with history of the following treatments: none.  Additionally, we reviewed previous medical and familial history, history of present illness, and recent lab results along with all available histopathologic and imaging studies. The tumor board considered available treatment options and made the following recommendations:   Recommended hormonal therapy with IUD though treatment options are limited given  her medical comorbidities. Dr. Theora Gianotti adds that [facility] trial data supports 43% response with IUD.  The following procedures/referrals were also placed: No orders of the defined types were placed in this encounter.   Clinical Trial Status: not discussed   Staging used:    National site-specific guidelines NCCN were discussed with respect to the case.  Tumor board is a meeting of clinicians from various specialty areas who evaluate and discuss patients for whom a multidisciplinary approach is being considered. Final determinations in the plan of care are those of the provider(s). The responsibility for follow up of recommendations given during tumor board is that of the provider.   Today's extended care, comprehensive team conference, Claira was not present for the discussion and was not examined.

## 2020-10-06 DIAGNOSIS — M159 Polyosteoarthritis, unspecified: Secondary | ICD-10-CM | POA: Diagnosis not present

## 2020-10-06 DIAGNOSIS — R2689 Other abnormalities of gait and mobility: Secondary | ICD-10-CM | POA: Diagnosis not present

## 2020-10-16 DIAGNOSIS — I152 Hypertension secondary to endocrine disorders: Secondary | ICD-10-CM | POA: Diagnosis not present

## 2020-10-16 DIAGNOSIS — E1159 Type 2 diabetes mellitus with other circulatory complications: Secondary | ICD-10-CM | POA: Diagnosis not present

## 2020-10-16 DIAGNOSIS — R809 Proteinuria, unspecified: Secondary | ICD-10-CM | POA: Diagnosis not present

## 2020-10-16 DIAGNOSIS — E1129 Type 2 diabetes mellitus with other diabetic kidney complication: Secondary | ICD-10-CM | POA: Diagnosis not present

## 2020-10-16 DIAGNOSIS — E785 Hyperlipidemia, unspecified: Secondary | ICD-10-CM | POA: Diagnosis not present

## 2020-10-16 DIAGNOSIS — E1169 Type 2 diabetes mellitus with other specified complication: Secondary | ICD-10-CM | POA: Diagnosis not present

## 2020-11-05 DIAGNOSIS — M159 Polyosteoarthritis, unspecified: Secondary | ICD-10-CM | POA: Diagnosis not present

## 2020-11-05 DIAGNOSIS — R2689 Other abnormalities of gait and mobility: Secondary | ICD-10-CM | POA: Diagnosis not present

## 2020-11-11 ENCOUNTER — Telehealth: Payer: Self-pay | Admitting: *Deleted

## 2020-11-11 NOTE — Telephone Encounter (Signed)
Patient would like advice regarding IUD placement.

## 2020-11-12 NOTE — Telephone Encounter (Signed)
Called and spoke with Kathryn Maddox. At this time the IUD has not been placed. [facility] wants to do it in the OR and her insurance does not cover. They are working on getting this covered for her. I have sent Dr. Theora Gianotti and Dr. Fransisca Connors a message regarding this.

## 2020-11-17 ENCOUNTER — Telehealth: Payer: Self-pay

## 2020-11-17 NOTE — Telephone Encounter (Signed)
Called and spoke with Ms. Kathryn Maddox. She has been rescheduled to see Dr. Leafy Ro for IUD placement 12/02/20. Will continue to follow.

## 2020-12-02 DIAGNOSIS — Z30430 Encounter for insertion of intrauterine contraceptive device: Secondary | ICD-10-CM | POA: Diagnosis not present

## 2020-12-02 DIAGNOSIS — C541 Malignant neoplasm of endometrium: Secondary | ICD-10-CM | POA: Diagnosis not present

## 2020-12-09 ENCOUNTER — Other Ambulatory Visit: Payer: Self-pay

## 2020-12-09 ENCOUNTER — Ambulatory Visit
Admission: RE | Admit: 2020-12-09 | Discharge: 2020-12-09 | Disposition: A | Discharge status: Home / Self Care | Payer: HMO | Source: Ambulatory Visit | Attending: Obstetrics and Gynecology | Admitting: Obstetrics and Gynecology

## 2020-12-09 ENCOUNTER — Other Ambulatory Visit: Payer: Self-pay | Admitting: Obstetrics and Gynecology

## 2020-12-09 DIAGNOSIS — Z975 Presence of (intrauterine) contraceptive device: Secondary | ICD-10-CM

## 2020-12-09 DIAGNOSIS — N8500 Endometrial hyperplasia, unspecified: Secondary | ICD-10-CM | POA: Diagnosis not present

## 2020-12-09 DIAGNOSIS — C541 Malignant neoplasm of endometrium: Secondary | ICD-10-CM | POA: Diagnosis not present

## 2020-12-09 DIAGNOSIS — R102 Pelvic and perineal pain: Secondary | ICD-10-CM

## 2020-12-09 DIAGNOSIS — R9389 Abnormal findings on diagnostic imaging of other specified body structures: Secondary | ICD-10-CM | POA: Diagnosis not present

## 2020-12-09 IMAGING — US US PELVIS COMPLETE WITH TRANSVAGINAL
1 series · 13 of 25 positions shown · non-contrast
Comparison: MRI [DATE], ultrasound [DATE]

CLINICAL DATA: Pelvic pain after IUD placement,



[Series 1: us pelvic complete with transvaginal · 63 acquisitions, 13 frames shown]
[im 1/63]
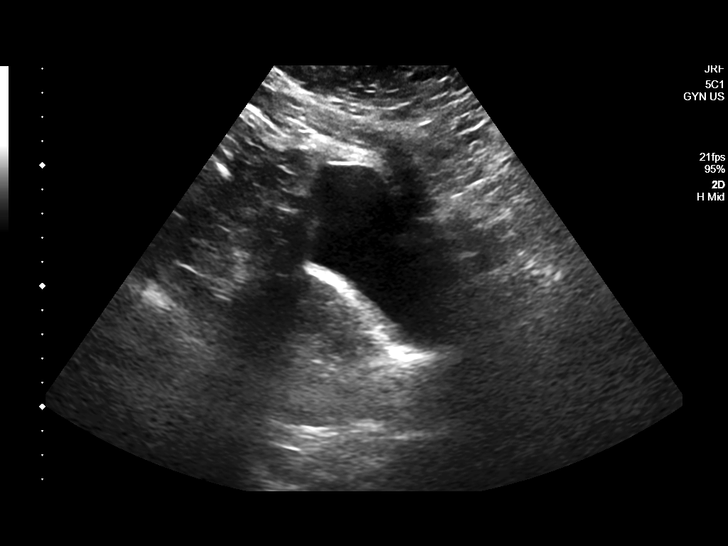
[im 6/63]
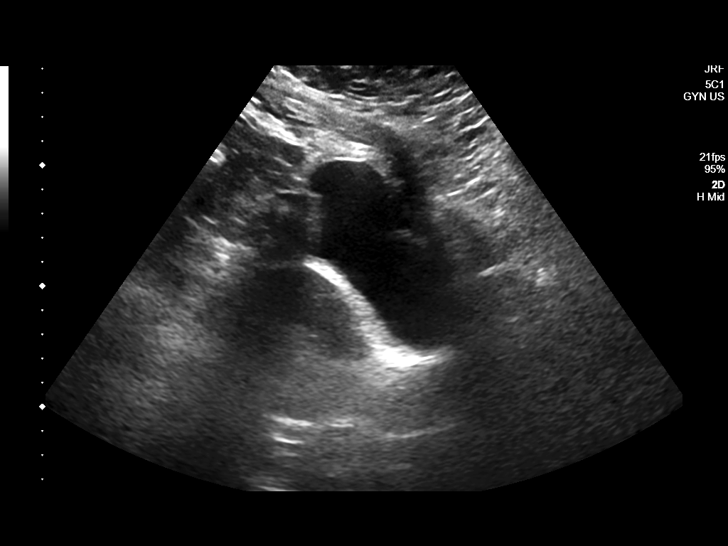
[im 11/63]
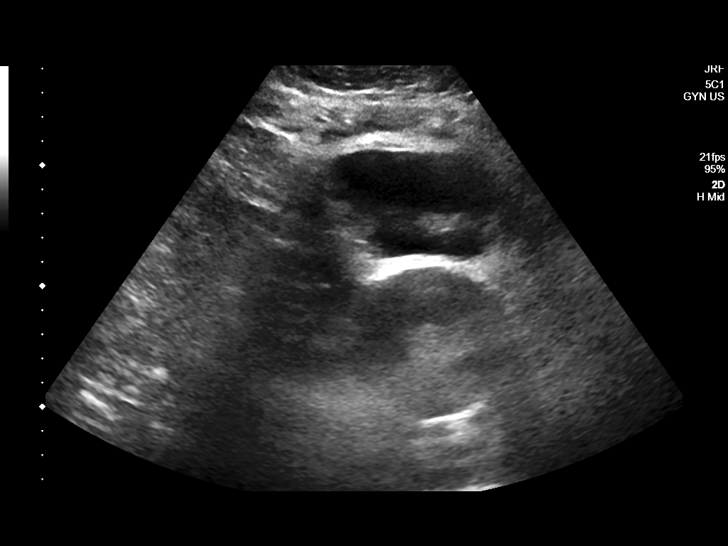
[im 16/63]
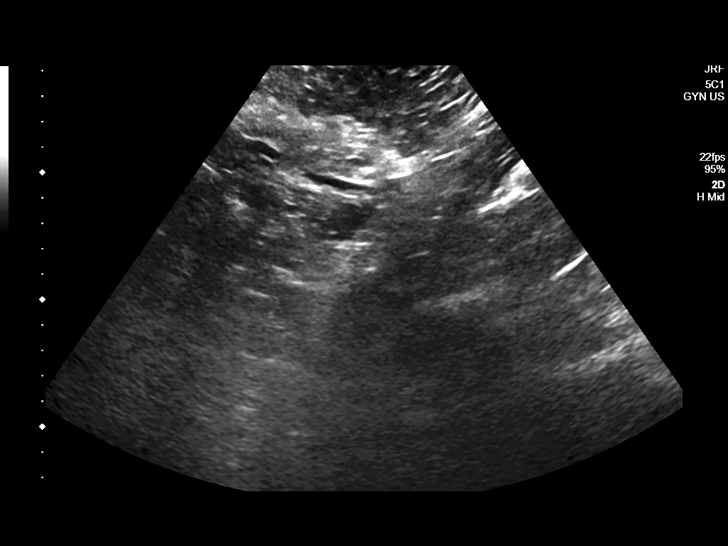
[im 21/63]
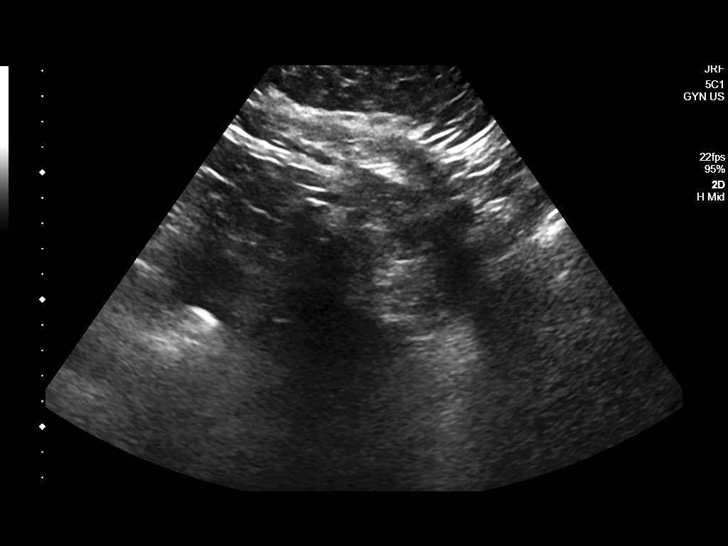
[im 26/63]
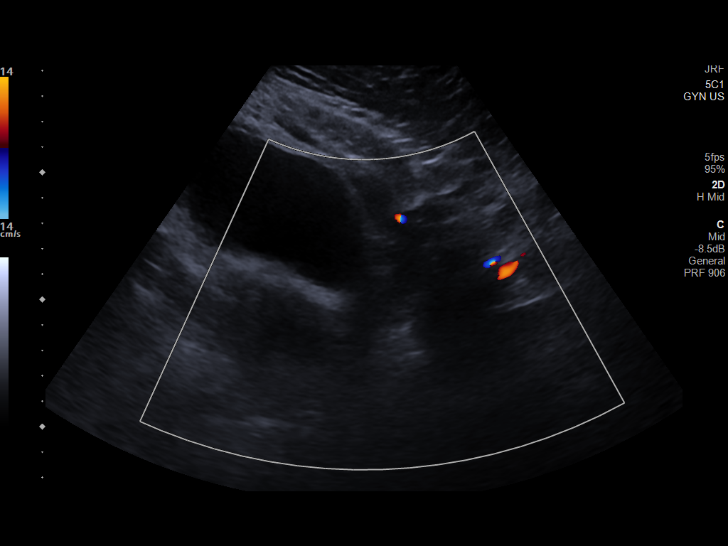
[im 32/63]
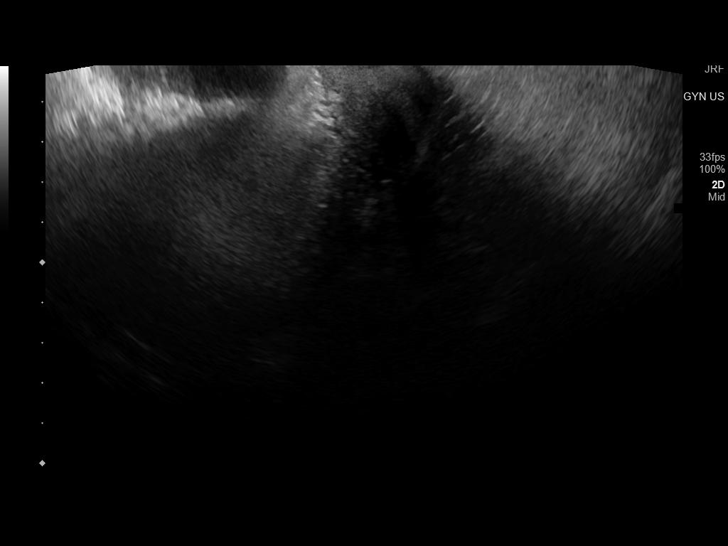
[im 37/63]
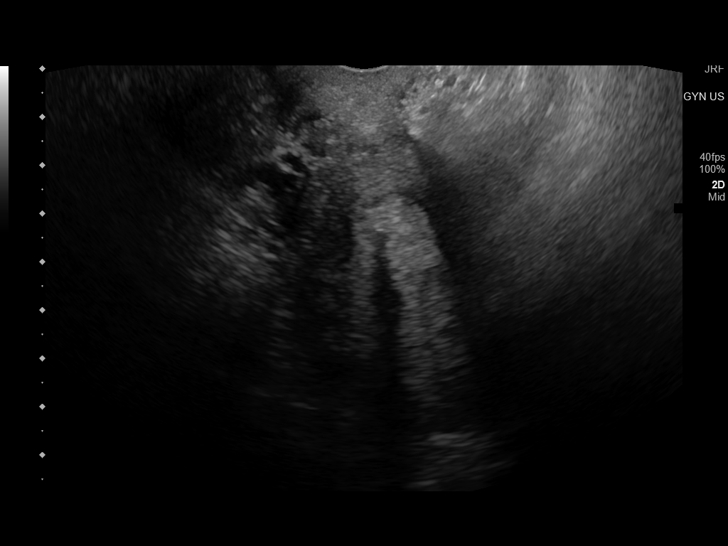
[im 42/63]
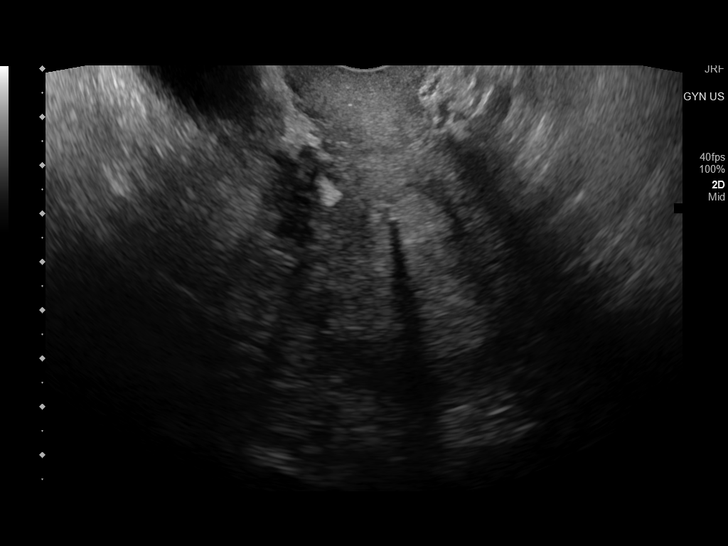
[im 47/63]
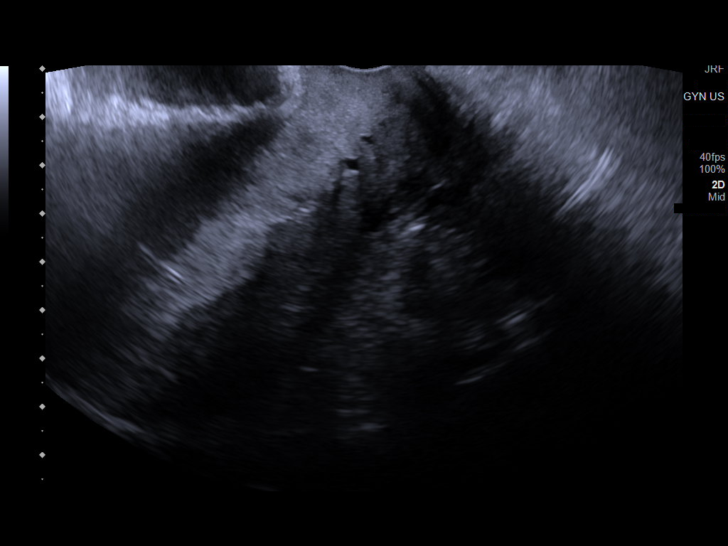
[im 52/63]
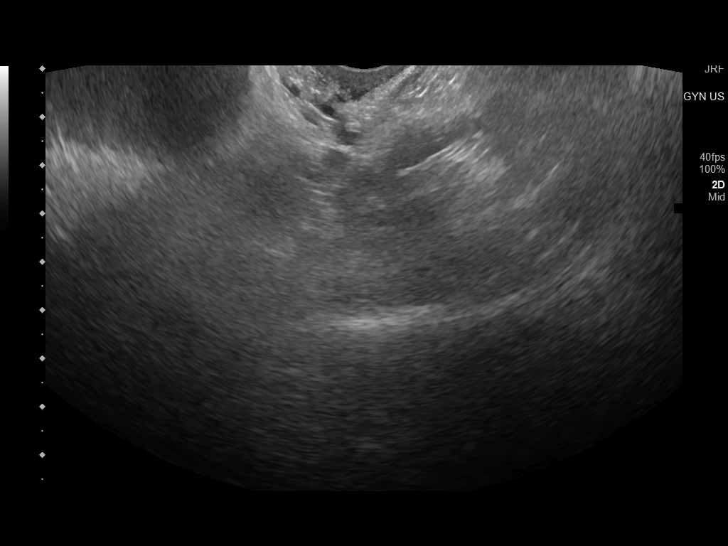
[im 57/63]
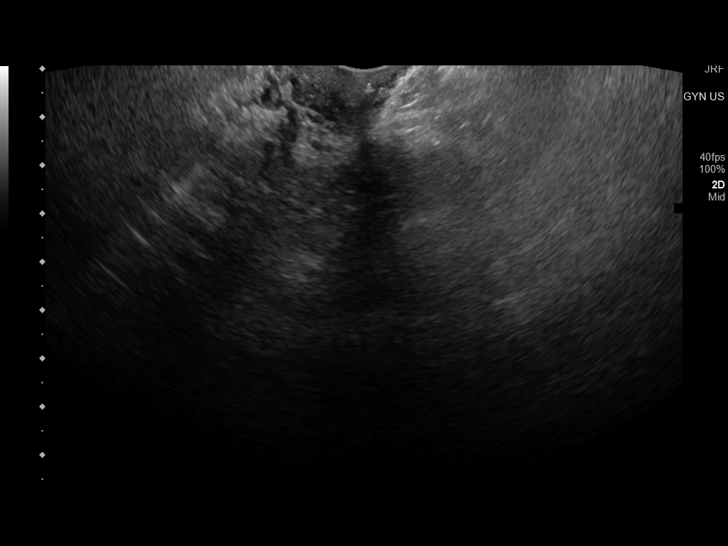
[im 63/63]
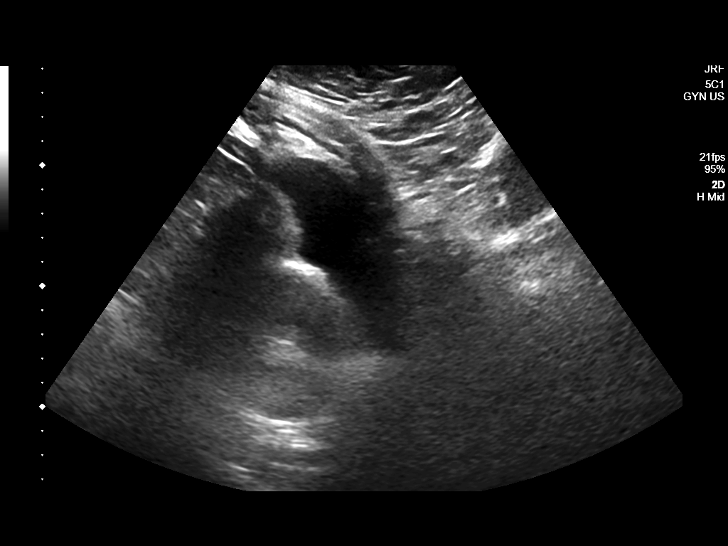

[13 of 25 positions shown; findings below may reference images not displayed]

FINDINGS: Uterus

Measurements: 10.2 x 6.2 x 5.3 cm = volume: 174 mL.

Endometrium

Thickness: 19.7 mm. There is a linear echogenic focus with
associated posterior shadowing in the endometrial cavity favored to
represent the IUD that was recently placed. This is poorly
visualized.

Right ovary

Measurements: Not visualized, patient is postmenopausal.

Left ovary

Measurements: Not visualized, patient is postmenopausal.

Other findings

No abnormal free fluid.
IMPRESSION: Thickened endometrium measuring 19.7 mm consistent with reported
history of endometrial cancer. Intrauterine device appears to be in
place within the endometrial cavity, but is poorly visualized.

## 2020-12-10 ENCOUNTER — Ambulatory Visit: Admission: RE | Admit: 2020-12-10 | Payer: HMO | Source: Ambulatory Visit

## 2020-12-23 ENCOUNTER — Other Ambulatory Visit: Payer: Self-pay | Admitting: Family Medicine

## 2020-12-23 DIAGNOSIS — Z1231 Encounter for screening mammogram for malignant neoplasm of breast: Secondary | ICD-10-CM

## 2021-01-06 DIAGNOSIS — E782 Mixed hyperlipidemia: Secondary | ICD-10-CM | POA: Diagnosis not present

## 2021-01-14 DIAGNOSIS — E1165 Type 2 diabetes mellitus with hyperglycemia: Secondary | ICD-10-CM | POA: Diagnosis not present

## 2021-01-14 DIAGNOSIS — E782 Mixed hyperlipidemia: Secondary | ICD-10-CM | POA: Diagnosis not present

## 2021-01-14 DIAGNOSIS — Z0000 Encounter for general adult medical examination without abnormal findings: Secondary | ICD-10-CM | POA: Diagnosis not present

## 2021-01-14 DIAGNOSIS — Z6841 Body Mass Index (BMI) 40.0 and over, adult: Secondary | ICD-10-CM | POA: Diagnosis not present

## 2021-01-14 DIAGNOSIS — I10 Essential (primary) hypertension: Secondary | ICD-10-CM | POA: Diagnosis not present

## 2021-01-14 DIAGNOSIS — C541 Malignant neoplasm of endometrium: Secondary | ICD-10-CM | POA: Diagnosis not present

## 2021-01-14 DIAGNOSIS — G4733 Obstructive sleep apnea (adult) (pediatric): Secondary | ICD-10-CM | POA: Diagnosis not present

## 2021-01-18 ENCOUNTER — Other Ambulatory Visit: Payer: Self-pay | Admitting: Obstetrics and Gynecology

## 2021-01-18 DIAGNOSIS — Z975 Presence of (intrauterine) contraceptive device: Secondary | ICD-10-CM

## 2021-01-18 DIAGNOSIS — C541 Malignant neoplasm of endometrium: Secondary | ICD-10-CM

## 2021-01-19 ENCOUNTER — Ambulatory Visit
Admission: RE | Admit: 2021-01-19 | Discharge: 2021-01-19 | Disposition: A | Discharge status: Home / Self Care | Payer: HMO | Source: Ambulatory Visit | Attending: Obstetrics and Gynecology | Admitting: Obstetrics and Gynecology

## 2021-01-19 ENCOUNTER — Other Ambulatory Visit: Payer: Self-pay

## 2021-01-19 DIAGNOSIS — N8500 Endometrial hyperplasia, unspecified: Secondary | ICD-10-CM | POA: Diagnosis not present

## 2021-01-19 DIAGNOSIS — C541 Malignant neoplasm of endometrium: Secondary | ICD-10-CM | POA: Insufficient documentation

## 2021-01-19 DIAGNOSIS — Z8542 Personal history of malignant neoplasm of other parts of uterus: Secondary | ICD-10-CM | POA: Diagnosis not present

## 2021-01-19 DIAGNOSIS — R9389 Abnormal findings on diagnostic imaging of other specified body structures: Secondary | ICD-10-CM | POA: Diagnosis not present

## 2021-01-19 DIAGNOSIS — Z975 Presence of (intrauterine) contraceptive device: Secondary | ICD-10-CM | POA: Diagnosis not present

## 2021-01-19 IMAGING — US US PELVIS COMPLETE WITH TRANSVAGINAL
1 series · 15 of 25 positions shown · non-contrast
Comparison: Ultrasound [DATE].

CLINICAL DATA: History endometrial cancer.

EXAM:
TRANSABDOMINAL AND TRANSVAGINAL ULTRASOUND OF PELVIS
TECHNIQUE: Both transabdominal and transvaginal ultrasound examinations of the
pelvis were performed. Transabdominal technique was performed for
global imaging of the pelvis including uterus, ovaries, adnexal
regions, and pelvic cul-de-sac. It was necessary to proceed with
endovaginal exam following the transabdominal exam to visualize the
uterus and ovary

[Series 1: us pelvis complete · 41 acquisitions, 15 frames shown]
[im 1/41]
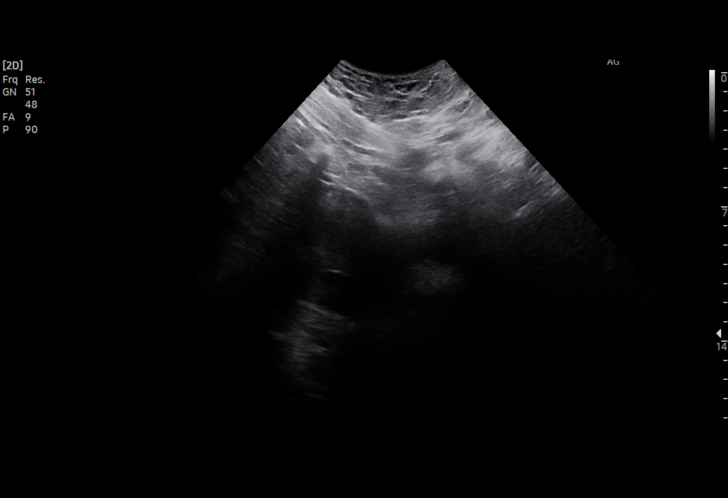
[im 4/41]
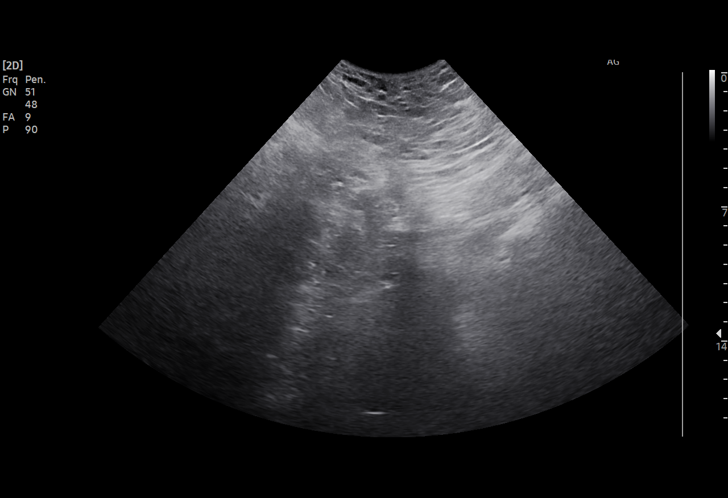
[im 7/41]
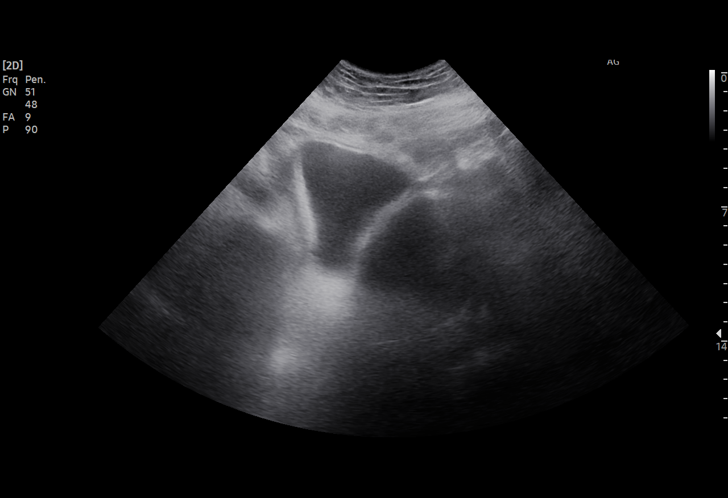
[im 9/41]
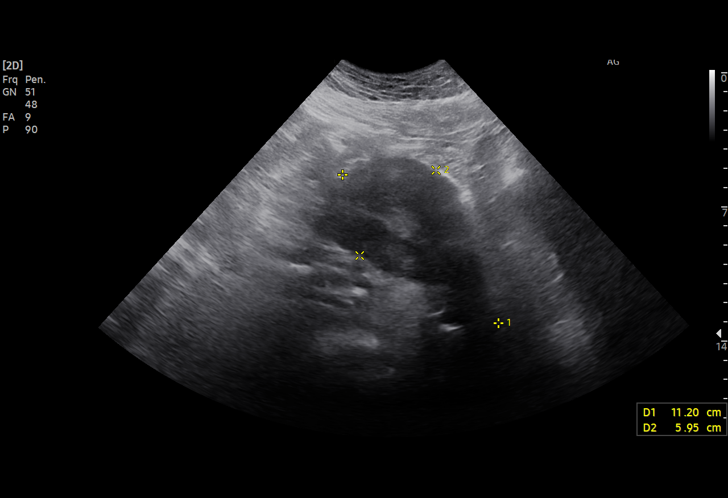
[im 12/41]
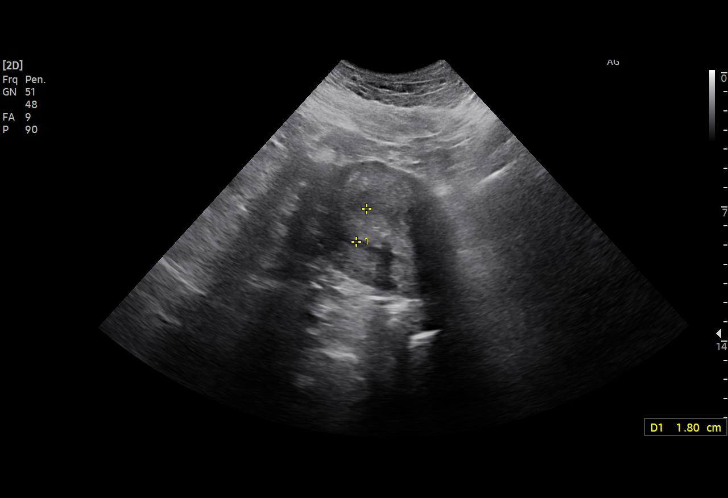
[im 16/41]
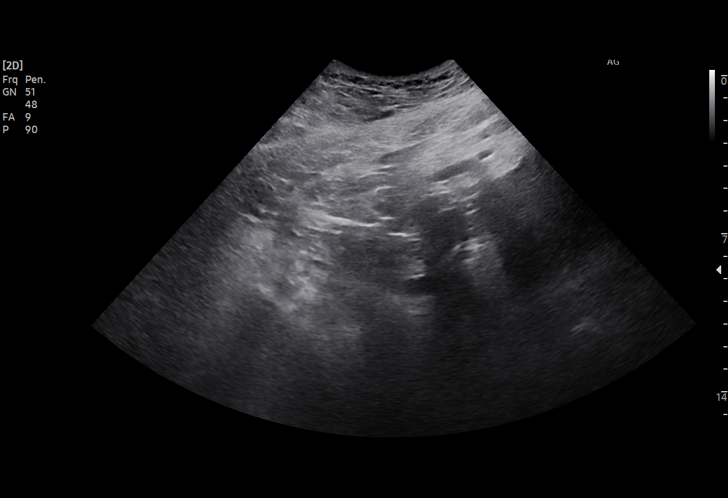
[im 17/41]
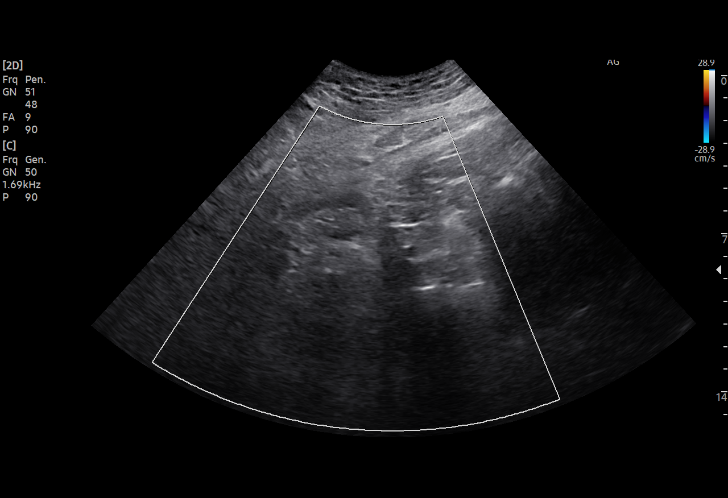
[im 21/41]
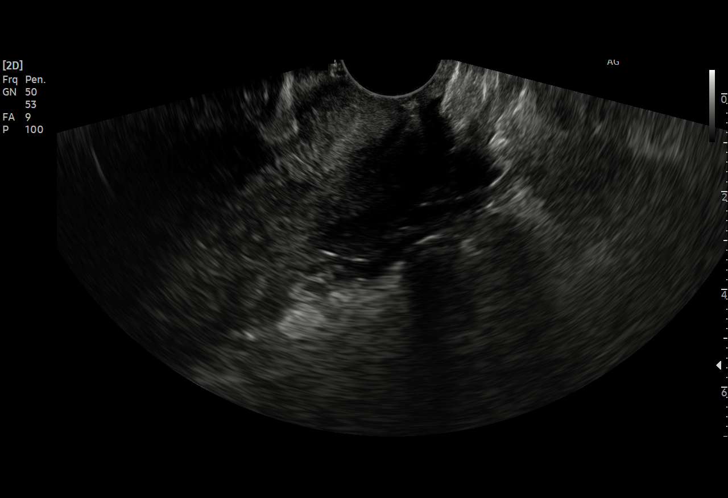
[im 24/41]
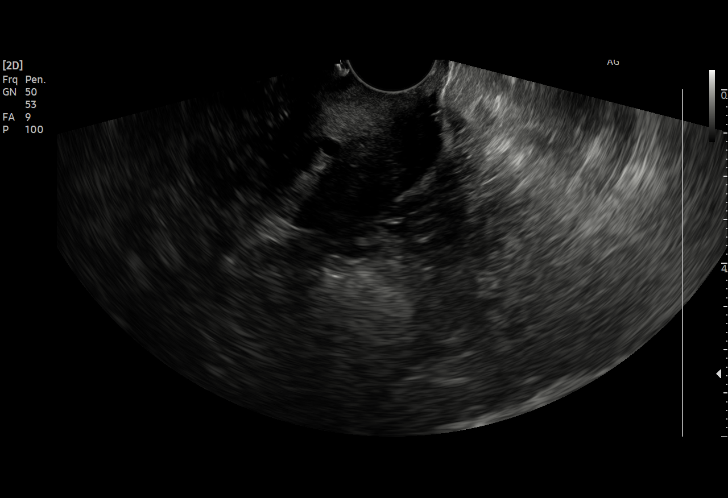
[im 26/41]
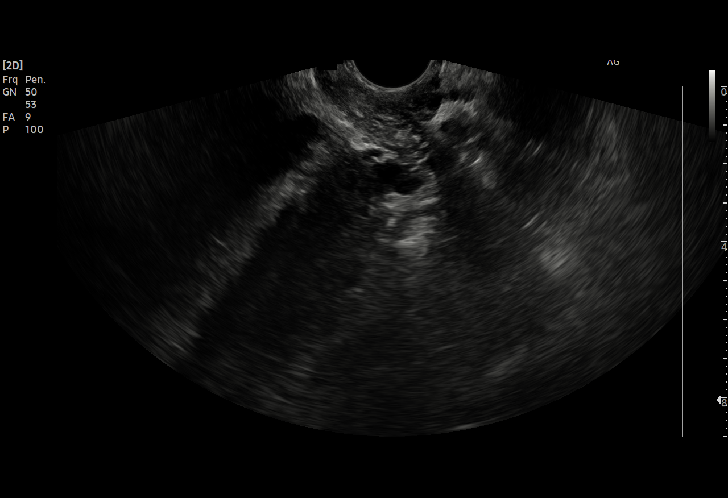
[im 29/41]
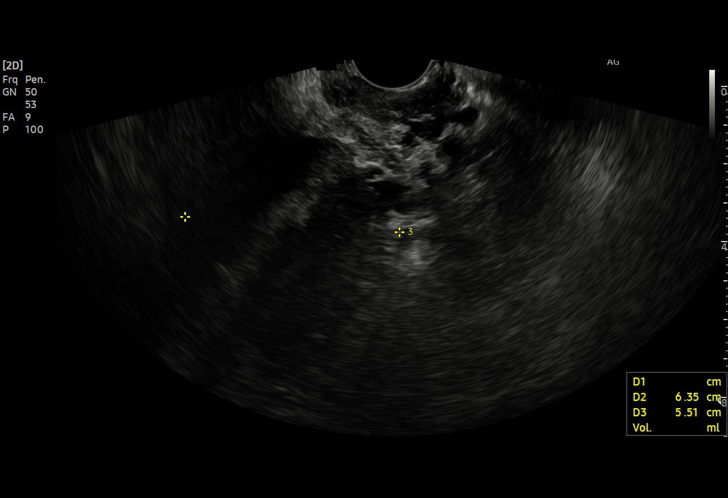
[im 32/41]
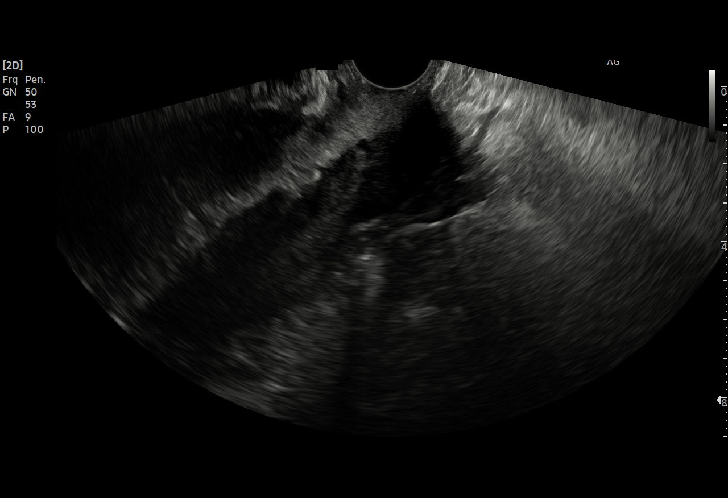
[im 34/41]
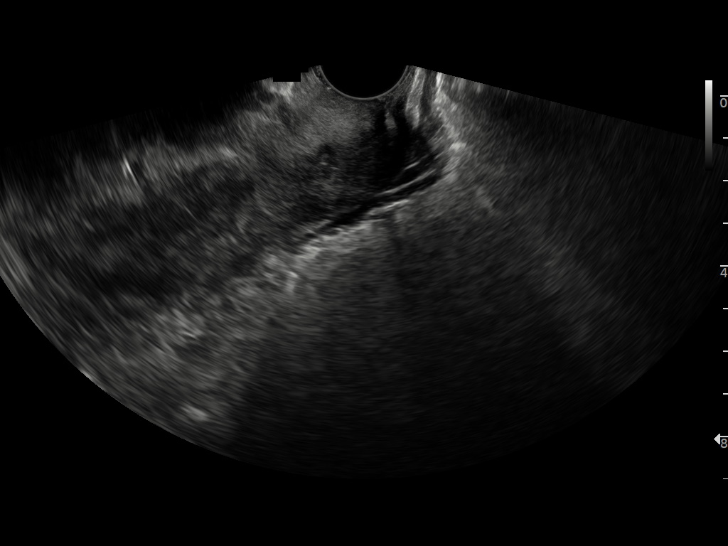
[im 37/41]
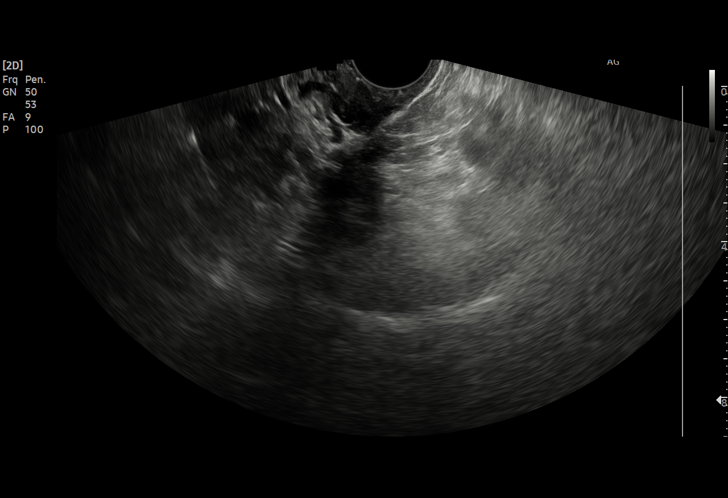
[im 41/41]
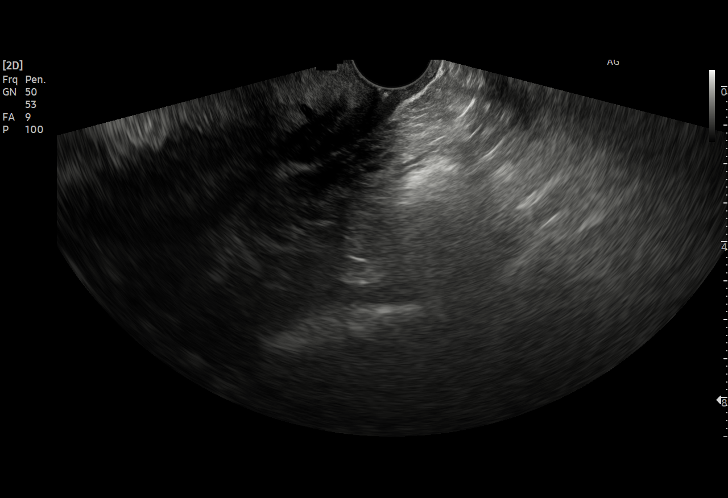

[15 of 25 positions shown; findings below may reference images not displayed]

FINDINGS: Uterus

Measurements: 10.2 x 6.4 x 5.5 cm = volume: 186.4 mL. No fibroids or
other mass visualized.

Endometrium

Thickness: 15 mm.  No focal abnormality visualized.  IUD noted.

Right ovary

Measurements: Not visualized

Left ovary

Measurements: Not visualized

Other findings

No abnormal free fluid.
IMPRESSION: 1. Endometrial thickening is again noted in this patient with a
history of endometrial cancer. Endometrial thickening to 15 mm
present.

2.  IUD again appears to be present.

## 2021-01-29 DIAGNOSIS — R809 Proteinuria, unspecified: Secondary | ICD-10-CM | POA: Diagnosis not present

## 2021-01-29 DIAGNOSIS — N950 Postmenopausal bleeding: Secondary | ICD-10-CM | POA: Diagnosis not present

## 2021-01-29 DIAGNOSIS — E1129 Type 2 diabetes mellitus with other diabetic kidney complication: Secondary | ICD-10-CM | POA: Diagnosis not present

## 2021-01-29 DIAGNOSIS — M461 Sacroiliitis, not elsewhere classified: Secondary | ICD-10-CM | POA: Diagnosis not present

## 2021-01-29 DIAGNOSIS — M160 Bilateral primary osteoarthritis of hip: Secondary | ICD-10-CM | POA: Diagnosis not present

## 2021-02-08 ENCOUNTER — Ambulatory Visit
Admission: RE | Admit: 2021-02-08 | Discharge: 2021-02-08 | Disposition: A | Discharge status: Home / Self Care | Payer: HMO | Source: Ambulatory Visit | Attending: Family Medicine | Admitting: Family Medicine

## 2021-02-08 ENCOUNTER — Other Ambulatory Visit: Payer: Self-pay

## 2021-02-08 DIAGNOSIS — Z1231 Encounter for screening mammogram for malignant neoplasm of breast: Secondary | ICD-10-CM | POA: Diagnosis not present

## 2021-02-08 IMAGING — MG MM DIGITAL SCREENING BILAT W/ TOMO AND CAD
8 of 17 series · 8 of 40 positions shown · non-contrast
Comparison: Previous exam(s).

ACR Breast Density Category a: The breast tissue is almost entirely
fatty.

CLINICAL DATA: Screening.

EXAM:
DIGITAL SCREENING BILATERAL MAMMOGRAM WITH TOMOSYNTHESIS AND CAD
TECHNIQUE: Bilateral screening digital craniocaudal and mediolateral oblique
mammograms were obtained. Bilateral screening digital breast
tomosynthesis was performed. The images were evaluated with
computer-aided detection.

[R MLO synth-2D (1 of 2)]
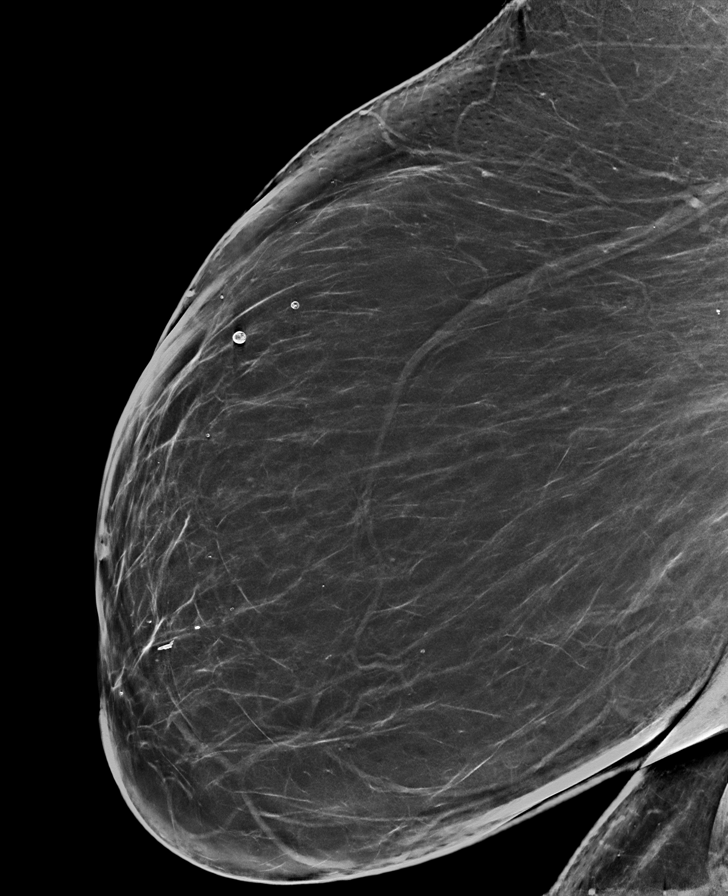

[R CC synth-2D]
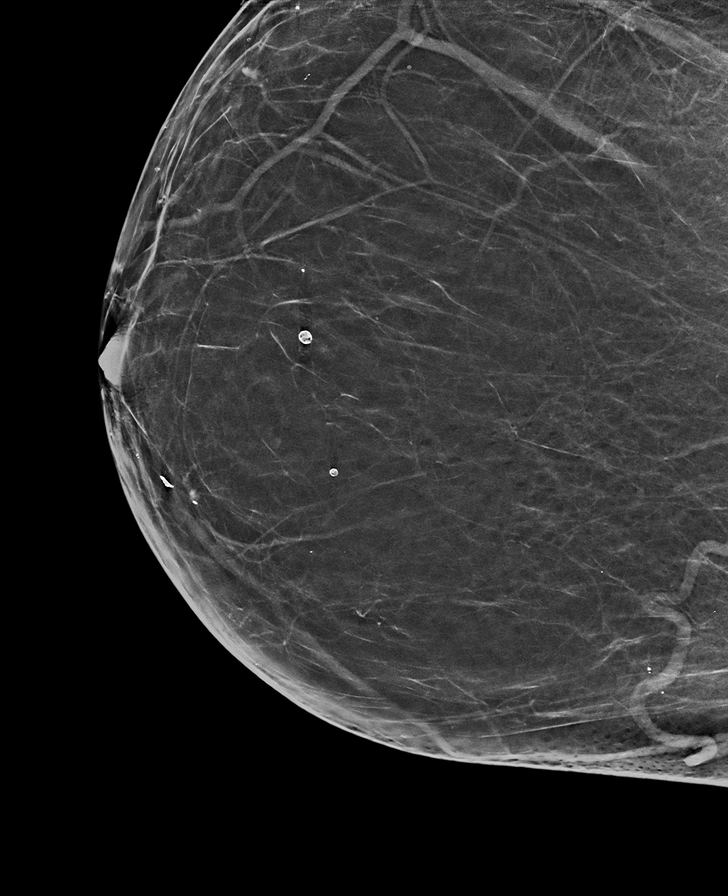

[R MLO synth-2D (2 of 2)]
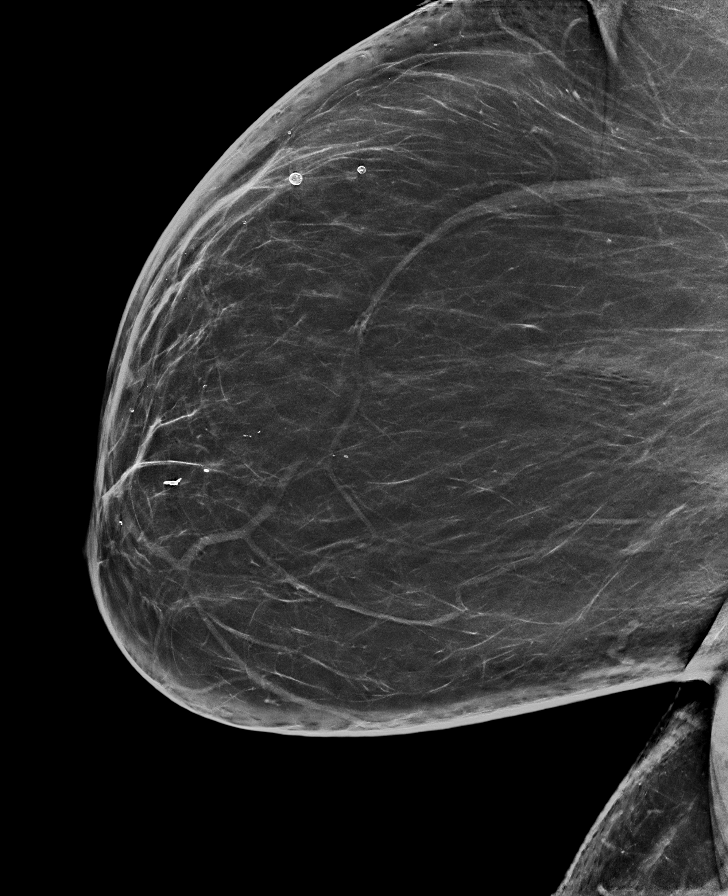

[L CC synth-2D (1 of 3)]
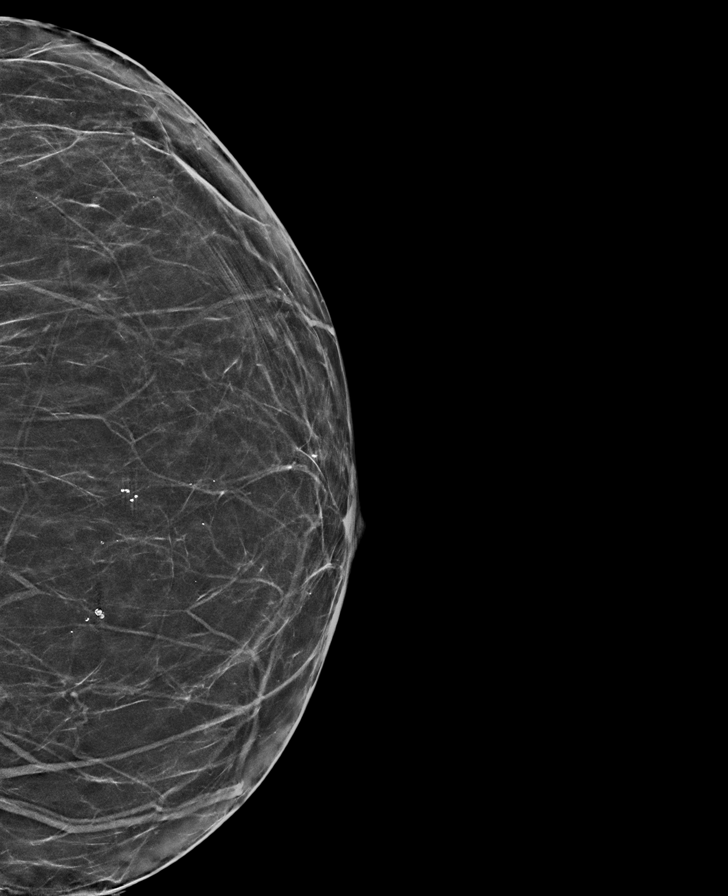

[L CC synth-2D (2 of 3)]
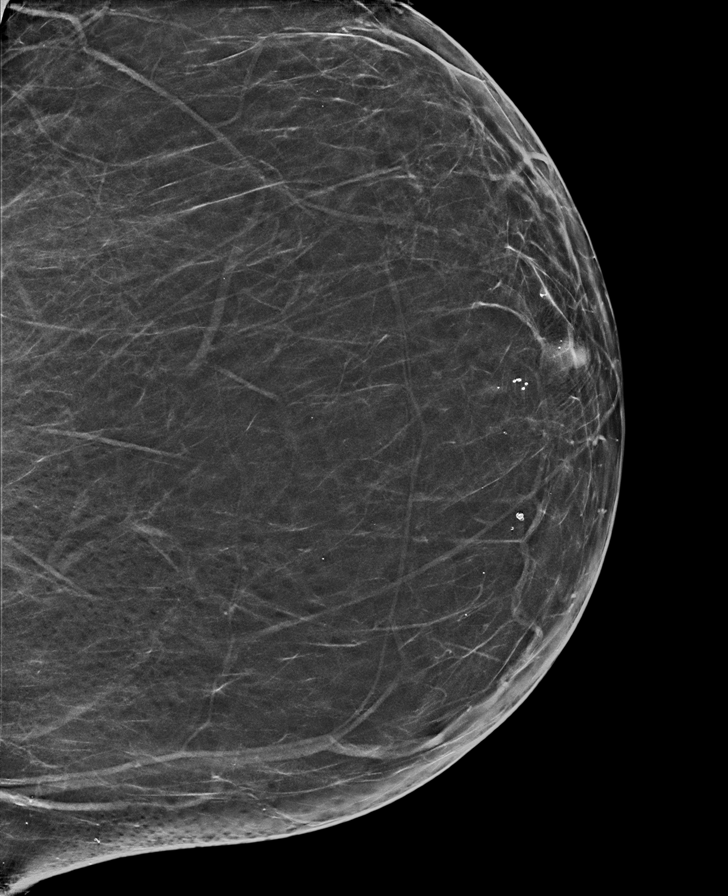

[L MLO synth-2D]
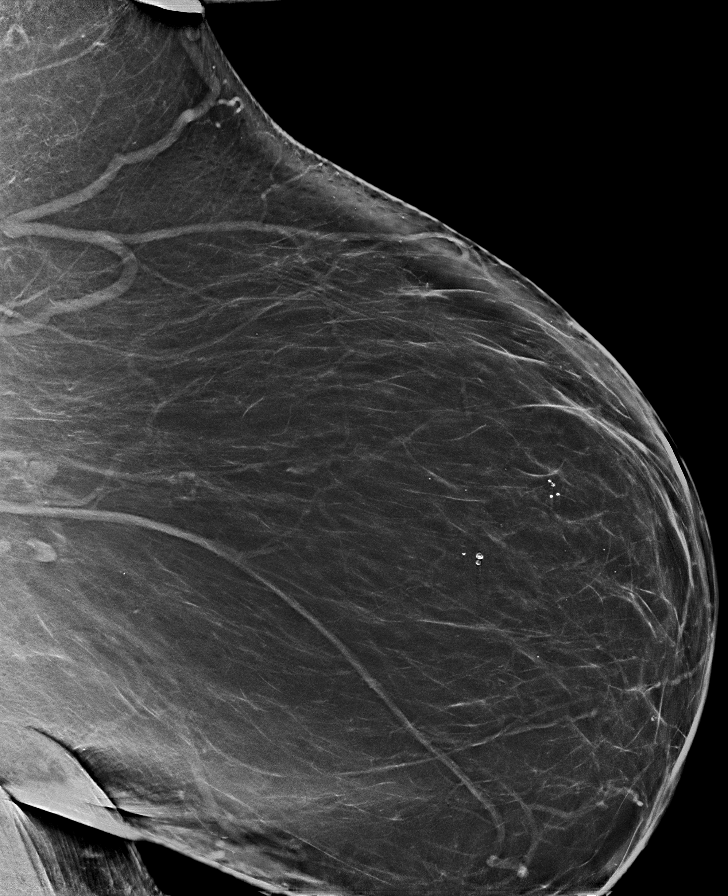

[L CC synth-2D (3 of 3)]
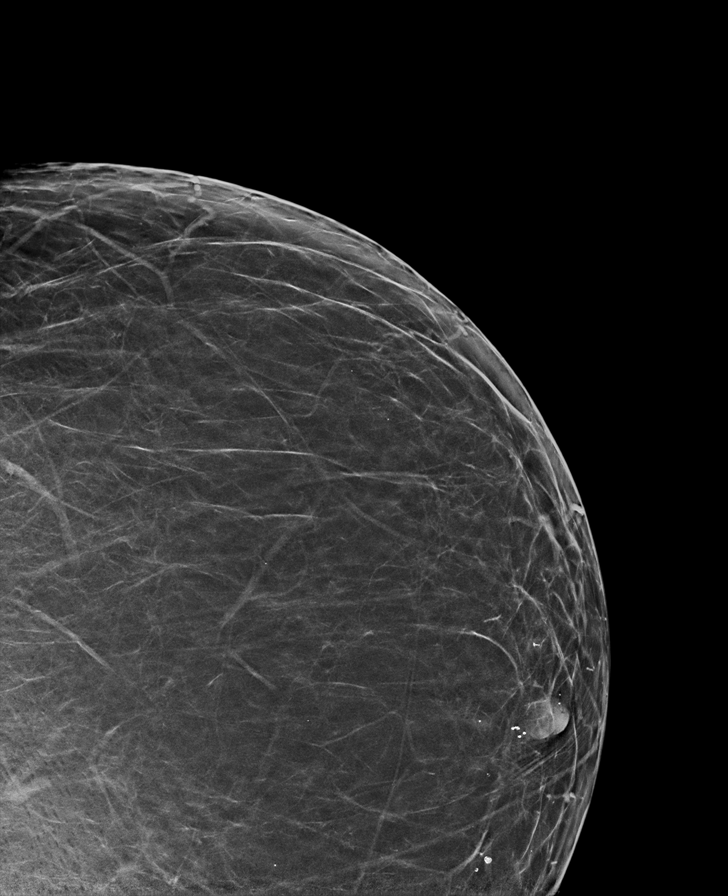

[L MLO tomo · tomo slice 49/96.0]
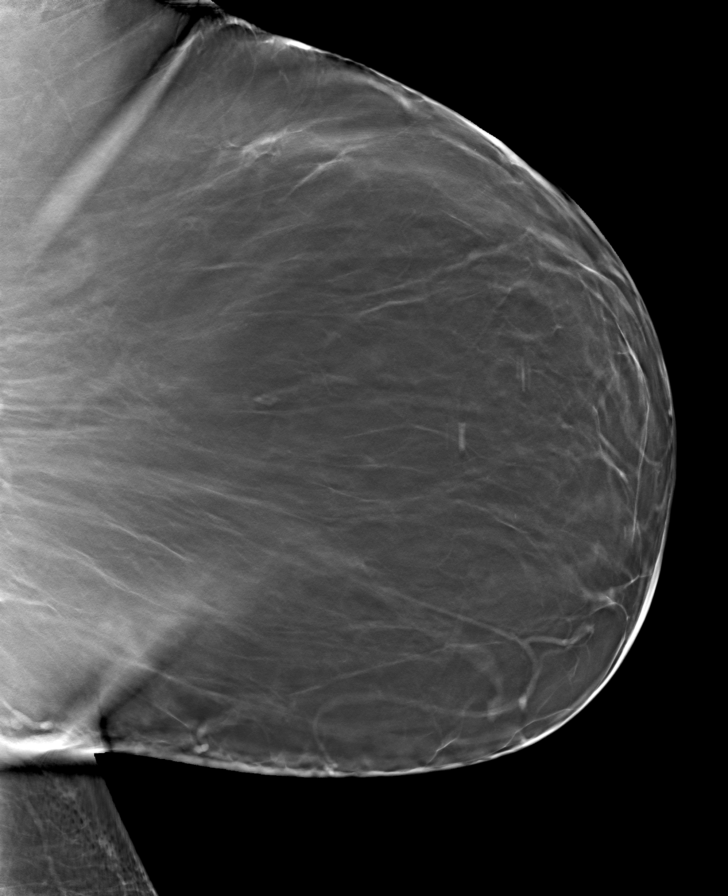

[8 of 40 positions shown; findings below may reference images not displayed]

FINDINGS: There are no findings suspicious for malignancy.
IMPRESSION: No mammographic evidence of malignancy. A result letter of this
screening mammogram will be mailed directly to the patient.

RECOMMENDATION:
Screening mammogram in one year. (Code:[8U])

BI-RADS CATEGORY  1: Negative.

## 2021-02-15 DIAGNOSIS — E1159 Type 2 diabetes mellitus with other circulatory complications: Secondary | ICD-10-CM | POA: Diagnosis not present

## 2021-02-15 DIAGNOSIS — R809 Proteinuria, unspecified: Secondary | ICD-10-CM | POA: Diagnosis not present

## 2021-02-15 DIAGNOSIS — L853 Xerosis cutis: Secondary | ICD-10-CM | POA: Diagnosis not present

## 2021-02-15 DIAGNOSIS — E1169 Type 2 diabetes mellitus with other specified complication: Secondary | ICD-10-CM | POA: Diagnosis not present

## 2021-02-15 DIAGNOSIS — E1142 Type 2 diabetes mellitus with diabetic polyneuropathy: Secondary | ICD-10-CM | POA: Diagnosis not present

## 2021-02-15 DIAGNOSIS — E785 Hyperlipidemia, unspecified: Secondary | ICD-10-CM | POA: Diagnosis not present

## 2021-02-15 DIAGNOSIS — I152 Hypertension secondary to endocrine disorders: Secondary | ICD-10-CM | POA: Diagnosis not present

## 2021-02-15 DIAGNOSIS — E1129 Type 2 diabetes mellitus with other diabetic kidney complication: Secondary | ICD-10-CM | POA: Diagnosis not present

## 2021-02-16 DIAGNOSIS — M5136 Other intervertebral disc degeneration, lumbar region: Secondary | ICD-10-CM | POA: Diagnosis not present

## 2021-02-16 DIAGNOSIS — M62830 Muscle spasm of back: Secondary | ICD-10-CM | POA: Diagnosis not present

## 2021-03-03 DIAGNOSIS — M25562 Pain in left knee: Secondary | ICD-10-CM | POA: Diagnosis not present

## 2021-03-03 DIAGNOSIS — M25561 Pain in right knee: Secondary | ICD-10-CM | POA: Diagnosis not present

## 2021-03-03 DIAGNOSIS — R262 Difficulty in walking, not elsewhere classified: Secondary | ICD-10-CM | POA: Diagnosis not present

## 2021-03-03 DIAGNOSIS — M5451 Vertebrogenic low back pain: Secondary | ICD-10-CM | POA: Diagnosis not present

## 2021-03-24 ENCOUNTER — Inpatient Hospital Stay: Payer: HMO | Attending: Obstetrics and Gynecology | Admitting: Obstetrics and Gynecology

## 2021-03-24 VITALS — BP 146/61 | HR 80 | Temp 97.8°F | Resp 17

## 2021-03-24 DIAGNOSIS — Z79890 Hormone replacement therapy (postmenopausal): Secondary | ICD-10-CM | POA: Diagnosis not present

## 2021-03-24 DIAGNOSIS — I10 Essential (primary) hypertension: Secondary | ICD-10-CM | POA: Diagnosis not present

## 2021-03-24 DIAGNOSIS — Z6841 Body Mass Index (BMI) 40.0 and over, adult: Secondary | ICD-10-CM | POA: Diagnosis not present

## 2021-03-24 DIAGNOSIS — R102 Pelvic and perineal pain: Secondary | ICD-10-CM | POA: Insufficient documentation

## 2021-03-24 DIAGNOSIS — N950 Postmenopausal bleeding: Secondary | ICD-10-CM

## 2021-03-24 DIAGNOSIS — Z7984 Long term (current) use of oral hypoglycemic drugs: Secondary | ICD-10-CM | POA: Insufficient documentation

## 2021-03-24 DIAGNOSIS — C541 Malignant neoplasm of endometrium: Secondary | ICD-10-CM | POA: Diagnosis not present

## 2021-03-24 DIAGNOSIS — Z975 Presence of (intrauterine) contraceptive device: Secondary | ICD-10-CM | POA: Insufficient documentation

## 2021-03-24 DIAGNOSIS — E1165 Type 2 diabetes mellitus with hyperglycemia: Secondary | ICD-10-CM | POA: Insufficient documentation

## 2021-03-24 DIAGNOSIS — R1030 Lower abdominal pain, unspecified: Secondary | ICD-10-CM

## 2021-03-24 NOTE — Progress Notes (Signed)
Gynecologic Oncology Interval Visit   Referring Provider: Dr. Vikki Ports Ward  PCP: Dr. Ellison Hughs  Chief Concern: grade 1 endometrial cancer  Subjective:  Kathryn Maddox is a 70 y.o. G74 female with multiple medical issues including morbid obesity, poorly controlled diabetes, and sleep apnea who is seen in consultation from Dr. Leonides Schanz for grade 1 endometrial cancer, s/p IUD placement 12/02/20 as she is poor surgical candidate.   Bleeding has improved since placement. She presents for EMBx. She does continue to have bleeding but it is lighter. She does have pelvic pain and uses ibuprofen as needed.    Gynecologic Oncology History:  She has a long history of abnormal vaginal bleeding and menorrhagia and anemia due to chronic blood loss going back to her 30's. She had a syncopal episode from losing so much blood. In [place], had 9 D&Cs with hysteroscopy, last one was in 2010, and bleeding always continued. She was treated with progesterone injections to stop bleeding. In '92 she had precancerous cells in uterus and was seeing oncologist for this. Was supposed to get a hysterectomy with oncologist, but had a bowel obstruction and surgery was denied until she was recovered. Based on what she described she had a ventral hernia that was repaired and she does not know if bowel was resected or not. She has not been to gynecologist since 2011 until she was seen by Dr. Leonides Schanz 07/30/2020. She has had postmenopausal bleeding x 7-8 years. She does not have a h/o of vWD and she did not have abnormal bleeding with any of her surgical procedures.   Her work up has included pelvic US, EMBx, and labs.   Pelvic US 08/14/2020 at [facility]: Uterus: Measurements: 7.7 x 5.3 x 5.8 cm = volume: 123 mL. No fibroids or other mass visualized. Endometrium: Thickness: 10 mm.  No focal abnormality visualized. Right ovary: Nonvisualized. Left ovary: Nonvisualized. Other findings: No abnormal free fluid.   08/14/2020 EMBX showed a  WELL DIFFERENTIATED ENDOMETRIAL ADENOCARCINOMA   LH 9.9 FSH 11.4  TSH 1.654 WNL HbA1c 9.2   Per the patient she had a Pap with Dr. Leonides Schanz that was negative.   Problem List: Patient Active Problem List   Diagnosis Date Noted   Endometrial cancer ([place]) 08/26/2020   Past Medical History: Past Medical History:  Diagnosis Date   Carpal tunnel syndrome on both sides 03/2018   Cellulitis and abscess of lower extremity 03/2018   bilateral lower extrems, treated with antibx x 10 days   Diabetes mellitus without complication ([place])    Dyspnea 03/2018   with any exercise, due to weight   Eczema    Endometrial cancer ([place]) 08/26/2020   Hyperlipidemia    Hypertension    Morbid obesity with BMI of 70 and over, adult ([place]) 03/2018   Sleep apnea 03/2018   uses a cpap   Past Surgical History: Past Surgical History:  Procedure Laterality Date   CARPAL TUNNEL RELEASE Left 04/12/2018   Procedure: CARPAL TUNNEL RELEASE;  Surgeon: Hessie Knows, MD;  Location: [facility] ORS;  Service: Orthopedics;  Laterality: Left;   COLONOSCOPY WITH PROPOFOL N/A 12/02/2019   Procedure: COLONOSCOPY WITH PROPOFOL;  Surgeon: Toledo, Benay Pike, MD;  Location: [facility] ENDOSCOPY;  Service: Gastroenterology;  Laterality: N/A;   DILATION AND CURETTAGE OF UTERUS  03/2018   has had 10 d & c's with hysteroscopy   HERNIA REPAIR  8921   umbilical w/ obstructed bowel   LIPOMA EXCISION Right 1997   shoulder   obstructive bowel surgery  01/2010   Past Gynecologic History:  Menarche: 12 Menstrual details: 7 Menses regular:No  Long h/o abnormal vaginal bleeding Last Menstrual Period: unknown HHistory of Abnormal pap:No Last pap: Unknown - need to verify obtained by Dr. Leonides Schanz  OB History: GOP0 OB History  No obstetric history on file.   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[place], now retired  Tobacco Use   Smoking status: Never   Smokeless tobacco: Never  Vaping Use   Vaping Use: Never used  Substance and Sexual Activity   Alcohol use: Yes    Comment: rarely   Drug use: Never   Sexual activity: Not Currently  Other Topics Concern   Not on file  Social History Narrative   Not on file   Social Determinants of Health   Financial Resource Strain: Not on file  Food Insecurity: Not on file  Transportation Needs: Not on file  Physical Activity: Not on file  Stress: Not on file  Social Connections: Not on file  Intimate Partner Violence: Not on file   Allergies: Allergies  Allergen Reactions   Megace [Megestrol] Rash   Penicillins Rash and Other (See Comments)    Has patient had a PCN reaction causing immediate rash, facial/tongue/throat swelling, SOB or lightheadedness with hypotension: Unknown Has patient had a PCN reaction causing severe rash involving mucus membranes or skin necrosis: Unknown Has patient had a PCN reaction that required hospitalization: Unknown Has patient had a PCN reaction occurring within the last 10 years: No If all of the above answers are "NO", then may proceed with Cephalosporin use.    Ibuprofen Hypertension   Pravastatin Other (See Comments)    Muscle pain, extreme constipation   Current Medications: Current Outpatient Medications  Medication Sig Dispense Refill   acetaminophen (TYLENOL) 650 MG CR tablet Take 1,300 mg by mouth every 8 (eight) hours as needed for pain.     losartan (COZAAR) 50 MG tablet Take 50 mg by mouth daily.     MOUNJARO 2.5 MG/0.5ML Pen Inject into the skin.     rosuvastatin (CRESTOR) 5 MG tablet Take 5 mg by mouth daily.     TRULICITY 4.17 EY/8.1KG SOPN      Alpha-Lipoic Acid 200  MG TABS Take 200 mg by mouth 3 (three) times daily. (Patient not taking: No sig reported)     HYDROcodone-acetaminophen (NORCO) 5-325 MG tablet Take 1 tablet by mouth every 4 (four) hours as needed for moderate pain. (Patient not taking: No sig reported) 20 tablet 0   No current facility-administered medications for this visit.   Review of Systems General:  no complaints Skin: no complaints Eyes: no complaints HEENT: no complaints Breasts: no complaints Pulmonary: no complaints Cardiac: no complaints Gastrointestinal: no complaints Genitourinary/Sexual: no complaints Ob/Gyn: as per HPI Musculoskeletal: no complaints Hematology: no complaints Neurologic/Psych: no complaints  Objective:  Physical Examination:  BP (!) 146/61   Pulse 80   Temp 97.8 F (36.6 C)   Resp 17   SpO2 97%   ECOG Performance Status: 1 - Symptomatic but completely ambulatory  GENERAL: Morbidly obese female. Transported via wheelchair CV: RRR Lungs: BCTA ABDOMEN:  Large panniculus. Exam limited due to habitus. Soft, nontender. No masses or ascites.  MSK:  No focal spinal tenderness to palpation. Full range of motion bilaterally in the upper extremities. EXTREMITIES:  2+ bilateral edema  SKIN:  Clear with no obvious rashes or skin changes.  NEURO:  Nonfocal. Well oriented.  Appropriate affect.  Pelvic Exam chaperoned by CMA and RN Pelvic exam: VULVA: normal appearing vulva with no masses, tenderness or lesions, VAGINA: normal appearing vagina with normal color and discharge, no lesions, long vaginal length NEED LONG THIN SPECULUM, CERVIX: normal appearing cervix without discharge or lesions; IUD strings presents, bleeding dark red blood at the os, UTERUS: unable to determine size, ADNEXA: no masses but limited exam, RECTAL: deferred.  Procedure EMBx See consent form.  The risks and benefits of the procedure were reviewed and informed consent obtained. Time out was performed. The patient received  pre-procedure teaching and expressed understanding. The post-procedure instructions were reviewed with the patient and she expressed understanding. The patient does not have any barriers to learning.  Speculum placed in the vaginal vault and cervix identified. Cervix cleansed with Betadine and anterior lip grasped with a tenaculum. The pipelle was inserted - unable to determine length. The specimen was obtained without difficult. The tenaculum was removed and hemostasis adequate   Post-procedure evaluation the patient was stable without complaints.   Lab Review No labs on site  Radiologic Imaging: Above    Assessment:  Kathryn Maddox is a 70 y.o. female diagnosed with grade 1 endometrial cancer 2/22 on endometrial biopsy. Morbid obesity with BMI 72.  MRI with 13 mm stripe. No or minimal myometrial invasion, less than 50%, suggesting stage 1a disease by imaging. S/p IUD 11/2020. EMBx obtained today  Pelvic pain uncertain etiology, may be due to malignancy  Medical co-morbidities complicating care: HTN and diabetes non-insulin dependent, morbid obesity (BMI 72) and sleep apnea Plan:   Problem List Items Addressed This Visit       Genitourinary   Endometrial cancer ([place]) - Primary   Relevant Orders   Surgical pathology   Other Visit Diagnoses     Postmenopausal bleeding       Lower abdominal pain           Follow up EMBx. Given her pain I am concerned, if she has persistent cancer plan for MRI and possibly CT scan. We can consider therapeutic D&C.   But it would depend on the scans.  If negative assess for pain etiology and repeat another biopsy in 3-6 months.   Would previously explored oral progestins in view of her morbid obesity we opted not to proceed.   Continue to optimize glycemic control and other medical issues with her PCP, Dr. Ellison Hughs, and her endocrinologist, Dr. Honor Junes.   [place] [name], MD  I personally had a face to face interaction and evaluated  the patient jointly with the NP, Ms. Beckey Rutter.  I have reviewed her history and available records and have performed the key portions of the physical exam including abdominal exam, pelvic exam with my findings confirming those documented above by the APP.  I have discussed the case with the APP and the patient.  I agree with the above documentation, assessment and plan which was fully formulated by me.  Counseling was completed by me. I performed the procedure.   I personally saw the patient and performed a substantive portion of this encounter  in conjunction with the listed APP as documented above.  [place] [name], MD

## 2021-03-26 ENCOUNTER — Telehealth: Payer: Self-pay

## 2021-03-26 ENCOUNTER — Other Ambulatory Visit: Payer: Self-pay

## 2021-03-26 DIAGNOSIS — C541 Malignant neoplasm of endometrium: Secondary | ICD-10-CM

## 2021-03-26 DIAGNOSIS — R1030 Lower abdominal pain, unspecified: Secondary | ICD-10-CM

## 2021-03-26 NOTE — Telephone Encounter (Signed)
Pathology report for endometrial biopsy noted and sent to Dr. Theora Gianotti for review.

## 2021-03-26 NOTE — Telephone Encounter (Signed)
Dr. Theora Gianotti has reviewed pathology and is requesting MRI pelvis be performed. Order placed. Called and reviewed pathology results and plan for MRI with Ms Bevelyn Buckles. All questions answered. Will be notified once MRI scheduled.

## 2021-03-30 DIAGNOSIS — M25562 Pain in left knee: Secondary | ICD-10-CM | POA: Diagnosis not present

## 2021-03-30 DIAGNOSIS — M25561 Pain in right knee: Secondary | ICD-10-CM | POA: Diagnosis not present

## 2021-03-30 DIAGNOSIS — M5451 Vertebrogenic low back pain: Secondary | ICD-10-CM | POA: Diagnosis not present

## 2021-03-30 DIAGNOSIS — R262 Difficulty in walking, not elsewhere classified: Secondary | ICD-10-CM | POA: Diagnosis not present

## 2021-04-02 DIAGNOSIS — M25561 Pain in right knee: Secondary | ICD-10-CM | POA: Diagnosis not present

## 2021-04-02 DIAGNOSIS — M5451 Vertebrogenic low back pain: Secondary | ICD-10-CM | POA: Diagnosis not present

## 2021-04-02 DIAGNOSIS — M25562 Pain in left knee: Secondary | ICD-10-CM | POA: Diagnosis not present

## 2021-04-02 DIAGNOSIS — R262 Difficulty in walking, not elsewhere classified: Secondary | ICD-10-CM | POA: Diagnosis not present

## 2021-04-05 DIAGNOSIS — M5451 Vertebrogenic low back pain: Secondary | ICD-10-CM | POA: Diagnosis not present

## 2021-04-05 DIAGNOSIS — M25561 Pain in right knee: Secondary | ICD-10-CM | POA: Diagnosis not present

## 2021-04-05 DIAGNOSIS — R262 Difficulty in walking, not elsewhere classified: Secondary | ICD-10-CM | POA: Diagnosis not present

## 2021-04-05 DIAGNOSIS — M25562 Pain in left knee: Secondary | ICD-10-CM | POA: Diagnosis not present

## 2021-04-07 ENCOUNTER — Ambulatory Visit
Admission: RE | Admit: 2021-04-07 | Discharge: 2021-04-07 | Disposition: A | Discharge status: Home / Self Care | Payer: HMO | Source: Ambulatory Visit | Attending: Obstetrics and Gynecology | Admitting: Obstetrics and Gynecology

## 2021-04-07 DIAGNOSIS — C541 Malignant neoplasm of endometrium: Secondary | ICD-10-CM | POA: Insufficient documentation

## 2021-04-07 DIAGNOSIS — R9389 Abnormal findings on diagnostic imaging of other specified body structures: Secondary | ICD-10-CM | POA: Diagnosis not present

## 2021-04-07 DIAGNOSIS — N8500 Endometrial hyperplasia, unspecified: Secondary | ICD-10-CM | POA: Diagnosis not present

## 2021-04-07 DIAGNOSIS — R1030 Lower abdominal pain, unspecified: Secondary | ICD-10-CM | POA: Diagnosis not present

## 2021-04-07 DIAGNOSIS — K5730 Diverticulosis of large intestine without perforation or abscess without bleeding: Secondary | ICD-10-CM | POA: Diagnosis not present

## 2021-04-07 IMAGING — MR MR PELVIS WO/W CM
13 of 19 series · 32 of 48 positions shown · IV contrast (gadavist)
Comparison: Ultrasound on [DATE], and MRI on [DATE]

CLINICAL DATA: Follow-up endometrial carcinoma.

EXAM:
MRI PELVIS WITHOUT AND WITH CONTRAST
TECHNIQUE: Multiplanar multisequence MR imaging of the pelvis was performed
both before and after administration of intravenous contrast.
CONTRAST:  10mL GADAVIST GADOBUTROL 1 MMOL/ML IV SOLN

[Series 2: T2 · coronal · 5.0mm · 1.41mm/px · 3 of 40 slices shown (1 of 2)]
[im 1/40]
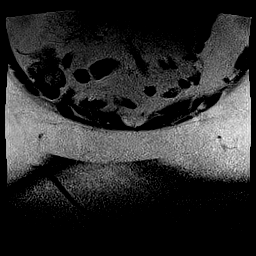
[im 20/40]
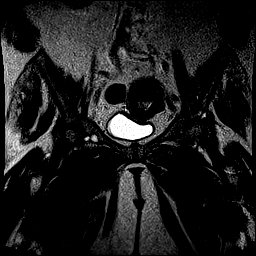
[im 40/40]
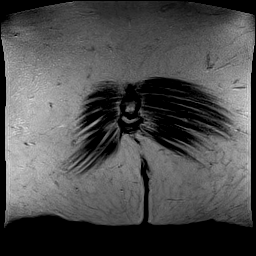

[Series 3: sag tse · sagittal · 5.0mm · 0.75mm/px · 1 of 33 slices shown]
[im 1/33]
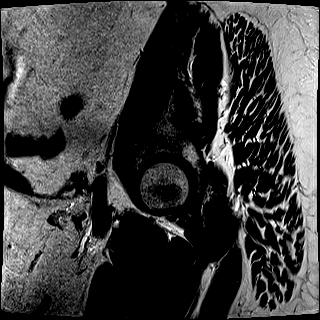

[Series 4: T2 fat-sat · axial · 5.0mm · 0.90mm/px · 1 of 37 slices shown]
[im 1/37]
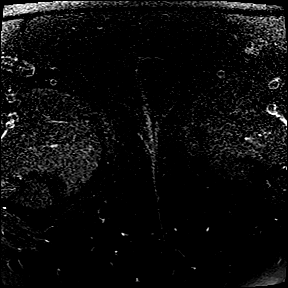

[Series 7: T2 · oblique · 5.0mm · 0.75mm/px · 1 of 37 slices shown (2 of 2)]
[im 1/37]
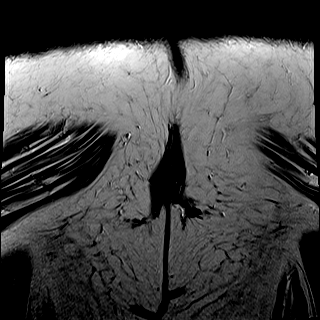

[Series 10: DIXON · axial · 3.0mm · 1.19mm/px · z∈[-52,+161]mm · 3 of 72 slices shown (1 of 2)]
[im 1/72]
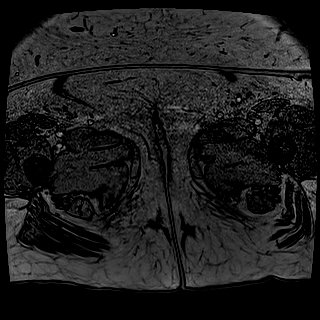
[im 36/72]
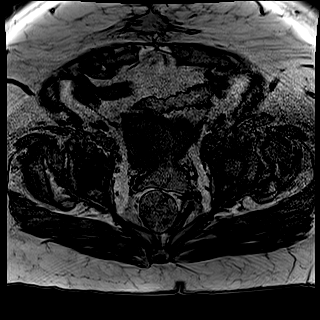
[im 72/72]
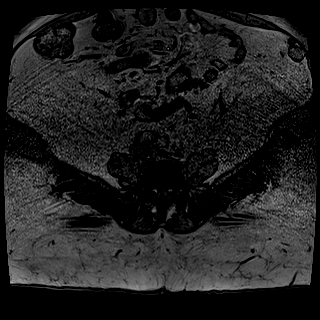

[Series 11: DIXON · axial · 3.0mm · 1.19mm/px · z∈[-52,+161]mm · 3 of 72 slices shown (2 of 2)]
[im 1/72]
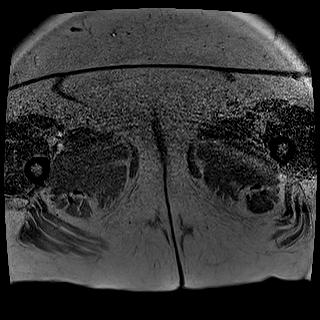
[im 36/72]
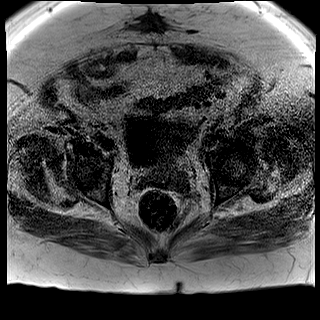
[im 72/72]
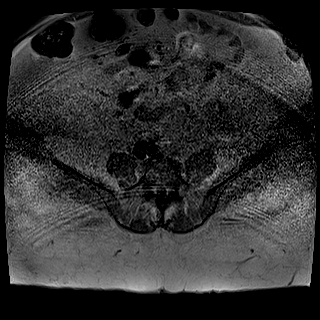

[Series 12: ax dwi_tracew · axial · 6.0mm · 1.19mm/px · z∈[-72,+165]mm · 4 of 102 slices shown]
[im 1/102]
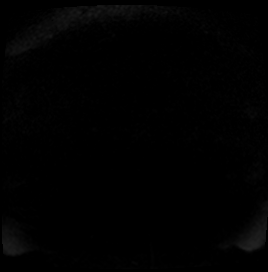
[im 34/102]
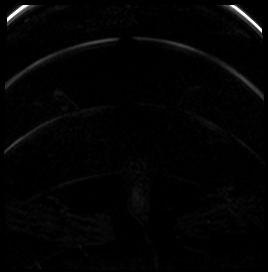
[im 68/102]
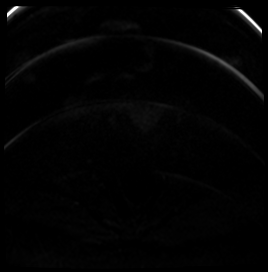
[im 102/102]
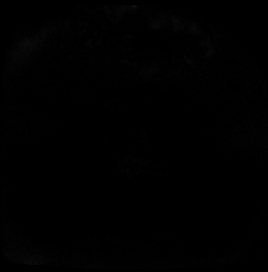

[Series 13: ax dwi_adc · axial · 6.0mm · 1.19mm/px · 1 of 34 slices shown]
[im 1/34]
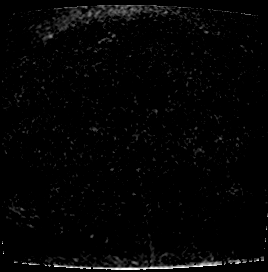

[Series 15: T1 dynamic fat-sat · oblique · 3.0mm · 0.47mm/px · 3 of 80 slices shown (1 of 5)]
[im 1/80]
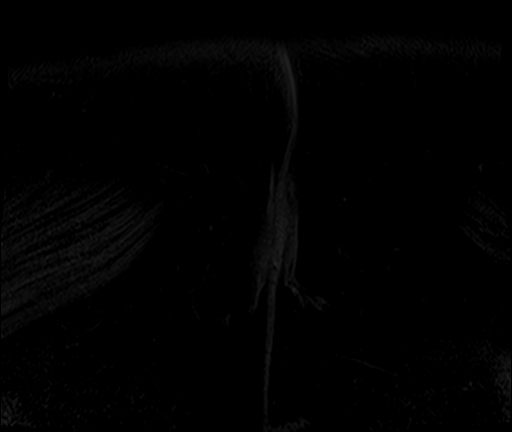
[im 40/80]
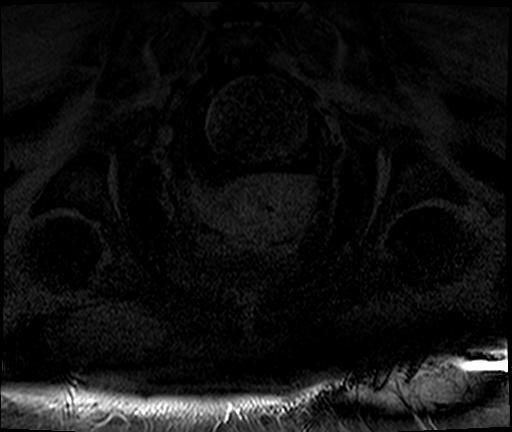
[im 80/80]
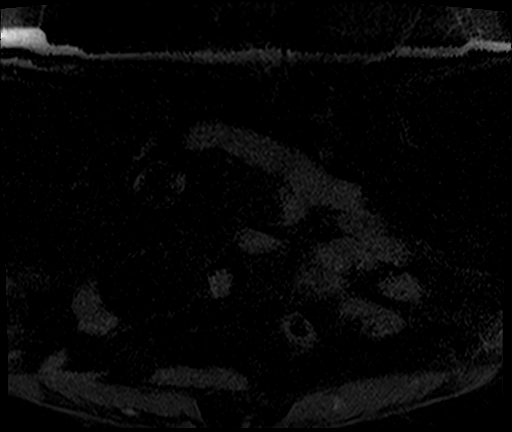

[Series 18: T1 dynamic fat-sat · oblique · 3.0mm · 0.47mm/px · 3 of 80 slices shown (2 of 5)]
[im 1/80]
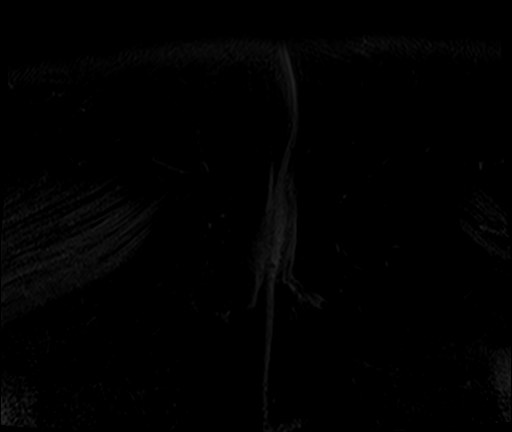
[im 40/80]
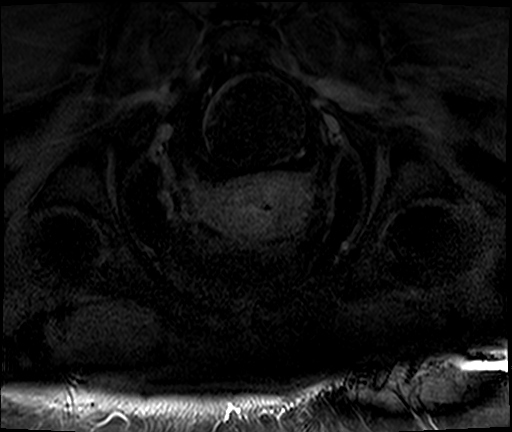
[im 80/80]
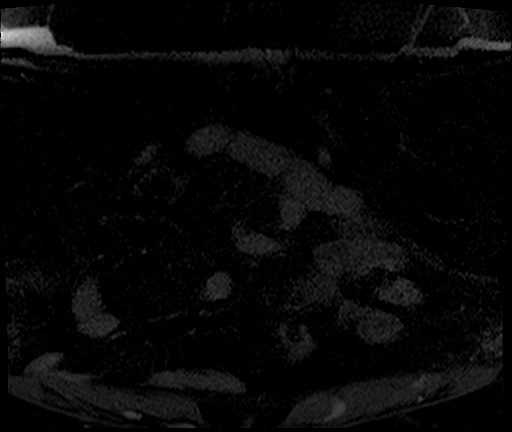

[Series 19: T1 dynamic fat-sat · oblique · 3.0mm · 0.47mm/px · 3 of 80 slices shown (3 of 5)]
[im 1/80]
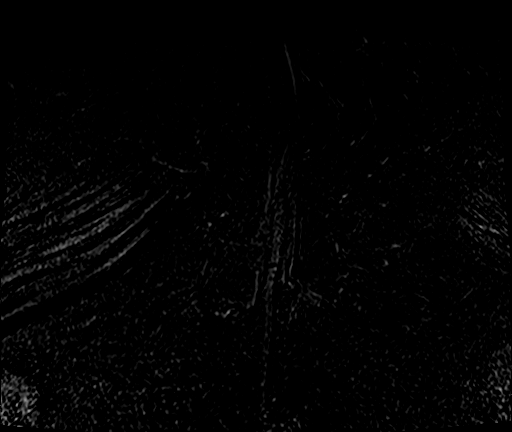
[im 40/80]
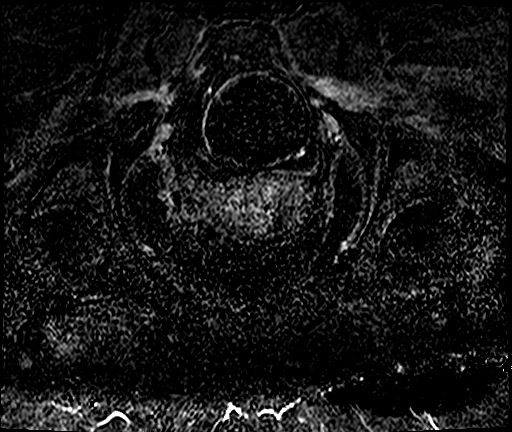
[im 80/80]
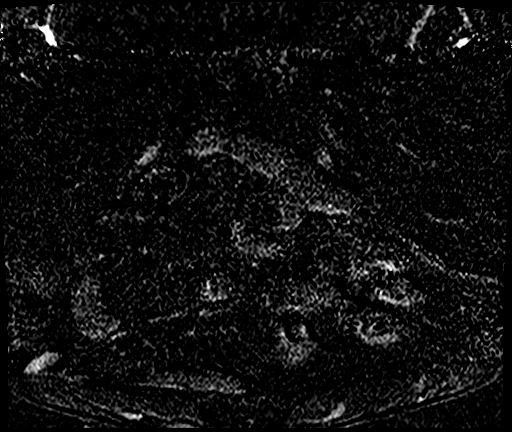

[Series 22: T1 dynamic fat-sat · oblique · 3.0mm · 0.47mm/px · 3 of 80 slices shown (4 of 5)]
[im 1/80]
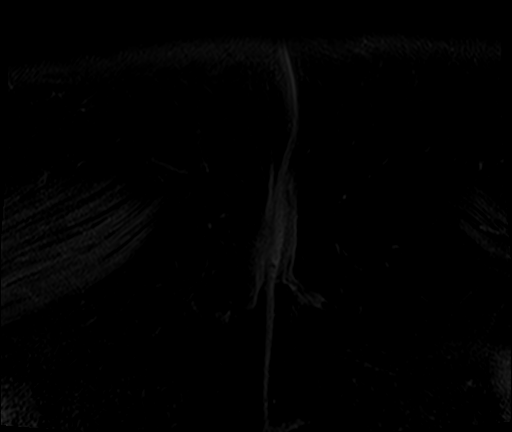
[im 40/80]
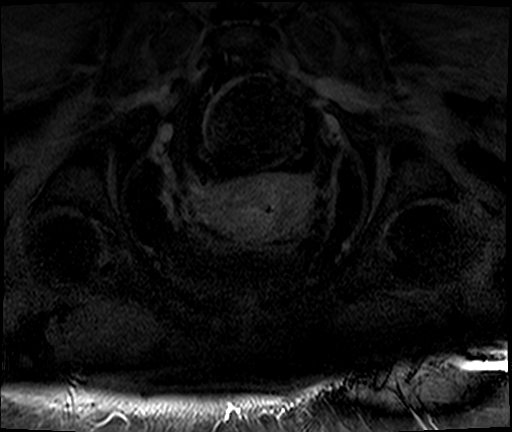
[im 80/80]
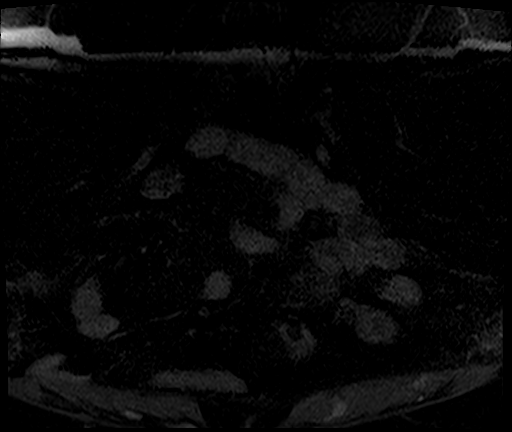

[Series 23: T1 dynamic fat-sat · oblique · 3.0mm · 0.47mm/px · 3 of 80 slices shown (5 of 5)]
[im 1/80]
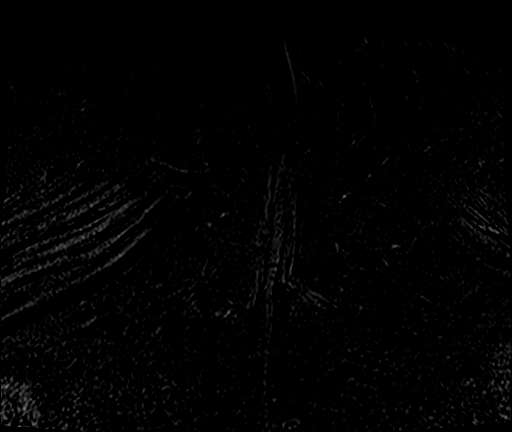
[im 40/80]
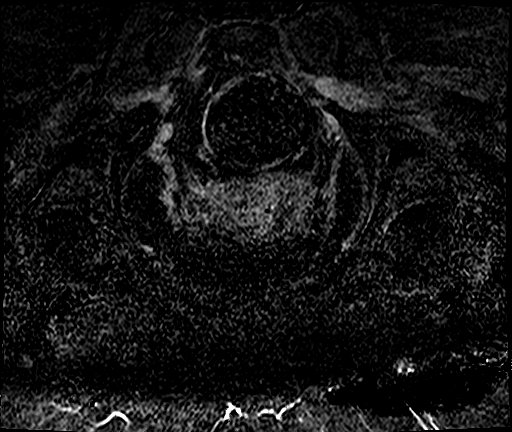
[im 80/80]
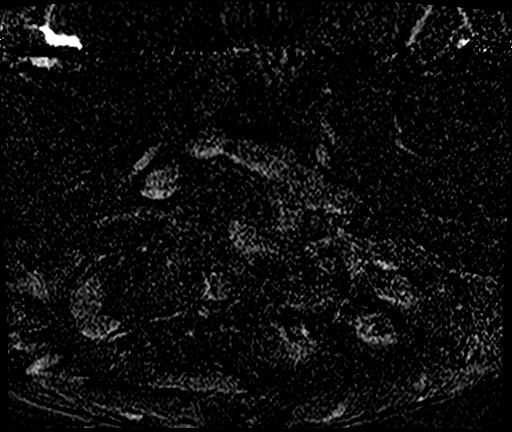

[32 of 48 positions shown; findings below may reference images not displayed]

FINDINGS: Lower Urinary Tract: No urinary bladder or urethral abnormality
identified.

Bowel: Sigmoid colon diverticulosis, without evidence of
diverticulitis.

Vascular/Lymphatic: Sub-cm bilateral iliac lymph nodes are noted,
which are stable. No pathologically enlarged lymph nodes identified.

Reproductive:

-- Uterus: Measures 10.2 x 6.3 x 7.5 cm (volume = 250 cm^3).
Endometrial thickness measures 13 mm, without significant change
compared to previous study. IUD again noted. Indistinct
endometrial-myometrial junction is suspicious for superficial
myometrial invasion, however there is no evidence of deep (> 50%)
myometrial invasion. Cervix and vagina are unremarkable.

-- Right ovary: Appears normal. No ovarian or adnexal masses
identified.

-- Left ovary: Appears normal. No ovarian or adnexal masses
identified.

Other: No peritoneal thickening or abnormal free fluid.

Musculoskeletal:  Unremarkable.
IMPRESSION: Stable endometrial thickening measuring 13 mm, with IUD again noted.
Probable superficial myometrial invasion, also without significant
change. (FIGO stage IA).

No evidence of pelvic metastatic disease.

Sigmoid colon diverticulosis, without evidence of diverticulitis.

## 2021-04-07 MED ORDER — GADOBUTROL 1 MMOL/ML IV SOLN
10.0000 mL | Freq: Once | INTRAVENOUS | Status: AC | PRN
  Administered 2021-04-07: 10 mL via INTRAVENOUS

## 2021-04-08 DIAGNOSIS — R262 Difficulty in walking, not elsewhere classified: Secondary | ICD-10-CM | POA: Diagnosis not present

## 2021-04-08 DIAGNOSIS — M25561 Pain in right knee: Secondary | ICD-10-CM | POA: Diagnosis not present

## 2021-04-08 DIAGNOSIS — M25562 Pain in left knee: Secondary | ICD-10-CM | POA: Diagnosis not present

## 2021-04-08 DIAGNOSIS — M5451 Vertebrogenic low back pain: Secondary | ICD-10-CM | POA: Diagnosis not present

## 2021-04-12 ENCOUNTER — Telehealth: Payer: Self-pay

## 2021-04-12 NOTE — Telephone Encounter (Signed)
Kathryn Maddox has been provided results of pathology and MRI. Message sent to Dr. Theora Gianotti to call Kathryn Maddox regarding further questions she has.

## 2021-04-12 NOTE — Telephone Encounter (Signed)
Telephone office visit arranged for 10/26 at 1300 with Dr. Theora Gianotti.

## 2021-04-13 DIAGNOSIS — M25561 Pain in right knee: Secondary | ICD-10-CM | POA: Diagnosis not present

## 2021-04-13 DIAGNOSIS — M5451 Vertebrogenic low back pain: Secondary | ICD-10-CM | POA: Diagnosis not present

## 2021-04-13 DIAGNOSIS — R262 Difficulty in walking, not elsewhere classified: Secondary | ICD-10-CM | POA: Diagnosis not present

## 2021-04-13 DIAGNOSIS — M25562 Pain in left knee: Secondary | ICD-10-CM | POA: Diagnosis not present

## 2021-04-14 ENCOUNTER — Inpatient Hospital Stay (HOSPITAL_BASED_OUTPATIENT_CLINIC_OR_DEPARTMENT_OTHER): Payer: HMO | Admitting: Obstetrics and Gynecology

## 2021-04-14 ENCOUNTER — Encounter: Payer: Self-pay | Admitting: Obstetrics and Gynecology

## 2021-04-14 ENCOUNTER — Other Ambulatory Visit: Payer: Self-pay

## 2021-04-14 DIAGNOSIS — C541 Malignant neoplasm of endometrium: Secondary | ICD-10-CM

## 2021-04-14 NOTE — Progress Notes (Addendum)
Gynecologic Oncology Telephone Visit   Referring Provider: Dr. Vikki Ports Maddox  PCP: Dr. Ellison Maddox  Chief Concern: grade 1 endometrial cancer  Subjective:  Kathryn Maddox is a 70 y.o. G38 female with multiple medical issues including morbid obesity, poorly controlled diabetes, and sleep apnea who is seen in consultation from Dr. Leonides Maddox for grade 1 endometrial cancer, s/p IUD placement 12/02/20 as she is poor surgical candidate.   EMBx 03/24/2021.  DIAGNOSIS:  A. ENDOMETRIUM; PIPELLE BIOPSY:  - ENDOMETRIOID CARCINOMA, LOW-GRADE (FIGO 1), WITH TREATMENT EFFECT.   Pelvic MRI 04/07/2021 FINDINGS: Lower Urinary Tract: No urinary bladder or urethral abnormality identified.   Bowel: Sigmoid colon diverticulosis, without evidence of diverticulitis.   Vascular/Lymphatic: Sub-cm bilateral iliac lymph nodes are noted, which are stable. No pathologically enlarged lymph nodes identified.   Reproductive:   -- Uterus: Measures 10.2 x 6.3 x 7.5 cm (volume = 250 cm^3). Endometrial thickness measures 13 mm, without significant change compared to previous study. IUD again noted. Indistinct endometrial-myometrial junction is suspicious for superficial myometrial invasion, however there is no evidence of deep (> 50%) myometrial invasion. Cervix and vagina are unremarkable.   -- Right ovary: Appears normal. No ovarian or adnexal masses identified.   -- Left ovary: Appears normal. No ovarian or adnexal masses identified.   Other: No peritoneal thickening or abnormal free fluid.   Musculoskeletal:  Unremarkable.   IMPRESSION: Stable endometrial thickening measuring 13 mm, with IUD again noted. Probable superficial myometrial invasion, also without significant change. (FIGO stage IA).   No evidence of pelvic metastatic disease.   Sigmoid colon diverticulosis, without evidence of diverticulitis.   She was previously on Megace and had a rash.    Assessment:  Kathryn Maddox is a 70 y.o.  female diagnosed with grade 1 endometrial cancer 2/22 on endometrial biopsy. Morbid obesity with BMI 72.  MRI with 13 mm stripe. No or minimal myometrial invasion, less than 50%, suggesting stage 1a disease by imaging. S/p IUD 11/2020. EMBx demonstrates persistent disease. MRI <50% invasion.   Pelvic pain uncertain etiology, given MRI most likely not due to malignancy. Symptoms have improved.   Medical co-morbidities complicating care: HTN and diabetes non-insulin dependent, morbid obesity, and sleep apnea Plan:   Problem List Items Addressed This Visit       Genitourinary   Endometrial cancer ([place]) - Primary    We discussed options including adding norethindrone (information provided), radiation therapy. I do not feel surgical management of ideal options.  We can consider therapeutic D&C.    Would previously explored oral progestins in view of her morbid obesity we opted not to proceed. We discussed using norethindrone and MPA as she had a prior reaction to Megace. I provided information regarding norethindrone and MPA via MyChart. She is interested in this approach, will review the information, and message [place] if she would like a prescription. I also reached out to Dr. Leonia Maddox, PharmD, to determine likelihood of cross reaction with these progestins compared to Megace and DVT risk.   Plan repeat another biopsy in 3-6 months.   Continue to optimize glycemic control, review diverticulosis diagnosis/management, and other medical issues with her PCP, Dr. Ellison Maddox, and her endocrinologist, Dr. Honor Maddox.    I connected with  Kathryn Maddox on 04/14/21 for the telephone visit and verified that I am speaking with the correct person. The visit was performed in the [place] [facility] in the clinic. I, the provider was in the clinic. The patient agreed to proceed with telephone visit; she  was not in the clinic.  A total of at least 30 minutes were spent with the patient/family today; >50%  was spent in education, counseling and coordination of care for endometrial cancer.    Kathryn Kleve [name], MD  ADDENDUM:  Dr. Keene Maddox was able to review the medications. The other progestins should be ok to use even with the history of MA reaction. It appears that MPA  has the higher VTE risk. Plan to recommend norethindrone. Either the 5 mg one daily dosing or twice daily dosing. I will message Kathryn Maddox via [place].  Kathryn Paradiso [name], MD

## 2021-04-15 ENCOUNTER — Other Ambulatory Visit: Payer: Self-pay | Admitting: Nurse Practitioner

## 2021-04-15 DIAGNOSIS — N950 Postmenopausal bleeding: Secondary | ICD-10-CM | POA: Insufficient documentation

## 2021-05-06 ENCOUNTER — Telehealth: Payer: Self-pay

## 2021-05-06 ENCOUNTER — Other Ambulatory Visit: Payer: Self-pay | Admitting: Nurse Practitioner

## 2021-05-06 DIAGNOSIS — C541 Malignant neoplasm of endometrium: Secondary | ICD-10-CM

## 2021-05-06 MED ORDER — NORETHINDRONE ACETATE 5 MG PO TABS: 5.0000 mg | ORAL_TABLET | Freq: Two times a day (BID) | ORAL | 1 refills | Status: DC

## 2021-05-06 NOTE — Progress Notes (Signed)
Prescription sent for norethindrone 5 mg BID per Dr. Theora Gianotti

## 2021-05-06 NOTE — Telephone Encounter (Signed)
Called and left voicemail with Ms. Mcenery regarding prescription for norethindrone.

## 2021-05-06 NOTE — Telephone Encounter (Signed)
Kathryn Maddox returned call. She has not received prescription for norethindrone. Spoke with Beckey Rutter NP, and she will send prescription. Kathryn Maddox is aware.

## 2021-05-24 ENCOUNTER — Telehealth: Payer: Self-pay

## 2021-05-24 NOTE — Telephone Encounter (Signed)
Received call from Kathryn Maddox with concerns regarding her norethindrone. She started taking it approximately 2.5 weeks ago. She has been having pain in her pelvis that has been dating back to over a year ago. It is daily and feels like a stabbing knife that is being turned. Typically happens in the afternoon and last 4-6 hours. This pain has gotten worse since starting norethindrone and is happening 2-3 times daily. No change in vaginal discharge which remains clear pink. Note being forwarded to NP to discuss with patient.

## 2021-05-26 DIAGNOSIS — G4733 Obstructive sleep apnea (adult) (pediatric): Secondary | ICD-10-CM | POA: Diagnosis not present

## 2021-05-31 ENCOUNTER — Other Ambulatory Visit: Payer: Self-pay

## 2021-05-31 ENCOUNTER — Inpatient Hospital Stay: Payer: HMO

## 2021-05-31 ENCOUNTER — Inpatient Hospital Stay (HOSPITAL_BASED_OUTPATIENT_CLINIC_OR_DEPARTMENT_OTHER): Payer: HMO | Admitting: Nurse Practitioner

## 2021-05-31 ENCOUNTER — Inpatient Hospital Stay: Payer: HMO | Attending: Nurse Practitioner

## 2021-05-31 ENCOUNTER — Encounter: Payer: Self-pay | Admitting: Nurse Practitioner

## 2021-05-31 ENCOUNTER — Telehealth: Payer: Self-pay | Admitting: Nurse Practitioner

## 2021-05-31 VITALS — BP 162/68 | HR 93 | Ht 61.0 in | Wt 375.8 lb

## 2021-05-31 DIAGNOSIS — N950 Postmenopausal bleeding: Secondary | ICD-10-CM

## 2021-05-31 DIAGNOSIS — E119 Type 2 diabetes mellitus without complications: Secondary | ICD-10-CM | POA: Diagnosis not present

## 2021-05-31 DIAGNOSIS — R1030 Lower abdominal pain, unspecified: Secondary | ICD-10-CM | POA: Diagnosis not present

## 2021-05-31 DIAGNOSIS — C541 Malignant neoplasm of endometrium: Secondary | ICD-10-CM | POA: Insufficient documentation

## 2021-05-31 DIAGNOSIS — K5730 Diverticulosis of large intestine without perforation or abscess without bleeding: Secondary | ICD-10-CM | POA: Insufficient documentation

## 2021-05-31 DIAGNOSIS — I10 Essential (primary) hypertension: Secondary | ICD-10-CM | POA: Insufficient documentation

## 2021-05-31 LAB — CBC WITH DIFFERENTIAL/PLATELET
Abs Immature Granulocytes: 0.02 10*3/uL (ref 0.00–0.07)
Basophils Absolute: 0 10*3/uL (ref 0.0–0.1)
Basophils Relative: 1 %
Eosinophils Absolute: 0.1 10*3/uL (ref 0.0–0.5)
Eosinophils Relative: 2 %
HCT: 37.7 % (ref 36.0–46.0)
Hemoglobin: 12.4 g/dL (ref 12.0–15.0)
Immature Granulocytes: 0 %
Lymphocytes Relative: 20 %
Lymphs Abs: 1.4 10*3/uL (ref 0.7–4.0)
MCH: 28.2 pg (ref 26.0–34.0)
MCHC: 32.9 g/dL (ref 30.0–36.0)
MCV: 85.9 fL (ref 80.0–100.0)
Monocytes Absolute: 0.9 10*3/uL (ref 0.1–1.0)
Monocytes Relative: 12 %
Neutro Abs: 4.8 10*3/uL (ref 1.7–7.7)
Neutrophils Relative %: 65 %
Platelets: 195 10*3/uL (ref 150–400)
RBC: 4.39 MIL/uL (ref 3.87–5.11)
RDW: 13.3 % (ref 11.5–15.5)
WBC: 7.3 10*3/uL (ref 4.0–10.5)
nRBC: 0 % (ref 0.0–0.2)

## 2021-05-31 LAB — IRON AND TIBC
Iron: 66 ug/dL (ref 28–170)
Saturation Ratios: 16 % (ref 10.4–31.8)
TIBC: 416 ug/dL (ref 250–450)
UIBC: 350 ug/dL

## 2021-05-31 LAB — FERRITIN: Ferritin: 72 ng/mL (ref 11–307)

## 2021-05-31 LAB — SAMPLE TO BLOOD BANK

## 2021-05-31 MED ORDER — TRAMADOL HCL 50 MG PO TABS: 50.0000 mg | ORAL_TABLET | Freq: Four times a day (QID) | ORAL | 0 refills | Status: DC | PRN

## 2021-05-31 NOTE — Telephone Encounter (Signed)
Received call from patient. She has been saturating 1-2 pads per hour since Saturday passing heavy clots. Increased pain unrelieved by OTCs. Will have her come in to [facility] this morning to check counts. Will reach out to Dr. Theora Gianotti for input. Plan for possible endometrial biopsy this Wednesday.

## 2021-05-31 NOTE — Progress Notes (Signed)
[facility] [place] [facility] [place]. [facility] [address] [place], [place] [address] [phone] (phone) [phone] (fax)  Patient Care Team: Sofie Hartigan, MD as PCP - General (Family Medicine) Clent Jacks, RN as Oncology Nurse Navigator   Name of the patient: Kathryn Maddox  [id number]  1950-11-13   Date of visit: 05/31/21  Diagnosis- Endometrial Cancer  Chief complaint/ Reason for visit- vaginal bleeding  Heme/Onc history:  Oncology History   No history exists.    Interval history- Patient is 70 year old female who presents to clinic for complaints of acute vaginal bleeidng, heavy at times. Has been using chuck pads to catch bleeding. Reports passing clots. She's had increased pelvic pain which she rates as severe at times. Was previously taking norethindrone 5 mg twice a day but felt that her pain worsened and she discontinued medication. She lives alone and is worried about the heavy bleeding.   Review of systems- Review of Systems  Constitutional:  Negative for chills, fever, malaise/fatigue and weight loss.  HENT:  Negative for hearing loss, nosebleeds, sore throat and tinnitus.   Eyes:  Negative for blurred vision and double vision.  Respiratory:  Negative for cough, hemoptysis, shortness of breath and wheezing.   Cardiovascular:  Negative for chest pain, palpitations and leg swelling.  Gastrointestinal:  Negative for abdominal pain, blood in stool, constipation, diarrhea, melena, nausea and vomiting.  Genitourinary:  Negative for dysuria and urgency.  Musculoskeletal:  Negative for back pain, falls, joint pain and myalgias.  Skin:  Negative for itching and rash.  Neurological:  Negative for dizziness, tingling, sensory change, loss of consciousness, weakness and headaches.  Endo/Heme/Allergies:  Negative for environmental allergies. Does not bruise/bleed  easily.  Psychiatric/Behavioral:  Negative for depression. The patient is not nervous/anxious and does not have insomnia.     Current treatment- norethindrone & IUD  Allergies  Allergen Reactions   Megace [Megestrol] Rash   Penicillins Rash and Other (See Comments)    Has patient had a PCN reaction causing immediate rash, facial/tongue/throat swelling, SOB or lightheadedness with hypotension: Unknown Has patient had a PCN reaction causing severe rash involving mucus membranes or skin necrosis: Unknown Has patient had a PCN reaction that required hospitalization: Unknown Has patient had a PCN reaction occurring within the last 10 years: No If all of the above answers are "NO", then may proceed with Cephalosporin use.    Ibuprofen Hypertension   Pravastatin Other (See Comments)    Muscle pain, extreme constipation    Past Medical History:  Diagnosis Date   Carpal tunnel syndrome on both sides 03/2018   Cellulitis and abscess of lower extremity 03/2018   bilateral lower extrems, treated with antibx x 10 days   Diabetes mellitus without complication ([place])    Dyspnea 03/2018   with any exercise, due to weight   Eczema    Endometrial cancer ([place]) 08/26/2020   Hyperlipidemia    Hypertension    Morbid obesity with BMI of 70 and over, adult ([place]) 03/2018   Sleep apnea 03/2018   uses a cpap    Past Surgical History:  Procedure Laterality Date   CARPAL TUNNEL RELEASE Left 04/12/2018   Procedure: CARPAL TUNNEL RELEASE;  Surgeon: Hessie Knows, MD;  Location: [facility] ORS;  Service: Orthopedics;  Laterality: Left;   COLONOSCOPY WITH PROPOFOL N/A 12/02/2019   Procedure: COLONOSCOPY WITH PROPOFOL;  Surgeon: Alice Reichert, Benay Pike, MD;  Location: [facility] ENDOSCOPY;  Service: Gastroenterology;  Laterality: N/A;   DILATION AND CURETTAGE OF UTERUS  03/2018   has had 10 d & c's with hysteroscopy   HERNIA REPAIR  8338   umbilical w/ obstructed bowel   LIPOMA EXCISION Right 1997   shoulder   obstructive  bowel surgery  01/2010    Social History   Socioeconomic History   Marital status: Divorced    Spouse name: Not on file   Number of children: Not on file   Years of education: Not on file   Highest education level: Not on file  Occupational History   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[place], now retired  Tobacco Use   Smoking status: Never   Smokeless tobacco: Never  Vaping Use   Vaping Use: Never used  Substance and Sexual Activity   Alcohol use: Yes    Comment: rarely   Drug use: Never   Sexual activity: Not Currently  Other Topics Concern   Not on file  Social History Narrative   Not on file   Social Determinants of Health   Financial Resource Strain: Not on file  Food Insecurity: Not on file  Transportation Needs: Not on file  Physical Activity: Not on file  Stress: Not on file  Social Connections: Not on file  Intimate Partner Violence: Not on file    Family History  Problem Relation Age of Onset   Breast cancer Neg Hx      Current Outpatient Medications:    acetaminophen (TYLENOL) 650 MG CR tablet, Take 1,300 mg by mouth every 8 (eight) hours as needed for pain., Disp: , Rfl:    Easy Touch Lancets 30G/Twist MISC, USE 1 EACH ONE TIME DAILY AS INSTRUCTED BY YOUR DOCTOR, Disp: , Rfl:    FREESTYLE LITE test strip, 1 each daily., Disp: , Rfl:    losartan (COZAAR) 50 MG tablet, Take 50 mg by mouth daily., Disp: , Rfl:    MOUNJARO 2.5 MG/0.5ML Pen, Inject into the skin., Disp: , Rfl:    nystatin (MYCOSTATIN/NYSTOP) powder, Apply topically 2 (two) times daily., Disp: , Rfl:    rosuvastatin (CRESTOR) 5 MG tablet, Take 5 mg by mouth daily., Disp: , Rfl:   Physical exam:  Vitals:   05/31/21 1100  BP: (!) 162/68  Pulse: 93  Weight: (!) 375 lb 12.8 oz (170.5 kg)  Height: 5\' 1"  (1.549 m)   Physical Exam Constitutional:      General: She is not in acute distress.    Appearance: She is well-developed. She is obese. She is not  ill-appearing.  HENT:     Mouth/Throat:     Pharynx: No oropharyngeal exudate.  Eyes:     General: No scleral icterus. Cardiovascular:     Rate and Rhythm: Normal rate and regular rhythm.  Pulmonary:     Effort: Pulmonary effort is normal.     Breath sounds: Normal breath sounds.  Abdominal:     General: There is no distension.     Palpations: Abdomen is soft.     Tenderness: There is no abdominal tenderness. There is no rebound.  Musculoskeletal:        General: No tenderness or deformity.     Right lower leg: No edema.     Left lower leg: No edema.     Comments: wheelchair  Skin:    General: Skin is warm and dry.     Coloration: Skin is not pale.  Neurological:     General: No focal deficit present.     Mental Status: She is alert and oriented to person, place, and time.     Motor: No weakness.  Psychiatric:        Mood and Affect: Mood normal.        Behavior: Behavior normal.     No flowsheet data found. CBC Latest Ref Rng & Units 05/31/2021  WBC 4.0 - 10.5 K/uL 7.3  Hemoglobin 12.0 - 15.0 g/dL 12.4  Hematocrit 36.0 - 46.0 % 37.7  Platelets 150 - 400 K/uL 195    No images are attached to the encounter.  No results found.  Assessment and plan- Patient is a 69 y.o. female diagnosed with endometrial cancer, grade 1, who presents to [facility] clinic for pelvic pain and bleeding.   Vaginal bleeding- no evidence of anemia. Counts are stable and no indication for transfusion. Iron studies also stable and no indication for IV iron. Vaginal bleeding is of unclear etiology but I would recommend moving up her endometrial biopsy. Unclear if pelvic pain and bleeding is reflective of intolerance to norethindrone or worsening disease. Will get her scheduled to see gyn-onc for endometrial biopsy but could also consider D&C which she has tolerated in the past.  Pelvic pain- avoid nsaids. Unrelieved by tylenol. Will send low dose tramadol for trial. Precautions reviewed.    Follow up with gyn-onc. RTC if bleeding persists or worsens in interim.    Visit Diagnosis 1. Postmenopausal bleeding   2. Lower abdominal pain   3. Endometrial cancer [facility])     Patient expressed understanding and was in agreement with this plan. She also understands that She can call clinic at any time with any questions, concerns, or complaints.   Thank you for allowing me to participate in the care of this very pleasant patient.   Beckey Rutter, DNP, AGNP-C [place] [facility] [place]

## 2021-06-04 DIAGNOSIS — E1169 Type 2 diabetes mellitus with other specified complication: Secondary | ICD-10-CM | POA: Diagnosis not present

## 2021-06-04 DIAGNOSIS — E1159 Type 2 diabetes mellitus with other circulatory complications: Secondary | ICD-10-CM | POA: Diagnosis not present

## 2021-06-04 DIAGNOSIS — R809 Proteinuria, unspecified: Secondary | ICD-10-CM | POA: Diagnosis not present

## 2021-06-04 DIAGNOSIS — E1129 Type 2 diabetes mellitus with other diabetic kidney complication: Secondary | ICD-10-CM | POA: Diagnosis not present

## 2021-06-04 DIAGNOSIS — I152 Hypertension secondary to endocrine disorders: Secondary | ICD-10-CM | POA: Diagnosis not present

## 2021-06-04 DIAGNOSIS — E785 Hyperlipidemia, unspecified: Secondary | ICD-10-CM | POA: Diagnosis not present

## 2021-06-09 ENCOUNTER — Telehealth: Payer: Self-pay

## 2021-06-09 ENCOUNTER — Inpatient Hospital Stay (HOSPITAL_BASED_OUTPATIENT_CLINIC_OR_DEPARTMENT_OTHER): Payer: HMO | Admitting: Obstetrics and Gynecology

## 2021-06-09 ENCOUNTER — Other Ambulatory Visit: Payer: Self-pay

## 2021-06-09 VITALS — BP 160/72 | HR 69 | Temp 98.7°F | Resp 20 | Wt 368.0 lb

## 2021-06-09 DIAGNOSIS — C541 Malignant neoplasm of endometrium: Secondary | ICD-10-CM

## 2021-06-09 NOTE — Patient Instructions (Signed)
Restart norethindrone

## 2021-06-09 NOTE — Telephone Encounter (Signed)
Called and notified Ms. Larocca that Dr. Fransisca Connors has spoken to Dr. Christel Mormon and that he would like to see her in consult. Ms. Arciga is agreeable. Referral placed.

## 2021-06-09 NOTE — Progress Notes (Signed)
Gynecologic Oncology Clinic visit   Patient Care Team: Ellison Hughs Chrissie Noa, MD as PCP - General (Family Medicine) Clent Jacks, RN as Oncology Nurse Navigator   Name of the patient: Kathryn Maddox  [id number]  25-Apr-1951   Date of visit: 06/09/21  Diagnosis- Endometrial Cancer  Chief complaint/ Reason for visit- vaginal bleeding  Heme/Onc history:  Oncology History   No history exists.    Interval history- 70 year old female two weeks ago presented to clinic for complaints of acute vaginal bleeidng, heavy at times. She had increased pelvic pain which she rates as severe at times. Was previously taking norethindrone 5 mg twice a day but felt that her pain worsened and she discontinued medication. She lives alone and is worried about the heavy bleeding.   Oncology history Kathryn Maddox is a 70 y.o. G79 female with multiple medical issues including morbid obesity, poorly controlled diabetes, and sleep apnea who is seen in consultation from Dr. Leonides Schanz for grade 1 endometrial cancer, s/p IUD placement 12/02/20 as she is poor surgical candidate.   LH 9.9 FSH 11.4  TSH 1.654 WNL HbA1c 9.2   EMBx 03/24/2021.  DIAGNOSIS:  A. ENDOMETRIUM; PIPELLE BIOPSY:  - ENDOMETRIOID CARCINOMA, LOW-GRADE (FIGO 1), WITH TREATMENT EFFECT.    Pelvic MRI 04/07/2021 FINDINGS: Lower Urinary Tract: No urinary bladder or urethral abnormality identified.   Bowel: Sigmoid colon diverticulosis, without evidence of diverticulitis.   Vascular/Lymphatic: Sub-cm bilateral iliac lymph nodes are noted, which are stable. No pathologically enlarged lymph nodes identified.   Reproductive:   -- Uterus: Measures 10.2 x 6.3 x 7.5 cm (volume = 250 cm^3). Endometrial thickness measures 13 mm, without significant change compared to previous study. IUD again noted. Indistinct endometrial-myometrial junction is suspicious for superficial myometrial invasion, however there is no evidence of deep (> 50%) myometrial  invasion. Cervix and vagina are unremarkable.   -- Right ovary: Appears normal. No ovarian or adnexal masses identified.   -- Left ovary: Appears normal. No ovarian or adnexal masses identified.   Other: No peritoneal thickening or abnormal free fluid.   Musculoskeletal:  Unremarkable.   IMPRESSION: Stable endometrial thickening measuring 13 mm, with IUD again noted. Probable superficial myometrial invasion, also without significant change. (FIGO stage IA).   No evidence of pelvic metastatic disease.   Sigmoid colon diverticulosis, without evidence of diverticulitis.   She was previously on Megace and had a rash.   Review of systems- Review of Systems  Constitutional:  Negative for chills, fever, malaise/fatigue and weight loss.  HENT:  Negative for hearing loss, nosebleeds, sore throat and tinnitus.   Eyes:  Negative for blurred vision and double vision.  Respiratory:  Negative for cough, hemoptysis, shortness of breath and wheezing.   Cardiovascular:  Negative for chest pain, palpitations and leg swelling.  Gastrointestinal:  Negative for abdominal pain, blood in stool, constipation, diarrhea, melena, nausea and vomiting.  Genitourinary:  Negative for dysuria and urgency.  Musculoskeletal:  Negative for back pain, falls, joint pain and myalgias.  Skin:  Negative for itching and rash.  Neurological:  Negative for dizziness, tingling, sensory change, loss of consciousness, weakness and headaches.  Endo/Heme/Allergies:  Negative for environmental allergies. Does not bruise/bleed easily.  Psychiatric/Behavioral:  Negative for depression. The patient is not nervous/anxious and does not have insomnia.     Current treatment- norethindrone & IUD  Allergies  Allergen Reactions   Megace [Megestrol] Rash   Penicillins Rash and Other (See Comments)    Has patient had a PCN reaction  causing immediate rash, facial/tongue/throat swelling, SOB or lightheadedness with hypotension:  Unknown Has patient had a PCN reaction causing severe rash involving mucus membranes or skin necrosis: Unknown Has patient had a PCN reaction that required hospitalization: Unknown Has patient had a PCN reaction occurring within the last 10 years: No If all of the above answers are "NO", then may proceed with Cephalosporin use.    Ibuprofen Hypertension   Pravastatin Other (See Comments)    Muscle pain, extreme constipation    Past Medical History:  Diagnosis Date   Carpal tunnel syndrome on both sides 03/2018   Cellulitis and abscess of lower extremity 03/2018   bilateral lower extrems, treated with antibx x 10 days   Diabetes mellitus without complication ([place])    Dyspnea 03/2018   with any exercise, due to weight   Eczema    Endometrial cancer ([place]) 08/26/2020   Hyperlipidemia    Hypertension    Morbid obesity with BMI of 70 and over, adult ([place]) 03/2018   Sleep apnea 03/2018   uses a cpap    Past Surgical History:  Procedure Laterality Date   CARPAL TUNNEL RELEASE Left 04/12/2018   Procedure: CARPAL TUNNEL RELEASE;  Surgeon: Hessie Knows, MD;  Location: [facility] ORS;  Service: Orthopedics;  Laterality: Left;   COLONOSCOPY WITH PROPOFOL N/A 12/02/2019   Procedure: COLONOSCOPY WITH PROPOFOL;  Surgeon: Toledo, Benay Pike, MD;  Location: [facility] ENDOSCOPY;  Service: Gastroenterology;  Laterality: N/A;   DILATION AND CURETTAGE OF UTERUS  03/2018   has had 10 d & c's with hysteroscopy   HERNIA REPAIR  6712   umbilical w/ obstructed bowel   LIPOMA EXCISION Right 1997   shoulder   obstructive bowel surgery  01/2010   Social History   Socioeconomic History   Marital status: Divorced    Spouse name: Not on file   Number of children: Not on file   Years of education: Not on file   Highest education level: Not on file  Occupational History   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[place], now retired  Tobacco Use   Smoking status: Never   Smokeless  tobacco: Never  Vaping Use   Vaping Use: Never used  Substance and Sexual Activity   Alcohol use: Yes    Comment: rarely   Drug use: Never   Sexual activity: Not Currently  Other Topics Concern   Not on file  Social History Narrative   Not on file   Social Determinants of Health   Financial Resource Strain: Not on file  Food Insecurity: Not on file  Transportation Needs: Not on file  Physical Activity: Not on file  Stress: Not on file  Social Connections: Not on file  Intimate Partner Violence: Not on file    Family History  Problem Relation Age of Onset   Breast cancer Neg Hx      Current Outpatient Medications:    acetaminophen (TYLENOL) 650 MG CR tablet, Take 1,300 mg by mouth every 8 (eight) hours as needed for pain., Disp: , Rfl:    Easy Touch Lancets 30G/Twist MISC, USE 1 EACH ONE TIME DAILY AS INSTRUCTED BY YOUR DOCTOR, Disp: , Rfl:    FREESTYLE LITE test strip, 1 each daily., Disp: , Rfl:    losartan (COZAAR) 50 MG tablet, Take 50 mg by mouth daily., Disp: , Rfl:    MOUNJARO 2.5 MG/0.5ML Pen, Inject into the skin., Disp: , Rfl:    nystatin (MYCOSTATIN/NYSTOP) powder,  Apply topically 2 (two) times daily., Disp: , Rfl:    rosuvastatin (CRESTOR) 5 MG tablet, Take 5 mg by mouth daily., Disp: , Rfl:    traMADol (ULTRAM) 50 MG tablet, Take 1 tablet (50 mg total) by mouth every 6 (six) hours as needed., Disp: 30 tablet, Rfl: 0  Physical exam:  Vitals:   06/09/21 1317  BP: (!) 160/72  Pulse: 69  Resp: 20  Temp: 98.7 F (37.1 C)  SpO2: 100%  Weight: (!) 368 lb (166.9 kg)   Physical Exam Constitutional:      General: She is not in acute distress.    Appearance: She is well-developed. She is obese. She is not ill-appearing.  HENT:     Mouth/Throat:     Pharynx: No oropharyngeal exudate.  Eyes:     General: No scleral icterus. Cardiovascular:     Rate and Rhythm: Normal rate and regular rhythm.  Pulmonary:     Effort: Pulmonary effort is normal.     Breath  sounds: Normal breath sounds.  Abdominal:     General: There is no distension.     Palpations: Abdomen is soft.     Tenderness: There is no abdominal tenderness. There is no rebound.  Musculoskeletal:        General: No tenderness or deformity.     Right lower leg: No edema.     Left lower leg: No edema.     Comments: wheelchair  Skin:    General: Skin is warm and dry.     Coloration: Skin is not pale.  Neurological:     General: No focal deficit present.     Mental Status: She is alert and oriented to person, place, and time.     Motor: No weakness.  Psychiatric:        Mood and Affect: Mood normal.        Behavior: Behavior normal.    Gyn EGBUS/Vag: normal. Bimanual: IUD found to be expelled in the vagina.  No bleeding or masses.  No flowsheet data found. CBC Latest Ref Rng & Units 05/31/2021  WBC 4.0 - 10.5 K/uL 7.3  Hemoglobin 12.0 - 15.0 g/dL 12.4  Hematocrit 36.0 - 46.0 % 37.7  Platelets 150 - 400 K/uL 195     Assessment:  Jilliann Subramanian is a 70 y.o. female diagnosed with grade 1 endometrial cancer 2/22 on endometrial biopsy. Morbid obesity with BMI 72.  MRI with 13 mm stripe. No or minimal myometrial invasion, less than 50%, suggesting stage 1a disease by imaging.  Dr Leafy Ro placed [place] IUD in clinic 4/22.  Repeat biopsy 03/24/21 showed persistent grade 1 cancer.  Repeat MRI 10/22 stable.    She had heavy bleeding and norethindrone was added and she stopped it due to increased pain.  Now bleeding only light and IUD found to be expelled into vagina today on exam. She is not anemic, but does have pelvic pain.   Medical co-morbidities complicating care: HTN and diabetes non-insulin dependent, morbid obesity (BMI 72) and sleep apnea Plan:       Problem List Items Addressed This Visit            Genitourinary    Endometrial cancer ([place]) - Primary       We again discussed options for management including surgery, radiation, and hormonal therapy. She has significant  medical issues including super morbid obesity, poorly controlled diabetes, and sleep apnea. If surgery was done this would likely be open with attendant risks due  to to BMI 72 and poorly controlled diabetes. Continuing to work on controlling diabetes, now on [place].   I discussed with the patient that since hormonal therapy was not working well, as described above, that radiation may be a reasonable option since she is a poor surgical candidate. She was in agreement with considering this option and I spoke to Dr Christel Mormon at [facility] who thought this was reasonable and could be done with some combination of external beam and brachytherapy.  Will arrange for him to see her soon.  In the meantime she will resume the norethindrone since the IUD fell out.    Pelvic pain- suggested she continue tramadol and NSAIDs.    Mellody Drown, MD

## 2021-06-23 DIAGNOSIS — C549 Malignant neoplasm of corpus uteri, unspecified: Secondary | ICD-10-CM | POA: Diagnosis not present

## 2021-06-23 DIAGNOSIS — C541 Malignant neoplasm of endometrium: Secondary | ICD-10-CM | POA: Diagnosis not present

## 2021-07-02 ENCOUNTER — Telehealth: Payer: Self-pay | Admitting: *Deleted

## 2021-07-02 NOTE — Telephone Encounter (Signed)
Called and spoke with Kathryn Maddox. All questions answered. She will keep her appointment on 1/18 with Dr. Theora Gianotti.

## 2021-07-02 NOTE — Telephone Encounter (Signed)
Patient called stating that she is waiting for the information regarding her biopsy on 1/18 and would like a return call with this information She also asks if Dr Christel Mormon has discussed with Dr Theora Gianotti about this.

## 2021-07-07 ENCOUNTER — Encounter: Payer: Self-pay | Admitting: Obstetrics and Gynecology

## 2021-07-07 ENCOUNTER — Inpatient Hospital Stay: Payer: HMO | Attending: Obstetrics and Gynecology | Admitting: Obstetrics and Gynecology

## 2021-07-07 ENCOUNTER — Inpatient Hospital Stay: Payer: HMO

## 2021-07-07 ENCOUNTER — Other Ambulatory Visit: Payer: Self-pay

## 2021-07-07 VITALS — BP 153/75 | HR 78 | Temp 98.7°F | Resp 18 | Wt 367.0 lb

## 2021-07-07 DIAGNOSIS — E119 Type 2 diabetes mellitus without complications: Secondary | ICD-10-CM | POA: Diagnosis not present

## 2021-07-07 DIAGNOSIS — I10 Essential (primary) hypertension: Secondary | ICD-10-CM | POA: Insufficient documentation

## 2021-07-07 DIAGNOSIS — C541 Malignant neoplasm of endometrium: Secondary | ICD-10-CM | POA: Diagnosis not present

## 2021-07-07 DIAGNOSIS — N950 Postmenopausal bleeding: Secondary | ICD-10-CM

## 2021-07-07 NOTE — Progress Notes (Signed)
Gynecologic Oncology Clinic visit   Patient Care Team: Maddox, Kathryn Noa, MD as PCP - General (Family Medicine) Clent Jacks, RN as Oncology Nurse Navigator   Name of the patient: Kathryn Maddox  [id number]  Aug 10, 1950   Date of visit: 07/07/21  Diagnosis/Chief complaint- Endometrial Cancer  Interval history- 71 year old female two weeks ago presented to clinic for complaints of acute vaginal bleeidng, heavy at times. She had increased pelvic pain which she rates as severe at times. Was previously taking norethindrone 5 mg twice a day but felt that her pain worsened and she discontinued medication after 2 weeks. IUD noted to be expelled on exam 06/09/2021.   She was seen by Radiation Oncology 06/23/2021 for medically inoperable G1 uterine s/p treatment with an IUD placed 11/2020, expulsion of the IUD and persistent disease. He discussed RT for uterine ca as an option and she wanted to review options including re-placement the IUD versus RT. If radiation the plan is for brachy +/- EBRT.  She presents today to discuss options. She was interested in repeat biopsy but I explained that we have a biopsy from 10/22 that demonstrated persistent disease, her IUD expelled, and she was not able to tolerate norethindrone there is not an indication to repeat biopsy at this time given the low probability of disease resolution.   Oncology History Kathryn Maddox is a pleasant G100 female with multiple medical issues including morbid obesity, poorly controlled diabetes, and sleep apnea who is seen in consultation from Dr. Leonides Maddox for grade 1 endometrial cancer, s/p IUD placement 12/02/20 as she is poor surgical candidate.   She has a long history of abnormal vaginal bleeding and menorrhagia and anemia due to chronic blood loss going back to her 30's. She had a syncopal episode from losing so much blood. In [place], had 9 D&Cs with hysteroscopy, last one was in 2010, and bleeding always continued. She was treated with  progesterone injections to stop bleeding. In '92 she had precancerous cells in uterus and was seeing oncologist for this. Was supposed to get a hysterectomy with oncologist, but had a bowel obstruction and surgery was denied until she was recovered. Based on what she described she had a ventral hernia that was repaired and she does not know if bowel was resected or not. She had not been to gynecologist since 2011 until she was seen by Dr. Leonides Maddox 07/30/2020. She has had postmenopausal bleeding x 7-8 years.   Her work up has included pelvic US, EMBx, and labs.    Pelvic US 08/14/2020 at [place]: Uterus: Measurements: 7.7 x 5.3 x 5.8 cm = volume: 123 mL. No fibroids or other mass visualized. Endometrium: Thickness: 10 mm.  No focal abnormality visualized. Right ovary: Nonvisualized. Left ovary: Nonvisualized. Other findings: No abnormal free fluid.   08/14/2020 EMBX showed a WELL DIFFERENTIATED ENDOMETRIAL ADENOCARCINOMA   She was previously on Megace and had a rash.   Opted for IUD for conservative management.   03/24/2021 EMBX - ENDOMETRIOID CARCINOMA, LOW-GRADE (FIGO 1), WITH TREATMENT EFFECT.    Pelvic MRI 04/07/2021 FINDINGS:  -- Uterus: Measures 10.2 x 6.3 x 7.5 cm (volume = 250 cm^3). Endometrial thickness measures 13 mm, without significant change compared to previous study. IUD again noted. Indistinct endometrial-myometrial junction is suspicious for superficial myometrial invasion, however there is no evidence of deep (> 50%) myometrial invasion. Cervix and vagina are unremarkable. -- Bilateral ovaries: Appears normal. No ovarian or adnexal masses IMPRESSION: Stable endometrial thickening measuring 13 mm, with  IUD again noted. Probable superficial myometrial invasion, also without significant change. (FIGO stage IA). No evidence of pelvic metastatic disease. Sigmoid colon diverticulosis, without evidence of diverticulitis.    06/09/2021 Exam IUD noted to be expelled.         Past Medical History:  Diagnosis Date   Carpal tunnel syndrome on both sides 03/2018   Cellulitis and abscess of lower extremity 03/2018   bilateral lower extrems, treated with antibx x 10 days   Diabetes mellitus without complication ([place])    Dyspnea 03/2018   with any exercise, due to weight   Eczema    Endometrial cancer ([place]) 08/26/2020   Hyperlipidemia    Hypertension    Morbid obesity with BMI of 70 and over, adult ([place]) 03/2018   Sleep apnea 03/2018   uses a cpap    Past Surgical History:  Procedure Laterality Date   CARPAL TUNNEL RELEASE Left 04/12/2018   Procedure: CARPAL TUNNEL RELEASE;  Surgeon: Kathryn Knows, MD;  Location: [facility] ORS;  Service: Orthopedics;  Laterality: Left;   COLONOSCOPY WITH PROPOFOL N/A 12/02/2019   Procedure: COLONOSCOPY WITH PROPOFOL;  Surgeon: Toledo, Benay Pike, MD;  Location: [facility] ENDOSCOPY;  Service: Gastroenterology;  Laterality: N/A;   DILATION AND CURETTAGE OF UTERUS  03/2018   has had 10 d & c's with hysteroscopy   HERNIA REPAIR  4098   umbilical w/ obstructed bowel   LIPOMA EXCISION Right 1997   shoulder   obstructive bowel surgery  01/2010   Social History   Socioeconomic History   Marital status: Divorced    Spouse name: Not on file   Number of children: Not on file   Years of education: Not on file   Highest education level: Not on file  Occupational History   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[place], now retired  Tobacco Use   Smoking status: Never   Smokeless tobacco: Never  Vaping Use   Vaping Use: Never used  Substance and Sexual Activity   Alcohol use: Yes    Comment: rarely   Drug use: Never   Sexual activity: Not Currently  Other Topics Concern   Not on file  Social History Narrative   Not on file   Social Determinants of Health   Financial Resource Strain: Not on file  Food Insecurity: Not on file  Transportation Needs: Not on file  Physical Activity: Not on  file  Stress: Not on file  Social Connections: Not on file  Intimate Partner Violence: Not on file    Family History  Problem Relation Age of Onset   Breast cancer Neg Hx    Allergies  Allergen Reactions   Megace [Megestrol] Rash   Penicillins Rash and Other (See Comments)    Has patient had a PCN reaction causing immediate rash, facial/tongue/throat swelling, SOB or lightheadedness with hypotension: Unknown Has patient had a PCN reaction causing severe rash involving mucus membranes or skin necrosis: Unknown Has patient had a PCN reaction that required hospitalization: Unknown Has patient had a PCN reaction occurring within the last 10 years: No If all of the above answers are "NO", then may proceed with Cephalosporin use.    Ibuprofen Hypertension   Pravastatin Other (See Comments)    Muscle pain, extreme constipation      Current Outpatient Medications:    acetaminophen (TYLENOL) 650 MG CR tablet, Take 1,300 mg by mouth every 8 (eight) hours as needed for pain., Disp: , Rfl:  Easy Touch Lancets 30G/Twist MISC, USE 1 EACH ONE TIME DAILY AS INSTRUCTED BY YOUR DOCTOR, Disp: , Rfl:    FREESTYLE LITE test strip, 1 each daily., Disp: , Rfl:    losartan (COZAAR) 50 MG tablet, Take 50 mg by mouth daily., Disp: , Rfl:    MOUNJARO 2.5 MG/0.5ML Pen, Inject 2.5 mg into the skin., Disp: , Rfl:    nystatin (MYCOSTATIN/NYSTOP) powder, Apply topically 2 (two) times daily., Disp: , Rfl:    rosuvastatin (CRESTOR) 5 MG tablet, Take 5 mg by mouth daily., Disp: , Rfl:    traMADol (ULTRAM) 50 MG tablet, Take 1 tablet (50 mg total) by mouth every 6 (six) hours as needed. (Patient not taking: Reported on 07/07/2021), Disp: 30 tablet, Rfl: 0   Review of Systems:  General: decreased appetite o/w no complaints  HEENT: no complaints  Lungs: no complaints  Cardiac: no complaints  GI: no complaints  GU: vaginal bleeding/pelvic pain improved after stopping norethindrone o/w no complaints   Musculoskeletal: no complaints  Extremities: no complaints  Skin: no complaints  Neuro: numbness o/w no complaints  Endocrine: no complaints  Psych: no complaints      Physical exam:  Vitals:   07/07/21 1051  BP: (!) 153/75  Pulse: 78  Resp: 18  Temp: 98.7 F (37.1 C)  SpO2: 98%  Weight: (!) 367 lb (166.5 kg)  GENERAL: Patient is a well appearing female in no acute distress HEENT:  PERRL, neck supple with midline trachea. Thyroid without masses.  ABDOMEN:  Obese soft, nontender and nondistended MSK:  she arrived to and left clinic in a wheel chair NEURO:  Nonfocal. Well oriented.  Appropriate affect.  Pelvic: chaperoned by CMAs EGBUS: no lesions Cervix: no lesions, nontender, mobile Vagina: no lesions, no discharge or bleeding Uterus: nontender. Unable to determine size due to habitus Adnexa: no palpable masses but limited by exam  For speculum exams need to use large extra-long Graves speculum. Two attendants need to flex thighs onto abdomen in order to visualize the cervix.      No flowsheet data found. CBC Latest Ref Rng & Units 05/31/2021  WBC 4.0 - 10.5 K/uL 7.3  Hemoglobin 12.0 - 15.0 g/dL 12.4  Hematocrit 36.0 - 46.0 % 37.7  Platelets 150 - 400 K/uL 195     Assessment:  Kathryn Maddox is a 71 y.o. female diagnosed with grade 1 endometrial cancer 2/22 on endometrial biopsy. Morbid obesity with BMI 72.  MRI with 13 mm stripe. No or minimal myometrial invasion, less than 50%, suggesting stage 1a disease by imaging.  Dr Leafy Ro placed [place] IUD in clinic 4/22.  Repeat biopsy 03/24/21 showed persistent grade 1 cancer.  Repeat MRI 10/22 stable.  Unable to tolerate norethindrone and IUD expelled. Challenging pelvic exam.     Medical co-morbidities complicating care: HTN and diabetes non-insulin dependent, morbid obesity (BMI 72) and sleep apnea Plan:       Problem List Items Addressed This Visit            Genitourinary    Endometrial cancer ([place]) - Primary        We again discussed options for management including radiation, and hormonal therapy. She is not an optimal surgical candidate given morbid obesity, poorly controlled diabetes, and sleep apnea with high risk for complications. I have recommended radiation for definitive management. There is a higher probability for cure with radiation compared hormonal therapy and less risk compared to surgery.   She is interested in having radiation  closer to home given the distance to [facility]. She asked about treatment at [facility] and we will check with Dr. Baruch Gouty. If radiation is not an option at [facility] then she will follow up with Dr Christel Mormon at [facility].   Return to [place] 2-3 months after radiation completed. Mariea Clonts, RN will follow up with Kathryn Maddox and ensure coordination of care.   I personally interviewed and examined the patient and provided all counseling. Patient/family questions were answered.   A total of at least 35 minutes were spent with the patient/family today; >50% was spent in education, counseling and coordination of care for endometrial cancer.  Santiago Glad, MD

## 2021-07-07 NOTE — Patient Instructions (Signed)
External Beam Radiation Therapy External beam radiation therapy is a type of radiation treatment. This type of radiation therapy can deliver radiation to a fairly large area. This is the most common type of radiation therapy for cancer. It may be done for several purposes: Treating cancer. This is done by: Destroying cancer cells. Radiation delivered during the treatment damages cancer cells. It may also damage normal cells, but normal cells have the DNA to repair themselves and cancer cells do not. Helping with symptoms of a person's cancer. Stopping the growth of any remaining cancer cells after surgery. Preventing cancer cells from growing in areas that do not have cancer (prophylactic radiation therapy). Treating or shrinking a tumor that is not cancerous (is benign). Reducing pain (palliative therapy). The amount of radiation a person receives and the length of therapy depend on the person's medical condition. The person should not feel the radiation being delivered or any pain during the therapy. Let your health care provider know about: Any allergies you have. All medicines you are taking, including vitamins, herbs, eye drops, creams, and over-the-counter medicines. Any blood disorders you have. Any surgeries you have had. Any medical conditions you have. Whether you are pregnant or may be pregnant. What are the risks? Generally, this is a safe procedure. However, radiation therapy can place a person at a higher risk for a second cancer later in life. Most people will have side effects from the therapy. Side effects depend on the amount of radiation and the part of the body that was exposed to radiation. The most common side effects include: Skin changes. Hair loss. Tiredness (fatigue). Nausea and vomiting. What happens before the procedure? Medicines Ask your health care provider about: Changing or stopping your regular medicines. Taking over-the-counter medicines, vitamins,  herbs, and supplements. General instructions You will have a planning session (simulation). During the session: Your health care provider will plan exactly where the radiation will be delivered. This area is called the treatment field. You will be positioned for your therapy. The goal is to have a position that can be reproduced for each therapy session. Temporary marks may be drawn on your body. Permanent marks may also be drawn on your body in order for you to be positioned the same way for each therapy session. A tool that holds a body part in place (immobilization device) may be used to keep the area of treatment in the correct position. Follow instructions from your health care provider about eating or drinking restrictions. You may want to plan to have someone take you home from the hospital or clinic. What happens during the procedure?  You will either lie on a table or sit in a chair in the position determined for your therapy. You may have a heavy shield placed on you to protect tissues and organs that are not being treated. The radiation machine (linear accelerator) will move around you to deliver the radiation in exact doses from different angles. The machine will not touch you. If you had a heavy shield placed on you, it will be removed when the machine is finished delivering radiation. The procedure may vary among health care providers and hospitals. What happens after the procedure? You may return to your normal routine--including diet, activities, and medicines--as told by your health care provider. You may feel very tired. Summary External beam radiation therapy is the most common type of radiation treatment for cancer. The amount of radiation a person will receive and the length of therapy depend on  the person's medical condition. Most people have side effects from the therapy. Side effects depend on the amount of radiation and the part of the body that was exposed to  radiation. This information is not intended to replace advice given to you by your health care provider. Make sure you discuss any questions you have with your health care provider. Document Revised: 12/16/2020 Document Reviewed: 02/18/2019 Elsevier Patient Education  [place].     Internal Radiation Therapy, Care After This sheet gives you information about how to care for yourself after your procedure. Your health care provider may also give you more specific instructions. If you have problems or questions, contact your health care provider. What can I expect after the procedure? After the procedure, it is common to have: Soreness, bruising, or swelling. Nausea or vomiting. Diarrhea. Fatigue. Other side effects may occur, depending on which part of your body was exposed to radiation and how much radiation was used. Most side effects are usually temporary and get better over time. It can take up to 3-4 weeks for you to regain your energy or for side effects to get better. Follow these instructions at home: Activity Most people can return to normal activities a few days or weeks after the procedure. Ask your health care provider what activities are safe for you. Rest as told by your health care provider. Driving  Do not drive until your health care provider approves. If you were given a medicine to help you relax (sedative) during your procedure, do not drive for 24 hours. Do not drive or use heavy machinery while taking prescription pain medicine. Incision and delivery site care  If you have an incision, follow instructions from your health care provider about how to take care of your incision. Make sure you: Change your bandage (dressing) as told by your health care provider. Wash your hands with soap and water before you change your dressing. If soap and water are not available, use hand sanitizer. Leave stitches (sutures), skin glue, or adhesive strips in place. These  skin closures may need to stay in place for 2 weeks or longer. If adhesive strip edges start to loosen and curl up, you may trim the loose edges. Do not remove adhesive strips completely unless your health care provider tells you to do that. If you have an incision, do not take baths, swim, or use a hot tub until your health care provider approves. Ask your health care provider if you may take showers. You may only be allowed to take sponge baths. Check the area where your implant was placed (delivery site) every day for signs of infection. If you have an incision, you should also check that area every day. Check for: Redness, swelling, or pain. Fluid or blood. Warmth. Pus or a bad smell. Medicines Take over-the-counter and prescription medicines only as told by your health care provider. If you were prescribed an antibiotic medicine, take it as told by your health care provider. Lifestyle Do not use any products that contain nicotine or tobacco, such as cigarettes and e-cigarettes. These can delay healing. If you need help quitting, ask your health care provider. Try to eat regular, healthy meals. Some of your treatments might affect your appetite. General instructions If you are taking prescription pain medicine, take actions to prevent or treat constipation. Your health care provider may recommend that you: Drink enough fluid to keep your urine pale yellow. Eat foods that are high in fiber, such as fresh fruits  and vegetables, whole grains, and beans. Limit foods that are high in fat and processed sugars, such as fried or sweet foods. Take an over-the-counter or prescription medicine for constipation. Certain types of implants may set off metal or radiation detectors at airport security checkpoints. Carry a note from your health care provider to explain that you have an implant. Ask your health care provider if you should avoid contact with others (especially children and pregnant women) for a  period of time after treatment. Keep all follow-up visits as told by your health care provider. This is important. You will need follow-up to determine how well the radiation therapy worked. Contact a health care provider if you have: A fever. Any of the following around your delivery site or incision: Redness, swelling, or pain. Fluid or blood. Warmth. Pus or a bad smell. Nausea or fatigue that does not go away after several days. Get help right away if you: Have difficulty breathing. Feel like you are going to faint. Have severe pain. Have severe vomiting or diarrhea. Summary Most people can return to normal activities a few days or weeks after treatment. Ask your health care provider what activities are safe for you. Ask your health care provider if you should avoid contact with children and pregnant women for a period of time after your treatment. Check your delivery site every day for signs of infection. If you have an incision, check that area every day too. Keep all follow-up visits as told by your health care provider. This is important. Make sure you know what symptoms should cause you to contact a health care provider or get help right away. This information is not intended to replace advice given to you by your health care provider. Make sure you discuss any questions you have with your health care provider. Document Revised: 12/16/2020 Document Reviewed: 12/11/2019 Elsevier Patient Education  [place].

## 2021-07-12 ENCOUNTER — Telehealth: Payer: Self-pay | Admitting: *Deleted

## 2021-07-12 ENCOUNTER — Telehealth: Payer: Self-pay

## 2021-07-12 DIAGNOSIS — C541 Malignant neoplasm of endometrium: Secondary | ICD-10-CM

## 2021-07-12 NOTE — Telephone Encounter (Signed)
Spoke with Dr. Baruch Gouty regarding radiation locally. He is agreeable to see her in consult. Dr. Boneta Lucks notes reviewed. Called and notified Ms. Larocca and referral sent.

## 2021-07-12 NOTE — Telephone Encounter (Signed)
Called patient to inform her of consult date and time. Patient verbalized understanding of appointment.

## 2021-07-21 ENCOUNTER — Other Ambulatory Visit: Payer: Self-pay

## 2021-07-21 ENCOUNTER — Ambulatory Visit
Admission: RE | Admit: 2021-07-21 | Discharge: 2021-07-21 | Disposition: A | Discharge status: Home / Self Care | Payer: HMO | Source: Ambulatory Visit | Attending: Radiation Oncology | Admitting: Radiation Oncology

## 2021-07-21 ENCOUNTER — Encounter: Payer: Self-pay | Admitting: Radiation Oncology

## 2021-07-21 VITALS — BP 152/71 | HR 85 | Resp 18 | Wt 359.0 lb

## 2021-07-21 DIAGNOSIS — Z79899 Other long term (current) drug therapy: Secondary | ICD-10-CM | POA: Insufficient documentation

## 2021-07-21 DIAGNOSIS — N950 Postmenopausal bleeding: Secondary | ICD-10-CM | POA: Diagnosis not present

## 2021-07-21 DIAGNOSIS — E119 Type 2 diabetes mellitus without complications: Secondary | ICD-10-CM | POA: Diagnosis not present

## 2021-07-21 DIAGNOSIS — G4730 Sleep apnea, unspecified: Secondary | ICD-10-CM | POA: Insufficient documentation

## 2021-07-21 DIAGNOSIS — C541 Malignant neoplasm of endometrium: Secondary | ICD-10-CM | POA: Insufficient documentation

## 2021-07-21 DIAGNOSIS — Z791 Long term (current) use of non-steroidal anti-inflammatories (NSAID): Secondary | ICD-10-CM | POA: Insufficient documentation

## 2021-07-21 DIAGNOSIS — I10 Essential (primary) hypertension: Secondary | ICD-10-CM | POA: Insufficient documentation

## 2021-07-21 DIAGNOSIS — E785 Hyperlipidemia, unspecified: Secondary | ICD-10-CM | POA: Diagnosis not present

## 2021-07-21 NOTE — Consult Note (Signed)
NEW PATIENT EVALUATION  Name: Kathryn Maddox  MRN: [id number]  Date:   07/21/2021     DOB: [birth year]   This 71 y.o. 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  REFERRING PHYSICIAN: Sofie Hartigan, MD  CHIEF COMPLAINT:  Chief Complaint  Patient presents with   Cancer    Initial consultation    DIAGNOSIS: The encounter diagnosis was Endometrial cancer ([place]).   PREVIOUS INVESTIGATIONS:  MRI scans reviewed Pathology report reviewed Clinical notes reviewed  HPI: [name] with a proximally 1 year history who presented with increasing pelvic pain and postmenopausal bleeding.  She had an IUD placed in June 2022 with expulsion of the IUD and persistent disease.  Her initial biopsy back in February 22 revealed grade 1 adenocarcinoma.  MRI showed 13 mm endometrial thickness no extrauterine involvement.  In October she had repeat biopsy showing again grade 1 adenocarcinoma and MRI confirmed a 10 x 6 x 7 cm uterus with 13 mm of endometrial thickness with possible superficial myometrial invasion with no deep myometrial invasion.  Based on multiple chronic comorbidities including morbid obesity she is deemed medically inoperable.  She was seen by Dr. Christel Mormon at [facility] who offered external beam radiation therapy plus brachytherapy boost.  She is seen today for consideration of external beam radiation therapy closer to home.  She continues to have slight bleeding and persistent pelvic pain.  PLANNED TREATMENT REGIMEN: External beam radiation therapy followed by brachytherapy  PAST MEDICAL HISTORY:  has a past medical history of Carpal tunnel syndrome on both sides (03/2018), Cellulitis and abscess of lower extremity (03/2018), Diabetes mellitus without complication ([place]), Dyspnea (03/2018),  Eczema, Endometrial cancer ([place]) (08/26/2020), Hyperlipidemia, Hypertension, Morbid obesity with BMI of 70 and over, adult ([place]) (03/2018), and Sleep apnea (03/2018).    PAST SURGICAL HISTORY:  Past Surgical History:  Procedure Laterality Date   CARPAL TUNNEL RELEASE Left 04/12/2018   Procedure: CARPAL TUNNEL RELEASE;  Surgeon: Hessie Knows, MD;  Location: [facility] ORS;  Service: Orthopedics;  Laterality: Left;   COLONOSCOPY WITH PROPOFOL N/A 12/02/2019   Procedure: COLONOSCOPY WITH PROPOFOL;  Surgeon: Toledo, Benay Pike, MD;  Location: [facility] ENDOSCOPY;  Service: Gastroenterology;  Laterality: N/A;   DILATION AND CURETTAGE OF UTERUS  03/2018   has had 10 d & c's with hysteroscopy   HERNIA REPAIR  7989   umbilical w/ obstructed bowel   LIPOMA EXCISION Right 1997   shoulder   obstructive bowel surgery  01/2010    FAMILY HISTORY: family history is not on file.  SOCIAL HISTORY:  reports that she has never smoked. She has never used smokeless tobacco. She reports current alcohol use. She reports that she does not use drugs.  ALLERGIES: Megace [megestrol], Penicillins, Ibuprofen, and Pravastatin  MEDICATIONS:  Current Outpatient Medications  Medication Sig Dispense Refill   acetaminophen (TYLENOL) 650 MG CR tablet Take 1,300 mg by mouth every 8 (eight) hours as needed for pain.     Easy Touch Lancets 30G/Twist MISC USE 1 EACH ONE TIME DAILY AS INSTRUCTED BY YOUR DOCTOR     FREESTYLE LITE test strip 1 each daily.     ibuprofen (ADVIL) 400 MG tablet Take 400 mg by mouth every 6 (six) hours as needed.     losartan (COZAAR) 50 MG tablet Take 50 mg by mouth daily.  MOUNJARO 2.5 MG/0.5ML Pen Inject 2.5 mg into the skin.     rosuvastatin (CRESTOR) 5 MG tablet Take 5 mg by mouth daily.     No current facility-administered medications for this encounter.    ECOG PERFORMANCE STATUS:  2 - Symptomatic, <50% confined to bed  REVIEW OF SYSTEMS: Patient denies any weight loss, fatigue, weakness,  fever, chills or night sweats. Patient denies any loss of vision, blurred vision. Patient denies any ringing  of the ears or hearing loss. No irregular heartbeat. Patient denies heart murmur or history of fainting. Patient denies any chest pain or pain radiating to her upper extremities. Patient denies any shortness of breath, difficulty breathing at night, cough or hemoptysis. Patient denies any swelling in the lower legs. Patient denies any nausea vomiting, vomiting of blood, or coffee ground material in the vomitus. Patient denies any stomach pain. Patient states has had normal bowel movements no significant constipation or diarrhea. Patient denies any dysuria, hematuria or significant nocturia. Patient denies any problems walking, swelling in the joints or loss of balance. Patient denies any skin changes, loss of hair or loss of weight. Patient denies any excessive worrying or anxiety or significant depression. Patient denies any problems with insomnia. Patient denies excessive thirst, polyuria, polydipsia. Patient denies any swollen glands, patient denies easy bruising or easy bleeding. Patient denies any recent infections, allergies or URI. Patient "s visual fields have not changed significantly in recent time.   PHYSICAL EXAM: BP (!) 152/71 (BP Location: Left Arm, Patient Position: Sitting)    Pulse 85    Resp 18    Wt (!) 359 lb (162.8 kg) Comment: Per patient   BMI 67.83 kg/m  Obese wheelchair-bound female in NAD.  Well-developed well-nourished patient in NAD. HEENT reveals PERLA, EOMI, discs not visualized.  Oral cavity is clear. No oral mucosal lesions are identified. Neck is clear without evidence of cervical or supraclavicular adenopathy. Lungs are clear to A&P. Cardiac examination is essentially unremarkable with regular rate and rhythm without murmur rub or thrill. Abdomen is benign with no organomegaly or masses noted. Motor sensory and DTR levels are equal and symmetric in the upper and lower  extremities. Cranial nerves II through XII are grossly intact. Proprioception is intact. No peripheral adenopathy or edema is identified. No motor or sensory levels are noted. Crude visual fields are within normal range.  LABORATORY DATA: Pathology report reviewed    RADIOLOGY RESULTS: MRI scan reviewed compatible with above-stated findings   IMPRESSION: FIGO grade 1 early stage endometrial carcinoma in 71 year old patient in operable.  PLAN: This time like to go ahead with external beam radiation therapy to her uterus.  Would plan on delivering 45 Gray in 25 fractions.  This will be followed by brachytherapy performed at [facility] by Dr. Christel Mormon.  Risks and benefits of treatment occluding increased lower urinary tract symptoms possible diarrhea fatigue alteration blood counts and skin reaction all were discussed with the patient.  I have personally set up and ordered CT simulation for early next week.  Patient comprehends my recommendations well.  I would like to take this opportunity to thank you for allowing me to participate in the care of your patient.Noreene Filbert, MD

## 2021-07-26 ENCOUNTER — Ambulatory Visit
Admission: RE | Admit: 2021-07-26 | Discharge: 2021-07-26 | Disposition: A | Discharge status: Home / Self Care | Payer: HMO | Source: Ambulatory Visit | Attending: Radiation Oncology | Admitting: Radiation Oncology

## 2021-07-26 DIAGNOSIS — Z510 Encounter for antineoplastic radiation therapy: Secondary | ICD-10-CM | POA: Diagnosis not present

## 2021-07-26 DIAGNOSIS — C541 Malignant neoplasm of endometrium: Secondary | ICD-10-CM | POA: Insufficient documentation

## 2021-07-28 ENCOUNTER — Other Ambulatory Visit: Payer: Self-pay | Admitting: *Deleted

## 2021-07-28 DIAGNOSIS — C541 Malignant neoplasm of endometrium: Secondary | ICD-10-CM

## 2021-07-28 DIAGNOSIS — Z510 Encounter for antineoplastic radiation therapy: Secondary | ICD-10-CM | POA: Diagnosis not present

## 2021-08-02 ENCOUNTER — Ambulatory Visit
Admission: RE | Admit: 2021-08-02 | Discharge: 2021-08-02 | Disposition: A | Discharge status: Home / Self Care | Payer: HMO | Source: Ambulatory Visit | Attending: Radiation Oncology | Admitting: Radiation Oncology

## 2021-08-02 DIAGNOSIS — Z510 Encounter for antineoplastic radiation therapy: Secondary | ICD-10-CM | POA: Diagnosis not present

## 2021-08-02 DIAGNOSIS — C541 Malignant neoplasm of endometrium: Secondary | ICD-10-CM | POA: Diagnosis not present

## 2021-08-03 ENCOUNTER — Ambulatory Visit
Admission: RE | Admit: 2021-08-03 | Discharge: 2021-08-03 | Disposition: A | Discharge status: Home / Self Care | Payer: HMO | Source: Ambulatory Visit | Attending: Radiation Oncology | Admitting: Radiation Oncology

## 2021-08-03 DIAGNOSIS — Z510 Encounter for antineoplastic radiation therapy: Secondary | ICD-10-CM | POA: Diagnosis not present

## 2021-08-03 DIAGNOSIS — C541 Malignant neoplasm of endometrium: Secondary | ICD-10-CM | POA: Diagnosis not present

## 2021-08-04 ENCOUNTER — Ambulatory Visit
Admission: RE | Admit: 2021-08-04 | Discharge: 2021-08-04 | Disposition: A | Discharge status: Home / Self Care | Payer: HMO | Source: Ambulatory Visit | Attending: Radiation Oncology | Admitting: Radiation Oncology

## 2021-08-04 DIAGNOSIS — Z510 Encounter for antineoplastic radiation therapy: Secondary | ICD-10-CM | POA: Diagnosis not present

## 2021-08-04 DIAGNOSIS — C541 Malignant neoplasm of endometrium: Secondary | ICD-10-CM | POA: Diagnosis not present

## 2021-08-05 ENCOUNTER — Ambulatory Visit
Admission: RE | Admit: 2021-08-05 | Discharge: 2021-08-05 | Disposition: A | Discharge status: Home / Self Care | Payer: HMO | Source: Ambulatory Visit | Attending: Radiation Oncology | Admitting: Radiation Oncology

## 2021-08-05 DIAGNOSIS — C541 Malignant neoplasm of endometrium: Secondary | ICD-10-CM | POA: Diagnosis not present

## 2021-08-05 DIAGNOSIS — Z510 Encounter for antineoplastic radiation therapy: Secondary | ICD-10-CM | POA: Diagnosis not present

## 2021-08-06 ENCOUNTER — Ambulatory Visit
Admission: RE | Admit: 2021-08-06 | Discharge: 2021-08-06 | Disposition: A | Discharge status: Home / Self Care | Payer: HMO | Source: Ambulatory Visit | Attending: Radiation Oncology | Admitting: Radiation Oncology

## 2021-08-06 DIAGNOSIS — C541 Malignant neoplasm of endometrium: Secondary | ICD-10-CM | POA: Diagnosis not present

## 2021-08-06 DIAGNOSIS — Z510 Encounter for antineoplastic radiation therapy: Secondary | ICD-10-CM | POA: Diagnosis not present

## 2021-08-09 ENCOUNTER — Ambulatory Visit
Admission: RE | Admit: 2021-08-09 | Discharge: 2021-08-09 | Disposition: A | Discharge status: Home / Self Care | Payer: HMO | Source: Ambulatory Visit | Attending: Radiation Oncology | Admitting: Radiation Oncology

## 2021-08-09 DIAGNOSIS — Z510 Encounter for antineoplastic radiation therapy: Secondary | ICD-10-CM | POA: Diagnosis not present

## 2021-08-09 DIAGNOSIS — C541 Malignant neoplasm of endometrium: Secondary | ICD-10-CM | POA: Diagnosis not present

## 2021-08-10 ENCOUNTER — Ambulatory Visit
Admission: RE | Admit: 2021-08-10 | Discharge: 2021-08-10 | Disposition: A | Discharge status: Home / Self Care | Payer: HMO | Source: Ambulatory Visit | Attending: Radiation Oncology | Admitting: Radiation Oncology

## 2021-08-10 ENCOUNTER — Telehealth: Payer: Self-pay | Admitting: *Deleted

## 2021-08-10 DIAGNOSIS — C541 Malignant neoplasm of endometrium: Secondary | ICD-10-CM | POA: Diagnosis not present

## 2021-08-10 DIAGNOSIS — Z510 Encounter for antineoplastic radiation therapy: Secondary | ICD-10-CM | POA: Diagnosis not present

## 2021-08-10 DIAGNOSIS — E782 Mixed hyperlipidemia: Secondary | ICD-10-CM | POA: Diagnosis not present

## 2021-08-10 NOTE — Telephone Encounter (Signed)
Called Dr. Boneta Lucks office to get appointment to be set up for brachytherapy after radiation.   She will be seen by Dr. Christel Mormon on 08/18/21 at 3:30.  Patient made aware of appointment.

## 2021-08-11 ENCOUNTER — Ambulatory Visit
Admission: RE | Admit: 2021-08-11 | Discharge: 2021-08-11 | Disposition: A | Discharge status: Home / Self Care | Payer: HMO | Source: Ambulatory Visit | Attending: Radiation Oncology | Admitting: Radiation Oncology

## 2021-08-11 DIAGNOSIS — C541 Malignant neoplasm of endometrium: Secondary | ICD-10-CM | POA: Diagnosis not present

## 2021-08-11 DIAGNOSIS — Z510 Encounter for antineoplastic radiation therapy: Secondary | ICD-10-CM | POA: Diagnosis not present

## 2021-08-12 ENCOUNTER — Other Ambulatory Visit: Payer: Self-pay

## 2021-08-12 ENCOUNTER — Inpatient Hospital Stay: Payer: HMO

## 2021-08-12 ENCOUNTER — Ambulatory Visit
Admission: RE | Admit: 2021-08-12 | Discharge: 2021-08-12 | Disposition: A | Discharge status: Home / Self Care | Payer: HMO | Source: Ambulatory Visit | Attending: Radiation Oncology | Admitting: Radiation Oncology

## 2021-08-12 DIAGNOSIS — Z510 Encounter for antineoplastic radiation therapy: Secondary | ICD-10-CM | POA: Diagnosis not present

## 2021-08-12 DIAGNOSIS — C541 Malignant neoplasm of endometrium: Secondary | ICD-10-CM | POA: Diagnosis not present

## 2021-08-12 LAB — CBC
HCT: 41.3 % (ref 36.0–46.0)
Hemoglobin: 13.3 g/dL (ref 12.0–15.0)
MCH: 27.9 pg (ref 26.0–34.0)
MCHC: 32.2 g/dL (ref 30.0–36.0)
MCV: 86.6 fL (ref 80.0–100.0)
Platelets: 219 10*3/uL (ref 150–400)
RBC: 4.77 MIL/uL (ref 3.87–5.11)
RDW: 13.2 % (ref 11.5–15.5)
WBC: 4.3 10*3/uL (ref 4.0–10.5)
nRBC: 0 % (ref 0.0–0.2)

## 2021-08-13 ENCOUNTER — Ambulatory Visit
Admission: RE | Admit: 2021-08-13 | Discharge: 2021-08-13 | Disposition: A | Discharge status: Home / Self Care | Payer: HMO | Source: Ambulatory Visit | Attending: Radiation Oncology | Admitting: Radiation Oncology

## 2021-08-13 DIAGNOSIS — Z510 Encounter for antineoplastic radiation therapy: Secondary | ICD-10-CM | POA: Diagnosis not present

## 2021-08-13 DIAGNOSIS — C541 Malignant neoplasm of endometrium: Secondary | ICD-10-CM | POA: Diagnosis not present

## 2021-08-16 ENCOUNTER — Ambulatory Visit
Admission: RE | Admit: 2021-08-16 | Discharge: 2021-08-16 | Disposition: A | Discharge status: Home / Self Care | Payer: HMO | Source: Ambulatory Visit | Attending: Radiation Oncology | Admitting: Radiation Oncology

## 2021-08-16 DIAGNOSIS — Z510 Encounter for antineoplastic radiation therapy: Secondary | ICD-10-CM | POA: Diagnosis not present

## 2021-08-16 DIAGNOSIS — C541 Malignant neoplasm of endometrium: Secondary | ICD-10-CM | POA: Diagnosis not present

## 2021-08-17 ENCOUNTER — Ambulatory Visit
Admission: RE | Admit: 2021-08-17 | Discharge: 2021-08-17 | Disposition: A | Discharge status: Home / Self Care | Payer: HMO | Source: Ambulatory Visit | Attending: Radiation Oncology | Admitting: Radiation Oncology

## 2021-08-17 DIAGNOSIS — Z6841 Body Mass Index (BMI) 40.0 and over, adult: Secondary | ICD-10-CM | POA: Diagnosis not present

## 2021-08-17 DIAGNOSIS — Z510 Encounter for antineoplastic radiation therapy: Secondary | ICD-10-CM | POA: Diagnosis not present

## 2021-08-17 DIAGNOSIS — C541 Malignant neoplasm of endometrium: Secondary | ICD-10-CM | POA: Diagnosis not present

## 2021-08-17 DIAGNOSIS — I10 Essential (primary) hypertension: Secondary | ICD-10-CM | POA: Diagnosis not present

## 2021-08-17 DIAGNOSIS — E782 Mixed hyperlipidemia: Secondary | ICD-10-CM | POA: Diagnosis not present

## 2021-08-17 DIAGNOSIS — E1165 Type 2 diabetes mellitus with hyperglycemia: Secondary | ICD-10-CM | POA: Diagnosis not present

## 2021-08-17 DIAGNOSIS — G4733 Obstructive sleep apnea (adult) (pediatric): Secondary | ICD-10-CM | POA: Diagnosis not present

## 2021-08-18 ENCOUNTER — Ambulatory Visit
Admission: RE | Admit: 2021-08-18 | Discharge: 2021-08-18 | Disposition: A | Discharge status: Home / Self Care | Payer: HMO | Source: Ambulatory Visit | Attending: Radiation Oncology | Admitting: Radiation Oncology

## 2021-08-18 DIAGNOSIS — C541 Malignant neoplasm of endometrium: Secondary | ICD-10-CM | POA: Insufficient documentation

## 2021-08-18 DIAGNOSIS — Z510 Encounter for antineoplastic radiation therapy: Secondary | ICD-10-CM | POA: Diagnosis not present

## 2021-08-19 ENCOUNTER — Inpatient Hospital Stay: Payer: HMO | Attending: Obstetrics and Gynecology

## 2021-08-19 ENCOUNTER — Ambulatory Visit
Admission: RE | Admit: 2021-08-19 | Discharge: 2021-08-19 | Disposition: A | Discharge status: Home / Self Care | Payer: HMO | Source: Ambulatory Visit | Attending: Radiation Oncology | Admitting: Radiation Oncology

## 2021-08-19 ENCOUNTER — Other Ambulatory Visit: Payer: Self-pay

## 2021-08-19 DIAGNOSIS — Z510 Encounter for antineoplastic radiation therapy: Secondary | ICD-10-CM | POA: Insufficient documentation

## 2021-08-19 DIAGNOSIS — C541 Malignant neoplasm of endometrium: Secondary | ICD-10-CM | POA: Insufficient documentation

## 2021-08-19 LAB — CBC
HCT: 42.4 % (ref 36.0–46.0)
Hemoglobin: 13.5 g/dL (ref 12.0–15.0)
MCH: 27.6 pg (ref 26.0–34.0)
MCHC: 31.8 g/dL (ref 30.0–36.0)
MCV: 86.7 fL (ref 80.0–100.0)
Platelets: 170 10*3/uL (ref 150–400)
RBC: 4.89 MIL/uL (ref 3.87–5.11)
RDW: 13.3 % (ref 11.5–15.5)
WBC: 4.6 10*3/uL (ref 4.0–10.5)
nRBC: 0 % (ref 0.0–0.2)

## 2021-08-20 ENCOUNTER — Ambulatory Visit
Admission: RE | Admit: 2021-08-20 | Discharge: 2021-08-20 | Disposition: A | Discharge status: Home / Self Care | Payer: HMO | Source: Ambulatory Visit | Attending: Radiation Oncology | Admitting: Radiation Oncology

## 2021-08-20 DIAGNOSIS — C541 Malignant neoplasm of endometrium: Secondary | ICD-10-CM | POA: Diagnosis not present

## 2021-08-20 DIAGNOSIS — Z510 Encounter for antineoplastic radiation therapy: Secondary | ICD-10-CM | POA: Diagnosis not present

## 2021-08-23 ENCOUNTER — Ambulatory Visit
Admission: RE | Admit: 2021-08-23 | Discharge: 2021-08-23 | Disposition: A | Discharge status: Home / Self Care | Payer: HMO | Source: Ambulatory Visit | Attending: Radiation Oncology | Admitting: Radiation Oncology

## 2021-08-23 DIAGNOSIS — C541 Malignant neoplasm of endometrium: Secondary | ICD-10-CM | POA: Diagnosis not present

## 2021-08-23 DIAGNOSIS — Z510 Encounter for antineoplastic radiation therapy: Secondary | ICD-10-CM | POA: Diagnosis not present

## 2021-08-24 ENCOUNTER — Ambulatory Visit
Admission: RE | Admit: 2021-08-24 | Discharge: 2021-08-24 | Disposition: A | Discharge status: Home / Self Care | Payer: HMO | Source: Ambulatory Visit | Attending: Radiation Oncology | Admitting: Radiation Oncology

## 2021-08-24 DIAGNOSIS — C541 Malignant neoplasm of endometrium: Secondary | ICD-10-CM | POA: Diagnosis not present

## 2021-08-24 DIAGNOSIS — Z510 Encounter for antineoplastic radiation therapy: Secondary | ICD-10-CM | POA: Diagnosis not present

## 2021-08-25 ENCOUNTER — Ambulatory Visit
Admission: RE | Admit: 2021-08-25 | Discharge: 2021-08-25 | Disposition: A | Discharge status: Home / Self Care | Payer: HMO | Source: Ambulatory Visit | Attending: Radiation Oncology | Admitting: Radiation Oncology

## 2021-08-25 DIAGNOSIS — C541 Malignant neoplasm of endometrium: Secondary | ICD-10-CM | POA: Diagnosis not present

## 2021-08-25 DIAGNOSIS — Z510 Encounter for antineoplastic radiation therapy: Secondary | ICD-10-CM | POA: Diagnosis not present

## 2021-08-26 ENCOUNTER — Ambulatory Visit
Admission: RE | Admit: 2021-08-26 | Discharge: 2021-08-26 | Disposition: A | Discharge status: Home / Self Care | Payer: HMO | Source: Ambulatory Visit | Attending: Radiation Oncology | Admitting: Radiation Oncology

## 2021-08-26 ENCOUNTER — Other Ambulatory Visit: Payer: Self-pay

## 2021-08-26 ENCOUNTER — Inpatient Hospital Stay: Payer: HMO

## 2021-08-26 DIAGNOSIS — C541 Malignant neoplasm of endometrium: Secondary | ICD-10-CM | POA: Diagnosis not present

## 2021-08-26 DIAGNOSIS — Z510 Encounter for antineoplastic radiation therapy: Secondary | ICD-10-CM | POA: Diagnosis not present

## 2021-08-26 LAB — CBC
HCT: 40.1 % (ref 36.0–46.0)
Hemoglobin: 13 g/dL (ref 12.0–15.0)
MCH: 27.8 pg (ref 26.0–34.0)
MCHC: 32.4 g/dL (ref 30.0–36.0)
MCV: 85.9 fL (ref 80.0–100.0)
Platelets: 152 10*3/uL (ref 150–400)
RBC: 4.67 MIL/uL (ref 3.87–5.11)
RDW: 13.3 % (ref 11.5–15.5)
WBC: 3.9 10*3/uL — ABNORMAL LOW (ref 4.0–10.5)
nRBC: 0 % (ref 0.0–0.2)

## 2021-08-27 ENCOUNTER — Ambulatory Visit
Admission: RE | Admit: 2021-08-27 | Discharge: 2021-08-27 | Disposition: A | Discharge status: Home / Self Care | Payer: HMO | Source: Ambulatory Visit | Attending: Radiation Oncology | Admitting: Radiation Oncology

## 2021-08-27 DIAGNOSIS — Z510 Encounter for antineoplastic radiation therapy: Secondary | ICD-10-CM | POA: Diagnosis not present

## 2021-08-27 DIAGNOSIS — C541 Malignant neoplasm of endometrium: Secondary | ICD-10-CM | POA: Diagnosis not present

## 2021-08-30 ENCOUNTER — Ambulatory Visit
Admission: RE | Admit: 2021-08-30 | Discharge: 2021-08-30 | Disposition: A | Discharge status: Home / Self Care | Payer: HMO | Source: Ambulatory Visit | Attending: Radiation Oncology | Admitting: Radiation Oncology

## 2021-08-30 DIAGNOSIS — G4733 Obstructive sleep apnea (adult) (pediatric): Secondary | ICD-10-CM | POA: Diagnosis not present

## 2021-08-30 DIAGNOSIS — C541 Malignant neoplasm of endometrium: Secondary | ICD-10-CM | POA: Diagnosis not present

## 2021-08-30 DIAGNOSIS — Z510 Encounter for antineoplastic radiation therapy: Secondary | ICD-10-CM | POA: Diagnosis not present

## 2021-08-31 ENCOUNTER — Ambulatory Visit
Admission: RE | Admit: 2021-08-31 | Discharge: 2021-08-31 | Disposition: A | Discharge status: Home / Self Care | Payer: HMO | Source: Ambulatory Visit | Attending: Radiation Oncology | Admitting: Radiation Oncology

## 2021-08-31 DIAGNOSIS — Z510 Encounter for antineoplastic radiation therapy: Secondary | ICD-10-CM | POA: Diagnosis not present

## 2021-08-31 DIAGNOSIS — C541 Malignant neoplasm of endometrium: Secondary | ICD-10-CM | POA: Diagnosis not present

## 2021-09-01 ENCOUNTER — Ambulatory Visit
Admission: RE | Admit: 2021-09-01 | Discharge: 2021-09-01 | Disposition: A | Discharge status: Home / Self Care | Payer: HMO | Source: Ambulatory Visit | Attending: Radiation Oncology | Admitting: Radiation Oncology

## 2021-09-01 DIAGNOSIS — Z510 Encounter for antineoplastic radiation therapy: Secondary | ICD-10-CM | POA: Diagnosis not present

## 2021-09-01 DIAGNOSIS — C541 Malignant neoplasm of endometrium: Secondary | ICD-10-CM | POA: Diagnosis not present

## 2021-09-02 ENCOUNTER — Other Ambulatory Visit: Payer: Self-pay

## 2021-09-02 ENCOUNTER — Inpatient Hospital Stay: Payer: HMO

## 2021-09-02 ENCOUNTER — Ambulatory Visit
Admission: RE | Admit: 2021-09-02 | Discharge: 2021-09-02 | Disposition: A | Discharge status: Home / Self Care | Payer: HMO | Source: Ambulatory Visit | Attending: Radiation Oncology | Admitting: Radiation Oncology

## 2021-09-02 ENCOUNTER — Other Ambulatory Visit: Payer: Self-pay | Admitting: Family Medicine

## 2021-09-02 DIAGNOSIS — C541 Malignant neoplasm of endometrium: Secondary | ICD-10-CM | POA: Diagnosis not present

## 2021-09-02 DIAGNOSIS — Z510 Encounter for antineoplastic radiation therapy: Secondary | ICD-10-CM | POA: Diagnosis not present

## 2021-09-02 LAB — CBC
HCT: 40.5 % (ref 36.0–46.0)
Hemoglobin: 13.1 g/dL (ref 12.0–15.0)
MCH: 27.6 pg (ref 26.0–34.0)
MCHC: 32.3 g/dL (ref 30.0–36.0)
MCV: 85.4 fL (ref 80.0–100.0)
Platelets: 150 10*3/uL (ref 150–400)
RBC: 4.74 MIL/uL (ref 3.87–5.11)
RDW: 13.5 % (ref 11.5–15.5)
WBC: 4 10*3/uL (ref 4.0–10.5)
nRBC: 0 % (ref 0.0–0.2)

## 2021-09-03 ENCOUNTER — Ambulatory Visit: Payer: HMO

## 2021-09-03 ENCOUNTER — Ambulatory Visit
Admission: RE | Admit: 2021-09-03 | Discharge: 2021-09-03 | Disposition: A | Discharge status: Home / Self Care | Payer: HMO | Source: Ambulatory Visit | Attending: Radiation Oncology | Admitting: Radiation Oncology

## 2021-09-03 DIAGNOSIS — C541 Malignant neoplasm of endometrium: Secondary | ICD-10-CM | POA: Diagnosis not present

## 2021-09-03 DIAGNOSIS — Z510 Encounter for antineoplastic radiation therapy: Secondary | ICD-10-CM | POA: Diagnosis not present

## 2021-09-06 ENCOUNTER — Ambulatory Visit: Payer: HMO

## 2021-09-08 DIAGNOSIS — I10 Essential (primary) hypertension: Secondary | ICD-10-CM | POA: Insufficient documentation

## 2021-09-08 DIAGNOSIS — G4733 Obstructive sleep apnea (adult) (pediatric): Secondary | ICD-10-CM | POA: Diagnosis not present

## 2021-09-08 DIAGNOSIS — M170 Bilateral primary osteoarthritis of knee: Secondary | ICD-10-CM | POA: Diagnosis not present

## 2021-09-08 DIAGNOSIS — Z79899 Other long term (current) drug therapy: Secondary | ICD-10-CM | POA: Diagnosis not present

## 2021-09-08 DIAGNOSIS — E119 Type 2 diabetes mellitus without complications: Secondary | ICD-10-CM | POA: Diagnosis not present

## 2021-09-08 DIAGNOSIS — C549 Malignant neoplasm of corpus uteri, unspecified: Secondary | ICD-10-CM | POA: Diagnosis not present

## 2021-09-08 DIAGNOSIS — R54 Age-related physical debility: Secondary | ICD-10-CM | POA: Diagnosis not present

## 2021-09-08 DIAGNOSIS — Z01818 Encounter for other preprocedural examination: Secondary | ICD-10-CM | POA: Diagnosis not present

## 2021-09-08 DIAGNOSIS — Z6841 Body Mass Index (BMI) 40.0 and over, adult: Secondary | ICD-10-CM | POA: Diagnosis not present

## 2021-09-08 DIAGNOSIS — R350 Frequency of micturition: Secondary | ICD-10-CM | POA: Diagnosis not present

## 2021-09-08 DIAGNOSIS — C541 Malignant neoplasm of endometrium: Secondary | ICD-10-CM | POA: Diagnosis not present

## 2021-09-27 DIAGNOSIS — C541 Malignant neoplasm of endometrium: Secondary | ICD-10-CM | POA: Diagnosis not present

## 2021-09-27 DIAGNOSIS — G4733 Obstructive sleep apnea (adult) (pediatric): Secondary | ICD-10-CM | POA: Diagnosis not present

## 2021-09-27 DIAGNOSIS — Z8542 Personal history of malignant neoplasm of other parts of uterus: Secondary | ICD-10-CM | POA: Diagnosis not present

## 2021-09-27 DIAGNOSIS — I10 Essential (primary) hypertension: Secondary | ICD-10-CM | POA: Diagnosis not present

## 2021-09-27 DIAGNOSIS — E119 Type 2 diabetes mellitus without complications: Secondary | ICD-10-CM | POA: Diagnosis not present

## 2021-09-27 DIAGNOSIS — E785 Hyperlipidemia, unspecified: Secondary | ICD-10-CM | POA: Diagnosis not present

## 2021-09-27 DIAGNOSIS — Z886 Allergy status to analgesic agent status: Secondary | ICD-10-CM | POA: Diagnosis not present

## 2021-09-27 DIAGNOSIS — Z6841 Body Mass Index (BMI) 40.0 and over, adult: Secondary | ICD-10-CM | POA: Diagnosis not present

## 2021-09-27 DIAGNOSIS — Z888 Allergy status to other drugs, medicaments and biological substances status: Secondary | ICD-10-CM | POA: Diagnosis not present

## 2021-09-27 DIAGNOSIS — Z880 Allergy status to penicillin: Secondary | ICD-10-CM | POA: Diagnosis not present

## 2021-09-27 DIAGNOSIS — E1165 Type 2 diabetes mellitus with hyperglycemia: Secondary | ICD-10-CM | POA: Diagnosis not present

## 2021-09-27 DIAGNOSIS — Z79899 Other long term (current) drug therapy: Secondary | ICD-10-CM | POA: Diagnosis not present

## 2021-09-27 DIAGNOSIS — C549 Malignant neoplasm of corpus uteri, unspecified: Secondary | ICD-10-CM | POA: Diagnosis not present

## 2021-09-27 DIAGNOSIS — E8809 Other disorders of plasma-protein metabolism, not elsewhere classified: Secondary | ICD-10-CM | POA: Diagnosis not present

## 2021-09-29 DIAGNOSIS — C549 Malignant neoplasm of corpus uteri, unspecified: Secondary | ICD-10-CM | POA: Diagnosis not present

## 2021-10-01 DIAGNOSIS — C549 Malignant neoplasm of corpus uteri, unspecified: Secondary | ICD-10-CM | POA: Diagnosis not present

## 2021-10-04 DIAGNOSIS — Z79899 Other long term (current) drug therapy: Secondary | ICD-10-CM | POA: Diagnosis not present

## 2021-10-04 DIAGNOSIS — Z800 Family history of malignant neoplasm of digestive organs: Secondary | ICD-10-CM | POA: Diagnosis not present

## 2021-10-04 DIAGNOSIS — Z886 Allergy status to analgesic agent status: Secondary | ICD-10-CM | POA: Diagnosis not present

## 2021-10-04 DIAGNOSIS — G4733 Obstructive sleep apnea (adult) (pediatric): Secondary | ICD-10-CM | POA: Diagnosis not present

## 2021-10-04 DIAGNOSIS — Z880 Allergy status to penicillin: Secondary | ICD-10-CM | POA: Diagnosis not present

## 2021-10-04 DIAGNOSIS — E8809 Other disorders of plasma-protein metabolism, not elsewhere classified: Secondary | ICD-10-CM | POA: Diagnosis not present

## 2021-10-04 DIAGNOSIS — E1140 Type 2 diabetes mellitus with diabetic neuropathy, unspecified: Secondary | ICD-10-CM | POA: Diagnosis not present

## 2021-10-04 DIAGNOSIS — I10 Essential (primary) hypertension: Secondary | ICD-10-CM | POA: Diagnosis not present

## 2021-10-04 DIAGNOSIS — Z801 Family history of malignant neoplasm of trachea, bronchus and lung: Secondary | ICD-10-CM | POA: Diagnosis not present

## 2021-10-04 DIAGNOSIS — Z923 Personal history of irradiation: Secondary | ICD-10-CM | POA: Diagnosis not present

## 2021-10-04 DIAGNOSIS — Z8616 Personal history of COVID-19: Secondary | ICD-10-CM | POA: Diagnosis not present

## 2021-10-04 DIAGNOSIS — E785 Hyperlipidemia, unspecified: Secondary | ICD-10-CM | POA: Diagnosis not present

## 2021-10-04 DIAGNOSIS — E1165 Type 2 diabetes mellitus with hyperglycemia: Secondary | ICD-10-CM | POA: Diagnosis not present

## 2021-10-04 DIAGNOSIS — N3941 Urge incontinence: Secondary | ICD-10-CM | POA: Diagnosis not present

## 2021-10-04 DIAGNOSIS — C549 Malignant neoplasm of corpus uteri, unspecified: Secondary | ICD-10-CM | POA: Diagnosis not present

## 2021-10-04 DIAGNOSIS — C541 Malignant neoplasm of endometrium: Secondary | ICD-10-CM | POA: Diagnosis not present

## 2021-10-04 DIAGNOSIS — M170 Bilateral primary osteoarthritis of knee: Secondary | ICD-10-CM | POA: Diagnosis not present

## 2021-10-04 DIAGNOSIS — Z6841 Body Mass Index (BMI) 40.0 and over, adult: Secondary | ICD-10-CM | POA: Diagnosis not present

## 2021-10-06 ENCOUNTER — Ambulatory Visit: Payer: HMO | Admitting: Radiation Oncology

## 2021-10-12 DIAGNOSIS — I10 Essential (primary) hypertension: Secondary | ICD-10-CM | POA: Diagnosis not present

## 2021-10-12 DIAGNOSIS — H348392 Tributary (branch) retinal vein occlusion, unspecified eye, stable: Secondary | ICD-10-CM | POA: Diagnosis not present

## 2021-10-12 DIAGNOSIS — G4733 Obstructive sleep apnea (adult) (pediatric): Secondary | ICD-10-CM | POA: Diagnosis not present

## 2021-10-13 ENCOUNTER — Ambulatory Visit: Payer: HMO | Admitting: Radiation Oncology

## 2021-11-03 ENCOUNTER — Ambulatory Visit
Admission: RE | Admit: 2021-11-03 | Discharge: 2021-11-03 | Disposition: A | Discharge status: Home / Self Care | Payer: HMO | Source: Ambulatory Visit | Attending: Radiation Oncology | Admitting: Radiation Oncology

## 2021-11-03 VITALS — BP 141/89 | HR 76 | Temp 97.6°F | Resp 20

## 2021-11-03 DIAGNOSIS — C541 Malignant neoplasm of endometrium: Secondary | ICD-10-CM | POA: Diagnosis not present

## 2021-11-03 DIAGNOSIS — E119 Type 2 diabetes mellitus without complications: Secondary | ICD-10-CM | POA: Insufficient documentation

## 2021-11-03 DIAGNOSIS — R197 Diarrhea, unspecified: Secondary | ICD-10-CM | POA: Diagnosis not present

## 2021-11-03 DIAGNOSIS — Z923 Personal history of irradiation: Secondary | ICD-10-CM | POA: Diagnosis not present

## 2021-11-03 NOTE — Progress Notes (Signed)
Radiation Oncology ?Follow up Note ? ?Name: Kathryn Maddox   ?Date:   11/03/2021 ?MRN:  [id number] ?DOB: [birth year]  ? ? ?This 71 y.o. female presents to the clinic today for 1 month follow-up status post external beam radiation therapy as well as intracavitary HDR for FIGO grade 1 endometrial carcinoma with superficial invasion. ? ?REFERRING PROVIDER: Sofie Hartigan, MD ? 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  She is been recovering from increased lower urinary tract symptoms including marked frequency urgency.  She is also having intermittent diarrhea.  She is trying to avoid roughage.  She otherwise is stable.Marland Kitchen  She was admitted at [facility] for her diabetes.  That has improved. ? ?COMPLICATIONS OF TREATMENT: none ? ?FOLLOW UP COMPLIANCE: keeps appointments  ? ?PHYSICAL EXAM:  ?BP (!) 141/89 (BP Location: Left Wrist, Patient Position: Sitting, Cuff Size: Small)   Pulse 76   Temp 97.6 ?F (36.4 ?C) (Tympanic)   Resp 20  ?Well-developed well-nourished patient in NAD. HEENT reveals PERLA, EOMI, discs not visualized.  Oral cavity is clear. No oral mucosal lesions are identified. Neck is clear without evidence of cervical or supraclavicular adenopathy. Lungs are clear to A&P. Cardiac examination is essentially unremarkable with regular rate and rhythm without murmur rub or thrill. Abdomen is benign with no organomegaly or masses noted. Motor sensory and DTR levels are equal and symmetric in the upper and lower extremities. Cranial nerves II through XII are grossly intact. Proprioception is intact. No peripheral adenopathy or edema is identified. No motor or sensory levels are noted. Crude visual fields are within normal range. ? ?RADIOLOGY RESULTS: PET CT scan has  been ordered at [facility] ? ?PLAN: Present time patient is stable recovering with symptoms including increased lower Neri tract symptoms and diarrhea.  That seems to be improving over time.  She has a follow-up and PET CT scan with Dr. Christel Mormon at [facility] in the next several months.  I will review those films when the become available.  I have otherwise asked to see her back in 4 to 5 months for follow-up.  She will reestablish care with Dr. Theora Gianotti. ? ?I would like to take this opportunity to thank you for allowing me to participate in the care of your patient.. ?  ? Noreene Filbert, MD ? ?

## 2021-11-09 DIAGNOSIS — N390 Urinary tract infection, site not specified: Secondary | ICD-10-CM | POA: Diagnosis not present

## 2021-11-11 DIAGNOSIS — G4733 Obstructive sleep apnea (adult) (pediatric): Secondary | ICD-10-CM | POA: Diagnosis not present

## 2021-11-19 DIAGNOSIS — E1169 Type 2 diabetes mellitus with other specified complication: Secondary | ICD-10-CM | POA: Diagnosis not present

## 2021-11-19 DIAGNOSIS — I152 Hypertension secondary to endocrine disorders: Secondary | ICD-10-CM | POA: Diagnosis not present

## 2021-11-19 DIAGNOSIS — E1129 Type 2 diabetes mellitus with other diabetic kidney complication: Secondary | ICD-10-CM | POA: Diagnosis not present

## 2021-11-19 DIAGNOSIS — N390 Urinary tract infection, site not specified: Secondary | ICD-10-CM | POA: Diagnosis not present

## 2021-11-19 DIAGNOSIS — E785 Hyperlipidemia, unspecified: Secondary | ICD-10-CM | POA: Diagnosis not present

## 2021-11-19 DIAGNOSIS — E1159 Type 2 diabetes mellitus with other circulatory complications: Secondary | ICD-10-CM | POA: Diagnosis not present

## 2021-11-19 DIAGNOSIS — R809 Proteinuria, unspecified: Secondary | ICD-10-CM | POA: Diagnosis not present

## 2021-11-22 DIAGNOSIS — G4733 Obstructive sleep apnea (adult) (pediatric): Secondary | ICD-10-CM | POA: Diagnosis not present

## 2021-12-07 ENCOUNTER — Encounter: Payer: Self-pay | Admitting: Infectious Diseases

## 2021-12-07 ENCOUNTER — Ambulatory Visit: Payer: HMO | Attending: Infectious Diseases | Admitting: Infectious Diseases

## 2021-12-07 VITALS — BP 131/70 | HR 81 | Temp 98.0°F

## 2021-12-07 DIAGNOSIS — C541 Malignant neoplasm of endometrium: Secondary | ICD-10-CM | POA: Diagnosis not present

## 2021-12-07 DIAGNOSIS — Z923 Personal history of irradiation: Secondary | ICD-10-CM | POA: Diagnosis not present

## 2021-12-07 DIAGNOSIS — Z22338 Carrier of other streptococcus: Secondary | ICD-10-CM

## 2021-12-07 DIAGNOSIS — Z6841 Body Mass Index (BMI) 40.0 and over, adult: Secondary | ICD-10-CM | POA: Insufficient documentation

## 2021-12-07 DIAGNOSIS — G4733 Obstructive sleep apnea (adult) (pediatric): Secondary | ICD-10-CM | POA: Insufficient documentation

## 2021-12-07 DIAGNOSIS — E119 Type 2 diabetes mellitus without complications: Secondary | ICD-10-CM | POA: Diagnosis not present

## 2021-12-07 DIAGNOSIS — Z8542 Personal history of malignant neoplasm of other parts of uterus: Secondary | ICD-10-CM | POA: Insufficient documentation

## 2021-12-07 DIAGNOSIS — N3040 Irradiation cystitis without hematuria: Secondary | ICD-10-CM

## 2021-12-07 NOTE — Progress Notes (Signed)
NAME: Kathryn Maddox  DOB: 08/08/1950  MRN: [id number]  Date/Time: 12/07/2021 10:12 AM  REQUESTING PROVIDER: Ellison Hughs Subjective:  REASON FOR CONSULT: VRE in urine ? Kathryn Maddox is a 71 y.o. 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 She asked her PCP to check another urine and it was VRE and she was referred to me Pt is much better now, frequency and urgency down  No dysuria No hematuria No fever or chlls   Past Medical History:  Diagnosis Date   Carpal tunnel syndrome on both sides 03/2018   Cellulitis and abscess of lower extremity 03/2018   bilateral lower extrems, treated with antibx x 10 days   Diabetes mellitus without complication ([place])    Dyspnea 03/2018   with any exercise, due to weight   Eczema    Endometrial cancer ([place]) 08/26/2020   Hyperlipidemia    Hypertension    Morbid obesity with BMI of 70 and over, adult ([place]) 03/2018   Sleep apnea 03/2018   uses a cpap    Past Surgical History:  Procedure Laterality Date   CARPAL TUNNEL RELEASE Left 04/12/2018   Procedure: CARPAL TUNNEL RELEASE;  Surgeon: Hessie Knows, MD;  Location: [facility] ORS;  Service: Orthopedics;  Laterality: Left;   COLONOSCOPY WITH PROPOFOL N/A 12/02/2019   Procedure: COLONOSCOPY WITH PROPOFOL;  Surgeon: Toledo, Benay Pike, MD;  Location: [facility] ENDOSCOPY;  Service: Gastroenterology;  Laterality: N/A;   DILATION AND CURETTAGE OF UTERUS  03/2018   has had 10 d & c's with hysteroscopy   HERNIA REPAIR  2725   umbilical w/ obstructed bowel   LIPOMA EXCISION Right 1997   shoulder   obstructive bowel surgery  01/2010    Social History   Socioeconomic History   Marital status: Divorced    Spouse name: Not on file   Number of children: Not on  file   Years of education: Not on file   Highest education level: Not on file  Occupational History   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[place], now retired  Tobacco Use   Smoking status: Never   Smokeless tobacco: Never  Vaping Use   Vaping Use: Never used  Substance and Sexual Activity   Alcohol use: Yes    Comment: rarely   Drug use: Never   Sexual activity: Not Currently  Other Topics Concern   Not on file  Social History Narrative   Not on file   Social Determinants of Health   Financial Resource Strain: Not on file  Food Insecurity: Not on file  Transportation Needs: Not on file  Physical Activity: Not on file  Stress: Not on file  Social Connections: Not on file  Intimate Partner Violence: Not on file    Family History  Problem Relation Age of Onset   Breast cancer Neg Hx    Allergies  Allergen Reactions   Megace [Megestrol] Rash   Penicillins Rash and Other (See Comments)    Has patient had a PCN reaction causing immediate rash, facial/tongue/throat swelling, SOB or lightheadedness with hypotension: Unknown Has patient had a PCN reaction causing severe rash involving mucus membranes or skin necrosis: Unknown Has patient had a PCN reaction that required hospitalization:  Unknown Has patient had a PCN reaction occurring within the last 10 years: No If all of the above answers are "NO", then may proceed with Cephalosporin use.    Ibuprofen Hypertension   Pravastatin Other (See Comments)    Muscle pain, extreme constipation   I? Current Outpatient Medications  Medication Sig Dispense Refill   Easy Touch Lancets 30G/Twist MISC USE 1 EACH ONE TIME DAILY AS INSTRUCTED BY YOUR DOCTOR     losartan (COZAAR) 50 MG tablet Take 50 mg by mouth daily.     MOUNJARO 2.5 MG/0.5ML Pen Inject 2.5 mg into the skin.     rosuvastatin (CRESTOR) 5 MG tablet Take 5 mg by mouth daily.     No current facility-administered medications for this visit.      Abtx:  Anti-infectives (From admission, onward)    None       REVIEW OF SYSTEMS:  Const: negative fever, negative chills, she had weight loss of 70 pounds before she gained it all back Eyes: negative diplopia or visual changes, negative eye pain ENT: negative coryza, negative sore throat Resp: negative cough, hemoptysis, dyspnea Cards: negative for chest pain, palpitations, lower extremity edema GU: as above GI: Negative for abdominal pain, diarrhea, bleeding, constipation Skin: negative for rash and pruritus Heme: negative for easy bruising and gum/nose bleeding MS: arthralgia and body pain Neurolo:negative for headaches, dizziness, vertigo, memory problems  Psych: negative for feelings of anxiety, depression  Endocrine:  diabetes Allergy/Immunology- as above Objective:  VITALS:  BP 131/70   Pulse 81   Temp 98 F (36.7 C) (Temporal)  PHYSICAL EXAM:  General: Alert, cooperative, no distress, appears stated age. In wheel chair Lungs: Clear to auscultation bilaterally. No Wheezing or Rhonchi. No rales. Heart: Regular rate and rhythm, no murmur, rub or gallop. Abdomen: did not examine as in wheel chair Extremities: atraumatic, no cyanosis. No edema. No clubbing Skin: No rashes or lesions. Or bruising Lymph: Cervical, supraclavicular normal. Neurologic: Grossly non-focal Pertinent Labs Lab Results CBC    Component Value Date/Time   WBC 4.0 09/02/2021 1243   RBC 4.74 09/02/2021 1243   HGB 13.1 09/02/2021 1243   HCT 40.5 09/02/2021 1243   PLT 150 09/02/2021 1243   MCV 85.4 09/02/2021 1243   MCH 27.6 09/02/2021 1243   MCHC 32.3 09/02/2021 1243   RDW 13.5 09/02/2021 1243   LYMPHSABS 1.4 05/31/2021 1042   MONOABS 0.9 05/31/2021 1042   EOSABS 0.1 05/31/2021 1042   BASOSABS 0.0 05/31/2021 1042        No data to display            Microbiology: No results found for this or any previous visit (from the past 240 hour(s)).  IMAGING RESULTS: I have  personally reviewed the films ? Impression/Recommendation Endometrial carcinoma grade 1 s/p EBRT x25 and brachytherapy X 4 UTI with klebsiella following that secondary to foley catheter placement- treated with cefdinir - symptoms much better- residual symptoms of frequency and urgency 3/10 could be from radiation changes?  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 ? ___________________________________________________ Discussed with patient in detail  No antibiotic needed now Note:  This document was prepared using Dragon voice recognition software and may include unintentional dictation errors.

## 2021-12-07 NOTE — Patient Instructions (Signed)
  You had EBRT for endometrial cancer recently and brachytherapy- for the latter you needed foley catheter X 4 times- you had a UTI with klebsiella and was treated with cefdinir You checked your urine again after that and that was a vancomycin resistant enterococcus a stool bacteria which was likely contaminating the urine- as you r symptoms are much improved, we will not treat  You may have a combination of radiation induced vaginitis, urethritis , Genito urinary syndrome of menopause  *Estrace /Premarin cream topically- peasized apply topically three times a week ( please check with your OB/GYN regarding using this because of endometrial carcinoma) * Cetaphil to clean the genital area ( not soap)  * Cranberry supplement (-Knudsen/laewood cranberry concentrate- 1 ounce mixed with 8 ounces of water *wash with water after bowel movt *Probiotic for vaginal health( can try Pearls vaginal health) * Increase water consumption- 8 glasses a day *Ask your doctors not to check your urine on a routine basis  *Avoid antibiotics unless systemic infection/ or before cystoscopy * Kegel Exercise to strengthen pelvic floor

## 2021-12-12 DIAGNOSIS — G4733 Obstructive sleep apnea (adult) (pediatric): Secondary | ICD-10-CM | POA: Diagnosis not present

## 2021-12-15 ENCOUNTER — Encounter: Payer: Self-pay | Admitting: Obstetrics and Gynecology

## 2021-12-15 ENCOUNTER — Inpatient Hospital Stay: Payer: HMO | Attending: Obstetrics and Gynecology | Admitting: Obstetrics and Gynecology

## 2021-12-15 VITALS — BP 141/75 | HR 83 | Resp 18 | Ht 61.0 in | Wt 359.0 lb

## 2021-12-15 DIAGNOSIS — R350 Frequency of micturition: Secondary | ICD-10-CM | POA: Insufficient documentation

## 2021-12-15 DIAGNOSIS — Z923 Personal history of irradiation: Secondary | ICD-10-CM | POA: Insufficient documentation

## 2021-12-15 DIAGNOSIS — R197 Diarrhea, unspecified: Secondary | ICD-10-CM | POA: Insufficient documentation

## 2021-12-15 DIAGNOSIS — C541 Malignant neoplasm of endometrium: Secondary | ICD-10-CM | POA: Insufficient documentation

## 2021-12-15 NOTE — Progress Notes (Signed)
Gynecologic Oncology  Progress Note  Patient Care Team: Sofie Hartigan, MD as PCP - General (Family Medicine) Clent Jacks, RN as Oncology Nurse Navigator [place], [place], MD as Referring Physician (Gynecologic Oncology) Noreene Filbert, MD as Consulting Physician (Radiation Oncology) Shirline Frees, MD as Referring Physician (Radiation Oncology) Lonia Farber, MD as Consulting Physician (Endocrinology)   Name of the patient: Kathryn Maddox  [id number]  [birth year]   Date of visit: 12/15/21  Diagnosis/Chief complaint- Endometrial Cancer  Interval history- Patient is 71 female diagnosed with grade 1 endometrial cancer, suggestive of 1a disease on imaging s/p IUD, persistent disease on repeat biopsy, unable to tolerate norethindrone, IUD expelled, now s/p external beam radiation at [facility] followed by 4 fractions of vaginal brachytherapy at [facility], completed 10/04/21 who returns to clinic for follow up.   She has pet scheduled with Dr. Christel Mormon in August. She is having ongoing burning and frequency of urination. 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 She has not seen urology for management. Continues to have some fecal incontinence/urgency post radiation and occasional intermittent diarrhea. Bleeding has stopped and pain has resolved.     Gynecologic Oncology History Kathryn Maddox is a pleasant G19 female with multiple medical issues including morbid obesity, poorly controlled diabetes, and sleep apnea who is seen in consultation from Dr. Leonides Schanz for grade 1 endometrial cancer, s/p IUD placement 12/02/20 as she is poor surgical candidate.   She has a long history of abnormal vaginal bleeding and menorrhagia and anemia due to chronic blood loss going back to her 30's. She had a syncopal episode from losing so much blood. In [place], had 9 D&Cs with  hysteroscopy, last one was in 2010, and bleeding always continued. She was treated with progesterone injections to stop bleeding. In '92 she had precancerous cells in uterus and was seeing oncologist for this. Was supposed to get a hysterectomy with oncologist, but had a bowel obstruction and surgery was denied until she was recovered. Based on what she described she had a ventral hernia that was repaired and she does not know if bowel was resected or not. She had not been to gynecologist since 2011 until she was seen by Dr. Leonides Schanz 07/30/2020. She has had postmenopausal bleeding x 7-8 years.   Her work up has included pelvic US, EMBx, and labs.    Pelvic US 08/14/2020 at [facility]: Uterus: Measurements: 7.7 x 5.3 x 5.8 cm = volume: 123 mL. No fibroids or other mass visualized. Endometrium: Thickness: 10 mm.  No focal abnormality visualized. Right ovary: Nonvisualized. Left ovary: Nonvisualized. Other findings: No abnormal free fluid.   08/14/2020 EMBX showed a WELL DIFFERENTIATED ENDOMETRIAL ADENOCARCINOMA   She was previously on Megace and had a rash.   Opted for IUD for conservative management.   03/24/2021 EMBX - ENDOMETRIOID CARCINOMA, LOW-GRADE (FIGO 1), WITH TREATMENT EFFECT.    Pelvic MRI 04/07/2021 FINDINGS:  -- Uterus: Measures 10.2 x 6.3 x 7.5 cm (volume = 250 cm^3). Endometrial thickness measures 13 mm, without significant change compared to previous study. IUD again noted. Indistinct endometrial-myometrial junction is suspicious for superficial myometrial invasion, however there is no evidence of deep (> 50%) myometrial invasion. Cervix and vagina are unremarkable. -- Bilateral ovaries: Appears normal. No ovarian or adnexal masses IMPRESSION: Stable endometrial thickening measuring 13 mm, with IUD again noted. Probable superficial myometrial invasion, also without significant change. (FIGO stage IA). No evidence  of pelvic metastatic disease. Sigmoid colon diverticulosis, without  evidence of diverticulitis.    06/09/2021 Exam IUD noted to be expelled.   Past Medical History:  Diagnosis Date   Carpal tunnel syndrome on both sides 03/2018   Cellulitis and abscess of lower extremity 03/2018   bilateral lower extrems, treated with antibx x 10 days   Diabetes mellitus without complication ([place])    Dyspnea 03/2018   with any exercise, due to weight   Eczema    Endometrial cancer ([place]) 08/26/2020   Hyperlipidemia    Hypertension    Morbid obesity with BMI of 70 and over, adult ([place]) 03/2018   Sleep apnea 03/2018   uses a cpap   Past Surgical History:  Procedure Laterality Date   CARPAL TUNNEL RELEASE Left 04/12/2018   Procedure: CARPAL TUNNEL RELEASE;  Surgeon: Hessie Knows, MD;  Location: [facility] ORS;  Service: Orthopedics;  Laterality: Left;   COLONOSCOPY WITH PROPOFOL N/A 12/02/2019   Procedure: COLONOSCOPY WITH PROPOFOL;  Surgeon: Toledo, Benay Pike, MD;  Location: [facility] END[facility]COPY;  Service: Gastroenterology;  Laterality: N/A;   DILATION AND CURETTAGE OF UTERUS  03/2018   has had 10 d & c's with hysteroscopy   HERNIA REPAIR  6384   umbilical w/ obstructed bowel   LIPOMA EXCISION Right 1997   shoulder   obstructive bowel surgery  01/2010   Social History   Socioeconomic History   Marital status: Divorced    Spouse name: Not on file   Number of children: Not on file   Years of education: Not on file   Highest education level: Not on file  Occupational History   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[place], now retired  Tobacco Use   Smoking status: Never   Smokeless tobacco: Never  Vaping Use   Vaping Use: Never used  Substance and Sexual Activity   Alcohol use: Yes    Comment: rarely   Drug use: Never   Sexual activity: Not Currently  Other Topics Concern   Not on file  Social History Narrative   Not on file   Social Determinants of Health   Financial Resource Strain: Not on file  Food Insecurity: Not on file   Transportation Needs: Not on file  Physical Activity: Not on file  Stress: Not on file  Social Connections: Not on file  Intimate Partner Violence: Not on file   Family History  Problem Relation Age of Onset   Breast cancer Neg Hx    Allergies  Allergen Reactions   Megace [Megestrol] Rash   Penicillins Rash and Other (See Comments)    Has patient had a PCN reaction causing immediate rash, facial/tongue/throat swelling, SOB or lightheadedness with hypotension: Unknown Has patient had a PCN reaction causing severe rash involving mucus membranes or skin necrosis: Unknown Has patient had a PCN reaction that required hospitalization: Unknown Has patient had a PCN reaction occurring within the last 10 years: No If all of the above answers are "NO", then may proceed with Cephalosporin use.    Ibuprofen Hypertension   Pravastatin Other (See Comments)    Muscle pain, extreme constipation    Current Outpatient Medications:    losartan (COZAAR) 50 MG tablet, Take 50 mg by mouth daily., Disp: , Rfl:    rosuvastatin (CRESTOR) 5 MG tablet, Take 5 mg by mouth daily., Disp: , Rfl:    tirzepatide (MOUNJARO) 5 MG/0.5ML Pen, INJECT 5 MG UNDER THE SKIN EVERY 7 DAYS, Disp: ,  Rfl:    Easy Touch Lancets 30G/Twist MISC, USE 1 EACH ONE TIME DAILY AS INSTRUCTED BY YOUR DOCTOR (Patient not taking: Reported on 12/15/2021), Disp: , Rfl:    Review of Systems General:  fatigue Skin: no complaints Eyes: no complaints HEENT: no complaints Breasts: no complaints Pulmonary: no complaints Cardiac: no complaints Gastrointestinal: per hpi Genitourinary/Sexual: per hpi Ob/Gyn: per hpi Musculoskeletal: no complaints Hematology: no complaints Neurologic/Psych: no complaints    Physical exam:  Vitals:   12/15/21 1004 12/15/21 1008  BP:  (!) 141/75  Pulse:  83  Resp:  18  Weight: (!) 359 lb (162.8 kg)   Height:  '5\' 1"'$  (1.549 m)    GENERAL: Patient is a well appearing female in no acute distress.  OBese.  HEENT:  PERRL, neck supple with midline trachea.  NODES:  No cervical, supraclavicular, axillary, or inguinal lymphadenopathy palpated.  LUNGS:  Clear to auscultation bilaterally.  No wheezes or rhonchi. HEART:  Regular rate and rhythm. No murmur appreciated. ABDOMEN:  Soft, nontender.  Positive, normoactive bowel sounds.  MSK:  No focal spinal tenderness to palpation. Full range of motion bilaterally in the upper extremities. EXTREMITIES:  No peripheral edema.   SKIN:  Clear with no obvious rashes or skin changes. Hemosiderin staining BLE/chronic NEURO:  Nonfocal. Well oriented.  Appropriate affect.   Pelvic: chaperoned by RNs two people needed to flex thighs onto abdomen to visualize. Large, Extra-long Graves speculum was utilized.  EGBUS: no lesions Cervix: the cervix is adherent to the posterior vagina.. the anterior aspect could be seen and there were no lesions. On palpation nontender Vagina: no lesions, no discharge or bleeding Uterus: nontender. Unable to determine size due to habitus Adnexa: no palpable masses but limited by exam     Assessment:  Kathryn Maddox is a 71 y.o. female diagnosed with grade 1 endometrial cancer 2/22 on endometrial biopsy. Morbid obesity with BMI 72. MRI with 13 mm stripe. No or minimal myometrial invasion, less than 50%, suggesting stage 1a disease by imaging.  Dr Leafy Ro placed [place] IUD in clinic 4/22. Repeat biopsy 03/24/21 showed persistent grade 1 cancer.  Repeat MRI 10/22 stable. Unable to tolerate norethindrone and IUD expelled. Challenging pelvic exam. S/p external beam radiation and vaginal brachytherapy. Vaginal agglutination to the cervix at the apex. Exam otherwise reassuring.   Possible radiation cystitis.  Diarrhea    Medical co-morbidities complicating care: HTN and diabetes non-insulin dependent, morbid obesity (BMI 72) and sleep apnea Plan:       Problem List Items Addressed This Visit            Genitourinary     Endometrial cancer ([place]) - Primary       Continue close surveillance including follow up with radiation oncology. PET scan ordered for August.    Recommend Urology for evaluation of possible radiation cystitis.    We have recommended continued close follow up with exams, including pelvic exams every 3-6 months for 2-3 years, then every 6-12 months for 3-5 years and then annually thereafter.  Imaging and laboratory assessment is based on clinical indication. Reviewed dilator therapy. She has appt with Dr. Christel Mormon in August, and will discuss with him. Recommend moving Dr. Donette Larry appt to November and we can see her in 07/2021.   We discussed diarrhea issues which hopefully will continue to resolve. She is following a radiation diet. We recommended a food diary and if her symptoms do not improve GI consult.   Beckey Rutter, DNP, AGNP-C [place]  at [facility] [phone] (clinic)  I personally had a face to face interaction and evaluated the patient jointly with the NP, Ms. Beckey Rutter.  I have reviewed her history and available records and have performed the key portions of the physical exam including general, HEENT, abdominal exam, pelvic exam with my findings confirming those documented above by the APP.  I have discussed the case with the APP and the patient.  I agree with the above documentation, assessment and plan which was fully formulated by me.  Counseling was completed by me.   I personally saw the patient and performed a substantive portion of this encounter in conjunction with the listed APP as documented above.  Rhayne Chatwin [name], MD

## 2021-12-15 NOTE — Progress Notes (Signed)
Patient reports feeling tired, diarrhea, urinary frequency, recent UTI, decreased appetite, nausea in the mornings that goes away (burning), neuropathy in hands and feet.

## 2022-01-11 DIAGNOSIS — G4733 Obstructive sleep apnea (adult) (pediatric): Secondary | ICD-10-CM | POA: Diagnosis not present

## 2022-01-19 DIAGNOSIS — C549 Malignant neoplasm of corpus uteri, unspecified: Secondary | ICD-10-CM | POA: Diagnosis not present

## 2022-01-20 ENCOUNTER — Encounter: Payer: Self-pay | Admitting: Obstetrics and Gynecology

## 2022-02-02 ENCOUNTER — Inpatient Hospital Stay: Payer: HMO | Attending: Obstetrics and Gynecology | Admitting: Obstetrics and Gynecology

## 2022-02-02 VITALS — BP 132/54 | HR 70 | Temp 98.7°F | Resp 20 | Wt 349.0 lb

## 2022-02-02 DIAGNOSIS — R102 Pelvic and perineal pain: Secondary | ICD-10-CM | POA: Diagnosis not present

## 2022-02-02 DIAGNOSIS — R159 Full incontinence of feces: Secondary | ICD-10-CM | POA: Diagnosis not present

## 2022-02-02 DIAGNOSIS — Z79890 Hormone replacement therapy (postmenopausal): Secondary | ICD-10-CM | POA: Diagnosis not present

## 2022-02-02 DIAGNOSIS — N950 Postmenopausal bleeding: Secondary | ICD-10-CM | POA: Insufficient documentation

## 2022-02-02 DIAGNOSIS — N3040 Irradiation cystitis without hematuria: Secondary | ICD-10-CM | POA: Insufficient documentation

## 2022-02-02 DIAGNOSIS — Z923 Personal history of irradiation: Secondary | ICD-10-CM | POA: Insufficient documentation

## 2022-02-02 DIAGNOSIS — Z6841 Body Mass Index (BMI) 40.0 and over, adult: Secondary | ICD-10-CM | POA: Insufficient documentation

## 2022-02-02 DIAGNOSIS — I10 Essential (primary) hypertension: Secondary | ICD-10-CM | POA: Insufficient documentation

## 2022-02-02 DIAGNOSIS — G4730 Sleep apnea, unspecified: Secondary | ICD-10-CM | POA: Diagnosis not present

## 2022-02-02 DIAGNOSIS — E119 Type 2 diabetes mellitus without complications: Secondary | ICD-10-CM | POA: Insufficient documentation

## 2022-02-02 DIAGNOSIS — Z7189 Other specified counseling: Secondary | ICD-10-CM | POA: Diagnosis not present

## 2022-02-02 DIAGNOSIS — E785 Hyperlipidemia, unspecified: Secondary | ICD-10-CM | POA: Diagnosis not present

## 2022-02-02 DIAGNOSIS — C541 Malignant neoplasm of endometrium: Secondary | ICD-10-CM | POA: Insufficient documentation

## 2022-02-02 DIAGNOSIS — L309 Dermatitis, unspecified: Secondary | ICD-10-CM | POA: Diagnosis not present

## 2022-02-02 DIAGNOSIS — Z79899 Other long term (current) drug therapy: Secondary | ICD-10-CM | POA: Diagnosis not present

## 2022-02-02 NOTE — Progress Notes (Signed)
[facility]  Progress Note  Patient Care Team: Sofie Hartigan, MD as PCP - General (Family Medicine) Clent Jacks, RN as Oncology Nurse Navigator [place], [place], MD as Referring Physician (Gynecologic Oncology) Noreene Filbert, MD as Consulting Physician (Radiation Oncology) Shirline Frees, MD as Referring Physician (Radiation Oncology) Lonia Farber, MD as Consulting Physician (Endocrinology)   Name of the patient: Kathryn Maddox  [id number]  31-Aug-1950   Date of visit: 02/02/22  Diagnosis/Chief complaint- Endometrial Cancer  Interval history- Patient is 19 female diagnosed with grade 1 endometrial cancer, suggestive of 1a disease on imaging s/p IUD, persistent disease on repeat biopsy, unable to tolerate norethindrone, IUD expelled, now s/p external beam radiation at [place] followed by 4 fractions of vaginal brachytherapy at [facility], completed 10/04/21 who returns to clinic for follow up and discussion of PET results.   She had PET and [facility] on 01/19/22.  Head & Neck:  - Visualized Head: No suspicious tracer uptake.  - Lymph Nodes: No suspicious tracer uptake. No lymphadenopathy.  - Thyroid: No suspicious tracer uptake.   Chest:  - Lymph Nodes & Mediastinum: No suspicious tracer uptake.  - Thoracic Vasculature: No suspicious tracer uptake.  - Heart & Pericardium: No suspicious tracer uptake.  - Lungs & Pleura: No suspicious tracer uptake.   Abdomen & Pelvis:  - Liver:  No suspicious tracer uptake.  - Biliary & Gallbladder: No suspicious tracer uptake. Cholelithiasis.  - Spleen: No suspicious tracer uptake.  - Pancreas: No suspicious tracer uptake.  - Adrenal Glands: No suspicious tracer uptake.  - Kidneys: No suspicious tracer uptake.  - Genitourinary: Focal area of markedly increased FDG activity in the fundus of the uterus (series 3 image 214).  - Abdominal & Pelvic Vasculature: No suspicious tracer uptake.  -  Gastrointestinal: No suspicious tracer uptake.  - Peritoneum, Mesentery, & Retroperitoneum: No suspicious tracer uptake.  - Lymph Nodes: There is a prominent right pelvic lymph node measuring up to 1.1 cm with markedly increased FDG activity (series 3 image 211). There is also a small presacral node with increased FDG avidity (series 3 image 95).   Musculoskeletal:  - Bones:  No suspicious tracer uptake.  - Soft Tissues: No suspicious tracer uptake.   IMPRESSION:  1.  Focal area of markedly increased FDG activity in the fundus of the uterus which may represent uterine cancer  2.  Subcentimeter right pelvic and presacral lymph nodes with intense FDG activity concerning for nodal metastases.    She presents today to discuss treatment options  Gynecologic Oncology History Kathryn Maddox is a pleasant G29 female with multiple medical issues including morbid obesity, poorly controlled diabetes, and sleep apnea who is seen in consultation from Dr. Leonides Schanz for grade 1 endometrial cancer, s/p IUD placement 12/02/20 as she is poor surgical candidate.   She has a long history of abnormal vaginal bleeding and menorrhagia and anemia due to chronic blood loss going back to her 30's. She had a syncopal episode from losing so much blood. In [place], had 9 D&Cs with hysteroscopy, last one was in 2010, and bleeding always continued. She was treated with progesterone injections to stop bleeding. In '92 she had precancerous cells in uterus and was seeing oncologist for this. Was supposed to get a hysterectomy with oncologist, but had a bowel obstruction and surgery was denied until she was recovered. Based on what she described she had a ventral hernia that was repaired and she does not know if  bowel was resected or not. She had not been to gynecologist since 2011 until she was seen by Dr. Leonides Schanz 07/30/2020. She has had postmenopausal bleeding x 7-8 years.   Her work up has included pelvic US, EMBx, and labs.    Pelvic US  08/14/2020 at [facility]: Uterus: Measurements: 7.7 x 5.3 x 5.8 cm = volume: 123 mL. No fibroids or other mass visualized. Endometrium: Thickness: 10 mm.  No focal abnormality visualized. Right ovary: Nonvisualized. Left ovary: Nonvisualized. Other findings: No abnormal free fluid.   08/14/2020 EMBX showed a WELL DIFFERENTIATED ENDOMETRIAL ADENOCARCINOMA   She was previously on Megace and had a rash.   Opted for IUD for conservative management.   03/24/2021 EMBX - ENDOMETRIOID CARCINOMA, LOW-GRADE (FIGO 1), WITH TREATMENT EFFECT.    Pelvic MRI 04/07/2021 FINDINGS:  -- Uterus: Measures 10.2 x 6.3 x 7.5 cm (volume = 250 cm^3). Endometrial thickness measures 13 mm, without significant change compared to previous study. IUD again noted. Indistinct endometrial-myometrial junction is suspicious for superficial myometrial invasion, however there is no evidence of deep (> 50%) myometrial invasion. Cervix and vagina are unremarkable. -- Bilateral ovaries: Appears normal. No ovarian or adnexal masses IMPRESSION: Stable endometrial thickening measuring 13 mm, with IUD again noted. Probable superficial myometrial invasion, also without significant change. (FIGO stage IA). No evidence of pelvic metastatic disease. Sigmoid colon diverticulosis, without evidence of diverticulitis.   06/09/2021 Exam IUD noted to be expelled.   She is having ongoing burning and frequency of urination. 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 She has not seen urology for management. Continues to have some fecal incontinence/urgency post radiation and occasional intermittent diarrhea. Bleeding has stopped and pain has resolved.   Past Medical History:  Diagnosis Date   Carpal tunnel syndrome on both sides 03/2018   Cellulitis and abscess of lower extremity 03/2018   bilateral lower extrems, treated  with antibx x 10 days   Diabetes mellitus without complication ([place])    Dyspnea 03/2018   with any exercise, due to weight   Eczema    Endometrial cancer ([place]) 08/26/2020   Hyperlipidemia    Hypertension    Morbid obesity with BMI of 70 and over, adult ([place]) 03/2018   Sleep apnea 03/2018   uses a cpap   Past Surgical History:  Procedure Laterality Date   CARPAL TUNNEL RELEASE Left 04/12/2018   Procedure: CARPAL TUNNEL RELEASE;  Surgeon: Hessie Knows, MD;  Location: [facility] ORS;  Service: Orthopedics;  Laterality: Left;   COLONOSCOPY WITH PROPOFOL N/A 12/02/2019   Procedure: COLONOSCOPY WITH PROPOFOL;  Surgeon: Toledo, Benay Pike, MD;  Location: [facility] ENDOSCOPY;  Service: Gastroenterology;  Laterality: N/A;   DILATION AND CURETTAGE OF UTERUS  03/2018   has had 10 d & c's with hysteroscopy   HERNIA REPAIR  3474   umbilical w/ obstructed bowel   LIPOMA EXCISION Right 1997   shoulder   obstructive bowel surgery  01/2010   Social History   Socioeconomic History   Marital status: Divorced    Spouse name: Not on file   Number of children: Not on file   Years of education: Not on file   Highest education level: Not on file  Occupational History   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[place], now retired  Tobacco Use   Smoking status: Never   Smokeless tobacco: Never  Media planner  Vaping Use: Never used  Substance and Sexual Activity   Alcohol use: Yes    Comment: rarely   Drug use: Never   Sexual activity: Not Currently  Other Topics Concern   Not on file  Social History Narrative   Not on file   Social Determinants of Health   Financial Resource Strain: Not on file  Food Insecurity: Not on file  Transportation Needs: Not on file  Physical Activity: Not on file  Stress: Not on file  Social Connections: Not on file  Intimate Partner Violence: Not on file   Family History  Problem Relation Age of Onset   Breast cancer Neg Hx    Allergies   Allergen Reactions   Megace [Megestrol] Rash   Penicillins Rash and Other (See Comments)    Has patient had a PCN reaction causing immediate rash, facial/tongue/throat swelling, SOB or lightheadedness with hypotension: Unknown Has patient had a PCN reaction causing severe rash involving mucus membranes or skin necrosis: Unknown Has patient had a PCN reaction that required hospitalization: Unknown Has patient had a PCN reaction occurring within the last 10 years: No If all of the above answers are "NO", then may proceed with Cephalosporin use.    Ibuprofen Hypertension   Pravastatin Other (See Comments)    Muscle pain, extreme constipation   Current Outpatient Medications:    losartan (COZAAR) 50 MG tablet, Take 50 mg by mouth daily., Disp: , Rfl:    rosuvastatin (CRESTOR) 5 MG tablet, Take 5 mg by mouth daily., Disp: , Rfl:    tirzepatide (MOUNJARO) 5 MG/0.5ML Pen, INJECT 5 MG UNDER THE SKIN EVERY 7 DAYS, Disp: , Rfl:    Easy Touch Lancets 30G/Twist MISC, USE 1 EACH ONE TIME DAILY AS INSTRUCTED BY YOUR DOCTOR (Patient not taking: Reported on 12/15/2021), Disp: , Rfl:   Review of Systems General:  no complaints Skin: no complaints Eyes: no complaints HEENT: no complaints Breasts: no complaints Pulmonary: no complaints Cardiac: no complaints Gastrointestinal: no complaints Genitourinary/Sexual: no complaints Ob/Gyn: positive for pelvic pain Musculoskeletal: no complaints Hematology: no complaints Neurologic/Psych: no complaints   Physical exam:  Vitals:   02/02/22 1445  BP: (!) 132/54  Pulse: 70  Resp: 20  Temp: 98.7 F (37.1 C)  SpO2: 100%  Weight: (!) 349 lb (158.3 kg)    GENERAL: Patient is a well appearing female in no acute distress. Obese.  HEENT:  atraumatic normocephalic LUNGS:  Clear to auscultation bilaterally.   HEART:  Regular rate and rhythm.  ABDOMEN:  Soft, nontender.   NEURO:  Nonfocal. Well oriented.  Appropriate affect.   Pelvic: deferred    Assessment:  Seraphim Affinito is a 71 y.o. female diagnosed with grade 1 endometrial cancer 2/22 on endometrial biopsy. Morbid obesity with BMI 72. MRI with 13 mm stripe. No or minimal myometrial invasion, less than 50%, suggesting stage 1a disease by imaging.  Dr Leafy Ro placed [place] IUD in clinic 4/22. Repeat biopsy 03/24/21 showed persistent grade 1 cancer.  Repeat MRI 10/22 stable. Unable to tolerate norethindrone and IUD expelled. Challenging pelvic exam. S/p external beam radiation and vaginal brachytherapy. Vaginal agglutination to the cervix at the apex. PET + findings indicate recurrent disease with possible metastasis to the pelvic lymph nodes.     Medical co-morbidities complicating care: HTN and diabetes non-insulin dependent, morbid obesity (BMI 72) and sleep apnea Plan:       Problem List Items Addressed This Visit  Genitourinary    Endometrial cancer ([place]) - Primary       Continue close surveillance with radiation oncology. PET scan ordered 04/27/2022.   We discussed treatment goals of care.  At this point she is most interested in quality of life.  We reviewed that it is hard to determine prognosis at this time given one imaging study.  We will be able to determine better whether or not she has indolent or aggressive disease based on her subsequent imaging.  I do think it be worthwhile to see the medical oncology team and discussed the different options that exist including hormonal, chemotherapy and biologic therapy.  We will request tumor testing for p53 and mismatch repair protein expression on her endometrial biopsy.   Continue close follow-up with repeat visit after her PET scan in November.  Beckey Rutter, DNP, AGNP-C [place] at [facility] [phone] (clinic)   I personally had a face to face interaction and evaluated the patient jointly with the NP, Ms. Beckey Rutter.  I have reviewed her history and available records and have performed the key  portions of the physical exam including general, HEENT, abdominal exam, pelvic exam with my findings confirming those documented above by the APP.  I have discussed the case with the APP and the patient.  I agree with the above documentation, assessment and plan which was fully formulated by me.  Counseling was completed by me.   I personally saw the patient and performed a substantive portion of this encounter in conjunction with the listed APP as documented above.  Skyelyn Scruggs [name], MD

## 2022-02-08 ENCOUNTER — Ambulatory Visit: Payer: HMO | Admitting: Internal Medicine

## 2022-02-08 ENCOUNTER — Other Ambulatory Visit: Payer: HMO

## 2022-02-08 DIAGNOSIS — E782 Mixed hyperlipidemia: Secondary | ICD-10-CM | POA: Diagnosis not present

## 2022-02-08 DIAGNOSIS — H348392 Tributary (branch) retinal vein occlusion, unspecified eye, stable: Secondary | ICD-10-CM | POA: Diagnosis not present

## 2022-02-09 ENCOUNTER — Ambulatory Visit
Admission: RE | Admit: 2022-02-09 | Discharge: 2022-02-09 | Disposition: A | Discharge status: Home / Self Care | Payer: HMO | Source: Ambulatory Visit | Attending: Family Medicine | Admitting: Family Medicine

## 2022-02-09 ENCOUNTER — Ambulatory Visit: Payer: HMO

## 2022-02-09 DIAGNOSIS — Z1231 Encounter for screening mammogram for malignant neoplasm of breast: Secondary | ICD-10-CM | POA: Diagnosis not present

## 2022-02-10 ENCOUNTER — Inpatient Hospital Stay (HOSPITAL_BASED_OUTPATIENT_CLINIC_OR_DEPARTMENT_OTHER): Payer: HMO | Admitting: Internal Medicine

## 2022-02-10 ENCOUNTER — Inpatient Hospital Stay: Payer: HMO

## 2022-02-10 ENCOUNTER — Encounter: Payer: Self-pay | Admitting: Internal Medicine

## 2022-02-10 VITALS — BP 143/84 | HR 78 | Temp 99.0°F | Resp 18 | Wt 355.4 lb

## 2022-02-10 DIAGNOSIS — C541 Malignant neoplasm of endometrium: Secondary | ICD-10-CM

## 2022-02-10 DIAGNOSIS — E119 Type 2 diabetes mellitus without complications: Secondary | ICD-10-CM | POA: Diagnosis not present

## 2022-02-10 DIAGNOSIS — I10 Essential (primary) hypertension: Secondary | ICD-10-CM | POA: Diagnosis not present

## 2022-02-10 LAB — SURGICAL PATHOLOGY

## 2022-02-10 NOTE — Progress Notes (Signed)
[place] NOTE  Patient Care Team: Sofie Hartigan, MD as PCP - General (Family Medicine) Clent Jacks, RN as Oncology Nurse Navigator [place], [place], MD as Referring Physician (Gynecologic Oncology) Noreene Filbert, MD as Consulting Physician (Radiation Oncology) Shirline Frees, MD as Referring Physician (Radiation Oncology) Lonia Farber, MD as Consulting Physician (Endocrinology)  REFERRING PROVIDER: Dr. Theora Gianotti  REASON FOR REFFERAL: progression of endometrial cancer. Discuss systemic Rx options.   CANCER STAGING   Cancer Staging  Endometrioid cancer Stage III based on imaging   ASSESSMENT & PLAN:  Kathryn Maddox is a 71 yo F with pmh of morbid obesity, HTN and HLD was diagnosed with endometrioid cancer, grade 1, staged 1A per imaging in Feb 2022, referred to medical oncology to discuss about systemic treatment options upon progression.   #Endometrioid cancer, Stage III per imaging, pMMR and p53 wildtype - diagnosed with stage 1A grade 1 endometrioid cancer in Feb 2022 after post menopausal bleeding.  - not a surgical candidate due to morbid obesity  - s/p Mirena IUD with persistance of disease evident on repeat EMBx done in 10/22  - could not tolerate Norethindrone  - S/p EBRT at [place] followed by 4 fractions of vaginal brachytherapy at [place] completed on 10/04/2021.  - PET/CT from 01/19/2022 showed focal area of increased FDG activity in fundus of the uterus and right pelvic and presacral lymph nodes concerning for nodal metastases.   - We discussed in detail about the staging, diagnosis and treatment options. Discussed about treatment with carboplatin and paclitaxel every 3 weeks for 4-6 cycles depending on the tolerability. Side effects including but not limited to drop in blood counts, increased infection risks, need for transfusion, renal dysfunction, neuropathy, hair thinning, nausea, vomiting discussed. Considering her age and  functional status, I will dose reduce carboplatin to AUC 4 and paclitaxel 135 mg/m2. Patient has preexisting long standing neuropathy from diabetes. She reports constant numbness in bilateral hands and altered sensation in feet. Discussed about buying cryo mittens and booties to help reduce the risk of worsening neuropathy. Will closely monitor for any signs of worsening of neuropathy. Based on NRG-GY08, at this time there is only PFS benefit seen with addition of pembrolizumab mainly with dMMR group vs pMMR. Also, it requires to be on maintenance for 2 years. I discussed this with the patient and she would like to proceed with carbo/taxol. Down the line, if she progresses, we can consider Pembrolizumab plus Lenvatinib.  Chemotherapy teach session next week.  She wants to hold off on port placement at this time.  Treatment plan added. We will obtain labs mid cycle to monitor counts closely.  Advised to hold off on eye injection/laser and cataract surgery at this time.   RTC on 02/22/22 for md visit, labs, cycle 1, then in 3 weeks for md visit, labs, c2.   The total time spent in the appointment was 60 minutes encounter with patients including review of chart and various tests results, discussions about plan of care and coordination of care plan   All questions were answered. The patient knows to call the clinic with any problems, questions or concerns. No barriers to learning was detected.   Kathryn Canary, MD 8/25/20239:46 AM   HISTORY OF PRESENTING ILLNESS:  Kathryn Maddox 71 y.o. female with pmh of morbid obesity, HTN, HLD   I have reviewed her chart and materials related to her cancer extensively and collaborated history with the patient. Summary of oncologic history  is as follows: Oncology History  Endometrial cancer ([place])  07/30/2020 Initial Diagnosis   She was seen by Dr. Leonides Schanz of OBGYN for post menopausal bleeding x 7-8 years.    08/14/2020 Imaging   Pelvic US showed   FINDINGS: Uterus: Measurements: 7.7 x 5.3 x 5.8 cm = volume: 123 mL. No fibroids or other mass visualized. Endometrium: Thickness: 10 mm.  No focal abnormality visualized. Right ovary: Nonvisualized. Left ovary: Nonvisualized. Other findings: No abnormal free fluid.   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 No or minimal myometrial invasion, less than 50%, suggesting stage 1a disease by imaging.  -She was not a surgical candidate due to morbid obesity.  -S/p IUD Mirena by Dr. Leafy Ro 4/22 - Repeat EMBx on 03/24/21 showed persistent grade 1 endometrioid cancer.  - Unable to tolerate Norethidrone and IUD expelled.       Radiation Therapy   - S/p EBRT at [place] followed by 4 fractions of vaginal brachytherapy at [place] completed on 1/51/7616.   - complicated by radiation cystitis and diarrhea. Was seen by Dr. Delaine Lame of ID. Diagnosed with VRE colonization. Symptoms have largely improved since.     01/19/2022 Progression   PET scan performed at [facility]-  IMPRESSION:  1.  Focal area of markedly increased FDG activity in the fundus of the uterus which may represent uterine cancer  2.  Subcentimeter right pelvic and presacral lymph nodes with intense FDG activity concerning for nodal metastases.      MEDICAL HISTORY:  Past Medical History:  Diagnosis Date   Carpal tunnel syndrome on both sides 03/2018   Cellulitis and abscess of lower extremity 03/2018   bilateral lower extrems, treated with antibx x 10 days   Diabetes mellitus without complication ([place])    Dyspnea 03/2018   with any exercise, due to weight   Eczema    Endometrial cancer ([place]) 08/26/2020   Hyperlipidemia    Hypertension    Morbid obesity with BMI of 70 and over, adult ([place]) 03/2018   Sleep apnea 03/2018   uses a cpap    SURGICAL HISTORY: Past Surgical History:  Procedure Laterality Date   CARPAL TUNNEL RELEASE Left 04/12/2018    Procedure: CARPAL TUNNEL RELEASE;  Surgeon: Hessie Knows, MD;  Location: [facility] ORS;  Service: Orthopedics;  Laterality: Left;   COLONOSCOPY WITH PROPOFOL N/A 12/02/2019   Procedure: COLONOSCOPY WITH PROPOFOL;  Surgeon: Toledo, Benay Pike, MD;  Location: [facility] ENDOSCOPY;  Service: Gastroenterology;  Laterality: N/A;   DILATION AND CURETTAGE OF UTERUS  03/2018   has had 10 d & c's with hysteroscopy   HERNIA REPAIR  0737   umbilical w/ obstructed bowel   LIPOMA EXCISION Right 1997   shoulder   obstructive bowel surgery  01/2010    SOCIAL HISTORY: Social History   Socioeconomic History   Marital status: Divorced    Spouse name: Not on file   Number of children: Not on file   Years of education: Not on file   Highest education level: Not on file  Occupational History   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[place], now retired  Tobacco Use   Smoking status: Never   Smokeless tobacco: Never  Vaping Use   Vaping Use: Never used  Substance and Sexual Activity   Alcohol use: Yes    Comment: rarely   Drug use: Never   Sexual activity: Not Currently  Other Topics Concern   Not on file  Social History Narrative   Not on file   Social Determinants of Health   Financial Resource Strain: Not on file  Food Insecurity: Not on file  Transportation Needs: Not on file  Physical Activity: Not on file  Stress: Not on file  Social Connections: Not on file  Intimate Partner Violence: Not on file    FAMILY HISTORY: Family History  Problem Relation Age of Onset   Breast cancer Neg Hx     ALLERGIES:  is allergic to megace [megestrol], penicillins, ibuprofen, and pravastatin.  MEDICATIONS:  Current Outpatient Medications  Medication Sig Dispense Refill   Easy Touch Lancets 30G/Twist MISC      losartan (COZAAR) 50 MG tablet Take 50 mg by mouth daily.     rosuvastatin (CRESTOR) 5 MG tablet Take 5 mg by mouth daily.     tirzepatide (MOUNJARO) 5 MG/0.5ML Pen  INJECT 5 MG UNDER THE SKIN EVERY 7 DAYS     No current facility-administered medications for this visit.    REVIEW OF SYSTEMS:   Pertinent information mentioned in HPI All other systems were reviewed with the patient and are negative.  PHYSICAL EXAMINATION: ECOG PERFORMANCE STATUS: 2 - Symptomatic, <50% confined to bed  Vitals:   02/10/22 1347  BP: (!) 143/84  Pulse: 78  Resp: 18  Temp: 99 F (37.2 C)  SpO2: 96%   Filed Weights   02/10/22 1347  Weight: (!) 355 lb 6.4 oz (161.2 kg)    GENERAL:alert, no distress and comfortable SKIN: skin color, texture, turgor are normal, no rashes or significant lesions EYES: normal, conjunctiva are pink and non-injected, sclera clear OROPHARYNX:no exudate, no erythema and lips, buccal mucosa, and tongue normal  NECK: supple, thyroid normal size, non-tender, without nodularity LYMPH:  no palpable lymphadenopathy in the cervical, axillary or inguinal LUNGS: clear to auscultation and percussion with normal breathing effort HEART: regular rate & rhythm and no murmurs and no lower extremity edema ABDOMEN:abdomen soft, non-tender and normal bowel sounds Musculoskeletal:no cyanosis of digits and no clubbing  PSYCH: alert & oriented x 3 with fluent speech NEURO: no focal motor/sensory deficits  LABORATORY DATA:  I have reviewed the data as listed Lab Results  Component Value Date   WBC 4.0 09/02/2021   HGB 13.1 09/02/2021   HCT 40.5 09/02/2021   MCV 85.4 09/02/2021   PLT 150 09/02/2021     RADIOGRAPHIC STUDIES: I have personally reviewed the radiological images as listed and agreed with the findings in the report. MM 3D SCREEN BREAST BILATERAL  Result Date: 02/10/2022 CLINICAL DATA:  Screening. EXAM: DIGITAL SCREENING BILATERAL MAMMOGRAM WITH TOMOSYNTHESIS AND CAD TECHNIQUE: Bilateral screening digital craniocaudal and mediolateral oblique mammograms were obtained. Bilateral screening digital breast tomosynthesis was performed. The  images were evaluated with computer-aided detection. COMPARISON:  Previous exam(s). ACR Breast Density Category a: The breast tissue is almost entirely fatty. FINDINGS: There are no findings suspicious for malignancy. IMPRESSION: No mammographic evidence of malignancy. A result letter of this screening mammogram will be mailed directly to the patient. RECOMMENDATION: Screening mammogram in one year. (Code:SM-B-01Y) BI-RADS CATEGORY  1: Negative. Electronically Signed   By: Audie Pinto M.D.   On: 02/10/2022 14:16

## 2022-02-10 NOTE — Progress Notes (Signed)
Patient here today for evaluation regarding endometrial cancer.

## 2022-02-11 ENCOUNTER — Encounter: Payer: Self-pay | Admitting: Internal Medicine

## 2022-02-11 ENCOUNTER — Other Ambulatory Visit: Payer: Self-pay

## 2022-02-11 DIAGNOSIS — C541 Malignant neoplasm of endometrium: Secondary | ICD-10-CM

## 2022-02-11 DIAGNOSIS — G4733 Obstructive sleep apnea (adult) (pediatric): Secondary | ICD-10-CM | POA: Diagnosis not present

## 2022-02-11 MED ORDER — DEXAMETHASONE 4 MG PO TABS: ORAL_TABLET | ORAL | 1 refills | Status: DC

## 2022-02-11 MED ORDER — ONDANSETRON HCL 8 MG PO TABS: 8.0000 mg | ORAL_TABLET | Freq: Three times a day (TID) | ORAL | 1 refills | Status: DC | PRN

## 2022-02-11 MED ORDER — PROCHLORPERAZINE MALEATE 10 MG PO TABS: 10.0000 mg | ORAL_TABLET | Freq: Four times a day (QID) | ORAL | 1 refills | Status: DC | PRN

## 2022-02-11 NOTE — Progress Notes (Deleted)
START OFF PATHWAY REGIMEN - Uterine   [id number]:Carboplatin AUC=5 IV D1 + Paclitaxel 175 mg/m2 IV D1 + Pembrolizumab 200 mg IV D1 q21 Days x 6 Cycles Followed by Pembrolizumab 400 mg IV D1 q42 Days:   Cycles 1 through 6: A cycle is every 21 days:     Pembrolizumab      Paclitaxel      Carboplatin    Cycles 7 and beyond: A cycle is every 42 days:     Pembrolizumab   **Always confirm dose/schedule in your pharmacy ordering system**  Patient Characteristics: Endometrioid, Recurrent/Progressive Disease, Second Line, Locoregional Recurrence - No Prior Chemotherapy Histology: Endometrioid Therapeutic Status: Recurrent or Progressive Disease Line of Therapy: Second Line  Intent of Therapy: Non-Curative / Palliative Intent, Discussed with Patient

## 2022-02-11 NOTE — Progress Notes (Deleted)
CLINICAL TRIAL SELECTED - Uterine  Other Clinical Trial: [id number]  **Trial eligibility and accrual should be confirmed by your research team**  Patient Characteristics: Endometrioid, Recurrent/Progressive Disease, Second Line, Locoregional Recurrence - No Prior Chemotherapy Histology: Endometrioid Therapeutic Status: Recurrent or Progressive Disease Line of Therapy: Second Line  Intent of Therapy: Non-Curative / Palliative Intent, Discussed with Patient

## 2022-02-11 NOTE — Progress Notes (Signed)
DISCONTINUE SELECTED CLINICAL TRIAL - Uterine  Other Clinical Trial: [id number]  **Trial eligibility and accrual should be confirmed by your research team**  REASON: Other Reason PRIOR TREATMENT: Other Trial - [id number] TREATMENT RESPONSE: Unable to Evaluate  START ON PATHWAY REGIMEN - Uterine     A cycle is every 21 days:     Paclitaxel      Carboplatin   **Always confirm dose/schedule in your pharmacy ordering system**  Patient Characteristics: Endometrioid, Recurrent/Progressive Disease, Second Line, Locoregional Recurrence - No Prior Chemotherapy Histology: Endometrioid Therapeutic Status: Recurrent or Progressive Disease Line of Therapy: Second Line  Intent of Therapy: Non-Curative / Palliative Intent, Discussed with Patient

## 2022-02-11 NOTE — Progress Notes (Deleted)
DISCONTINUE OFF PATHWAY REGIMEN - Uterine   [id number]:Carboplatin AUC=5 IV D1 + Paclitaxel 175 mg/m2 IV D1 + Pembrolizumab 200 mg IV D1 q21 Days x 6 Cycles Followed by Pembrolizumab 400 mg IV D1 q42 Days:   Cycles 1 through 6: A cycle is every 21 days:     Pembrolizumab      Paclitaxel      Carboplatin    Cycles 7 and beyond: A cycle is every 42 days:     Pembrolizumab   **Always confirm dose/schedule in your pharmacy ordering system**

## 2022-02-12 ENCOUNTER — Other Ambulatory Visit: Payer: Self-pay

## 2022-02-14 DIAGNOSIS — G4733 Obstructive sleep apnea (adult) (pediatric): Secondary | ICD-10-CM | POA: Diagnosis not present

## 2022-02-14 DIAGNOSIS — B369 Superficial mycosis, unspecified: Secondary | ICD-10-CM | POA: Diagnosis not present

## 2022-02-14 DIAGNOSIS — Z0000 Encounter for general adult medical examination without abnormal findings: Secondary | ICD-10-CM | POA: Diagnosis not present

## 2022-02-14 DIAGNOSIS — Z6841 Body Mass Index (BMI) 40.0 and over, adult: Secondary | ICD-10-CM | POA: Diagnosis not present

## 2022-02-14 DIAGNOSIS — I10 Essential (primary) hypertension: Secondary | ICD-10-CM | POA: Diagnosis not present

## 2022-02-14 DIAGNOSIS — E1142 Type 2 diabetes mellitus with diabetic polyneuropathy: Secondary | ICD-10-CM | POA: Diagnosis not present

## 2022-02-14 DIAGNOSIS — E782 Mixed hyperlipidemia: Secondary | ICD-10-CM | POA: Diagnosis not present

## 2022-02-14 DIAGNOSIS — C541 Malignant neoplasm of endometrium: Secondary | ICD-10-CM | POA: Diagnosis not present

## 2022-02-15 ENCOUNTER — Encounter: Payer: Self-pay | Admitting: Internal Medicine

## 2022-02-15 ENCOUNTER — Inpatient Hospital Stay: Payer: HMO

## 2022-02-15 ENCOUNTER — Encounter: Payer: Self-pay | Admitting: *Deleted

## 2022-02-16 ENCOUNTER — Other Ambulatory Visit: Payer: Self-pay | Admitting: Internal Medicine

## 2022-02-16 DIAGNOSIS — C541 Malignant neoplasm of endometrium: Secondary | ICD-10-CM

## 2022-02-16 NOTE — Progress Notes (Signed)
Patient on schedule for Port placement 8/31 , called and spoke with patient on phone with pre procedure instructions given. Made aware to be here at 1230, NPO after 0630 tomorrow am and driver post procedure/recovery/discharge. Stated understanding.

## 2022-02-17 ENCOUNTER — Encounter: Payer: Self-pay | Admitting: Radiology

## 2022-02-17 ENCOUNTER — Ambulatory Visit
Admission: RE | Admit: 2022-02-17 | Discharge: 2022-02-17 | Disposition: A | Discharge status: Home / Self Care | Payer: HMO | Source: Ambulatory Visit | Attending: Internal Medicine | Admitting: Internal Medicine

## 2022-02-17 DIAGNOSIS — C541 Malignant neoplasm of endometrium: Secondary | ICD-10-CM | POA: Insufficient documentation

## 2022-02-17 DIAGNOSIS — E119 Type 2 diabetes mellitus without complications: Secondary | ICD-10-CM | POA: Insufficient documentation

## 2022-02-17 DIAGNOSIS — E785 Hyperlipidemia, unspecified: Secondary | ICD-10-CM | POA: Diagnosis not present

## 2022-02-17 DIAGNOSIS — I10 Essential (primary) hypertension: Secondary | ICD-10-CM | POA: Insufficient documentation

## 2022-02-17 DIAGNOSIS — G4730 Sleep apnea, unspecified: Secondary | ICD-10-CM | POA: Insufficient documentation

## 2022-02-17 DIAGNOSIS — Z452 Encounter for adjustment and management of vascular access device: Secondary | ICD-10-CM | POA: Diagnosis not present

## 2022-02-17 HISTORY — PX: IR IMAGING GUIDED PORT INSERTION: IMG5740

## 2022-02-17 MED ORDER — LIDOCAINE HCL 1 % IJ SOLN
INTRAMUSCULAR | Status: AC
  Administered 2022-02-17: 19 mL
  Filled 2022-02-17: qty 20

## 2022-02-17 MED ORDER — FENTANYL CITRATE (PF) 100 MCG/2ML IJ SOLN
INTRAMUSCULAR | Status: AC | PRN
  Administered 2022-02-17 (×2): 50 ug via INTRAVENOUS

## 2022-02-17 MED ORDER — SODIUM CHLORIDE 0.9 % IV SOLN
INTRAVENOUS | Status: DC
  Filled 2022-02-17: qty 1000

## 2022-02-17 MED ORDER — MIDAZOLAM HCL 2 MG/2ML IJ SOLN
INTRAMUSCULAR | Status: AC
  Filled 2022-02-17: qty 2

## 2022-02-17 MED ORDER — MIDAZOLAM HCL 5 MG/5ML IJ SOLN
INTRAMUSCULAR | Status: AC | PRN
  Administered 2022-02-17 (×2): 1 mg via INTRAVENOUS

## 2022-02-17 MED ORDER — FENTANYL CITRATE (PF) 100 MCG/2ML IJ SOLN
INTRAMUSCULAR | Status: AC
  Filled 2022-02-17: qty 2

## 2022-02-17 MED ORDER — HEPARIN SOD (PORK) LOCK FLUSH 100 UNIT/ML IV SOLN
INTRAVENOUS | Status: AC
  Administered 2022-02-17: 500 [IU]
  Filled 2022-02-17: qty 5

## 2022-02-17 NOTE — Procedures (Signed)
Interventional Radiology Procedure Note  Procedure: Single Lumen Power Port Placement    Access:  Right IJ vein.  Findings: Catheter tip positioned at SVC/RA junction. Port is ready for immediate use.   Complications: None  EBL: < 10 mL  Recommendations:  - Ok to shower in 24 hours - Do not submerge for 7 days - Routine line care   Ernestina Joe T. Danicka Hourihan, M.D Pager:  [phone]   

## 2022-02-17 NOTE — H&P (Signed)
Chief Complaint: Patient was seen in consultation today for endometrial cancer and need for port a catheter for chemotherapy at the request of [place]  Referring Physician(s): Kline  Supervising Physician: Aletta Edouard  Patient Status: [facility] - Out-pt  History of Present Illness: Kathryn Maddox is a 71 y.o. female with PMHx significant for DM, HTN, hyperlipidemia, sleep apnea and endometrial cancer. The patient follows with oncology and has started chemotherapy. Request received for IR port a catheter placement. The patient denies any previous CVC.   The patient denies any current chest pain or shortness of breath. The patient denies any recent infections, fever or chills. The patient does have sleep apnea and uses a CPAP. She has no known complications to sedation.    Past Medical History:  Diagnosis Date   Carpal tunnel syndrome on both sides 03/2018   Cellulitis and abscess of lower extremity 03/2018   bilateral lower extrems, treated with antibx x 10 days   Diabetes mellitus without complication ([place])    Dyspnea 03/2018   with any exercise, due to weight   Eczema    Endometrial cancer ([place]) 08/26/2020   Hyperlipidemia    Hypertension    Morbid obesity with BMI of 70 and over, adult ([place]) 03/2018   Sleep apnea 03/2018   uses a cpap    Past Surgical History:  Procedure Laterality Date   CARPAL TUNNEL RELEASE Left 04/12/2018   Procedure: CARPAL TUNNEL RELEASE;  Surgeon: Hessie Knows, MD;  Location: [facility] ORS;  Service: Orthopedics;  Laterality: Left;   COLONOSCOPY WITH PROPOFOL N/A 12/02/2019   Procedure: COLONOSCOPY WITH PROPOFOL;  Surgeon: Toledo, Benay Pike, MD;  Location: [facility] ENDOSCOPY;  Service: Gastroenterology;  Laterality: N/A;   DILATION AND CURETTAGE OF UTERUS  03/2018   has had 10 d & c's with hysteroscopy   HERNIA REPAIR  [id number]   umbilical w/ obstructed bowel   LIPOMA EXCISION Right 1997   shoulder   obstructive bowel surgery  01/2010     Allergies: Megace [megestrol], Penicillins, Ibuprofen, and Pravastatin  Medications: Prior to Admission medications   Medication Sig Start Date End Date Taking? Authorizing Provider  losartan (COZAAR) 50 MG tablet Take 50 mg by mouth daily.   Yes [provider]  rosuvastatin (CRESTOR) 5 MG tablet Take 5 mg by mouth daily.   Yes [provider]  dexamethasone (DECADRON) 4 MG tablet Take 2 tablets (8 mg total) by mouth daily for 3 days. Start the day after chemotherapy. Take with food. 02/11/22   [name], MD  Easy Touch Lancets 30G/Twist MISC  11/21/20   [provider]  ondansetron (ZOFRAN) 8 MG tablet Take 1 tablet (8 mg total) by mouth every 8 (eight) hours as needed for nausea or vomiting. Start on the third day after chemotherapy. 02/11/22   [name], MD  prochlorperazine (COMPAZINE) 10 MG tablet Take 1 tablet (10 mg total) by mouth every 6 (six) hours as needed for nausea or vomiting. 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[place], now retired  Tobacco Use   Smoking status: Never   Smokeless tobacco: Never  Vaping Use   Vaping Use: Never used  Substance and Sexual Activity   Alcohol use: Yes    Comment: rarely   Drug use: Never   Sexual activity: Not Currently  Other Topics Concern   Not on file  Social History Narrative   Not on file   Social Determinants of Health   Financial Resource Strain: Not on file  Food Insecurity: Not on file  Transportation  Needs: Not on file  Physical Activity: Not on file  Stress: Not on file  Social Connections: Not on file    Review of Systems: A 12 point ROS discussed and pertinent positives are indicated in the HPI above.  All other systems are negative.  Review of Systems  Vital Signs: BP (!) 148/53   Pulse 71   Temp 98.6 F (37 C) (Oral)   Resp 17   Ht '5\' 1"'$  (1.549 m)   Wt (!) 349 lb (158.3 kg)   SpO2 96%   BMI 65.94 kg/m   Physical Exam Constitutional:      General: She is not in acute distress.    Appearance: Normal appearance.  HENT:     Head: Normocephalic and atraumatic.  Cardiovascular:     Rate and Rhythm: Normal rate and regular rhythm.  Pulmonary:     Effort: Pulmonary effort is normal. No respiratory distress.     Breath sounds: Normal breath sounds.  Skin:    General: Skin is warm and dry.  Neurological:     Mental Status: She is alert and oriented to person, place, and time.     Imaging: MM 3D SCREEN BREAST BILATERAL  Result Date: 02/10/2022 CLINICAL DATA:  Screening. EXAM: DIGITAL SCREENING BILATERAL MAMMOGRAM WITH TOMOSYNTHESIS AND CAD TECHNIQUE: Bilateral screening digital craniocaudal and mediolateral oblique mammograms were obtained. Bilateral screening digital breast tomosynthesis was performed. The images were evaluated with computer-aided detection. COMPARISON:  Previous exam(s). ACR Breast Density Category a: The breast tissue is almost entirely fatty. FINDINGS: There are no findings suspicious for malignancy. IMPRESSION: No mammographic evidence of malignancy. A result letter of this screening mammogram will be mailed directly to the patient. RECOMMENDATION: Screening mammogram in one year. (Code:SM-B-01Y) BI-RADS CATEGORY  1: Negative. Electronically Signed   By: Audie Pinto M.D.   On: 02/10/2022 14:16    Labs:  CBC: Recent Labs    08/12/21 1303 08/19/21 1304 08/26/21 1241 09/02/21 1243  WBC 4.3 4.6 3.9* 4.0  HGB 13.3 13.5 13.0 13.1  HCT 41.3  42.4 40.1 40.5  PLT 219 170 152 150    COAGS: No results for input(s): "INR", "APTT" in the last 8760 hours.   Assessment and Plan: This is a 71 year old female with PMHx significant for DM, HTN, hyperlipidemia, sleep apnea and endometrial cancer stage III by imaging. The patient follows with oncology and has started chemotherapy. Request received for IR port a catheter placement. The patient denies any previous CVC.   The patient has been NPO, labs and vitals have been reviewed.  Risks and benefits of image guided port-a-catheter placement was discussed with the patient including, but not limited to bleeding, infection, pneumothorax, or fibrin sheath development and need for additional procedures.  All of the patient's questions were answered, patient is agreeable to proceed. Consent signed and in chart.  Thank you for this interesting consult.  I greatly enjoyed meeting Kathryn Maddox and look forward to participating in their care.  A copy of this report was sent to the requesting provider on this date.  Electronically Signed: Hedy Jacob, PA-C 02/17/2022, 1:29 PM   I spent a total of 15 Minutes in face to face in clinical consultation, greater than 50% of which was counseling/coordinating care for endometrial cancer and need for port a catheter for chemotherapy.

## 2022-02-24 ENCOUNTER — Encounter: Payer: Self-pay | Admitting: Internal Medicine

## 2022-02-24 ENCOUNTER — Other Ambulatory Visit: Payer: HMO

## 2022-02-24 ENCOUNTER — Inpatient Hospital Stay: Payer: HMO

## 2022-02-24 ENCOUNTER — Inpatient Hospital Stay: Payer: HMO | Attending: Obstetrics and Gynecology | Admitting: Internal Medicine

## 2022-02-24 VITALS — BP 114/74 | HR 85 | Temp 99.0°F | Resp 18 | Wt 354.1 lb

## 2022-02-24 VITALS — BP 143/68 | HR 82 | Resp 18

## 2022-02-24 DIAGNOSIS — Z5112 Encounter for antineoplastic immunotherapy: Secondary | ICD-10-CM | POA: Insufficient documentation

## 2022-02-24 DIAGNOSIS — E1140 Type 2 diabetes mellitus with diabetic neuropathy, unspecified: Secondary | ICD-10-CM | POA: Diagnosis not present

## 2022-02-24 DIAGNOSIS — E785 Hyperlipidemia, unspecified: Secondary | ICD-10-CM | POA: Diagnosis not present

## 2022-02-24 DIAGNOSIS — R1032 Left lower quadrant pain: Secondary | ICD-10-CM | POA: Diagnosis not present

## 2022-02-24 DIAGNOSIS — Z5111 Encounter for antineoplastic chemotherapy: Secondary | ICD-10-CM | POA: Diagnosis not present

## 2022-02-24 DIAGNOSIS — C541 Malignant neoplasm of endometrium: Secondary | ICD-10-CM

## 2022-02-24 DIAGNOSIS — R102 Pelvic and perineal pain: Secondary | ICD-10-CM | POA: Insufficient documentation

## 2022-02-24 DIAGNOSIS — I10 Essential (primary) hypertension: Secondary | ICD-10-CM | POA: Diagnosis not present

## 2022-02-24 LAB — COMPREHENSIVE METABOLIC PANEL
ALT: 18 U/L (ref 0–44)
AST: 20 U/L (ref 15–41)
Albumin: 3.6 g/dL (ref 3.5–5.0)
Alkaline Phosphatase: 58 U/L (ref 38–126)
Anion gap: 6 (ref 5–15)
BUN: 14 mg/dL (ref 8–23)
CO2: 25 mmol/L (ref 22–32)
Calcium: 8.8 mg/dL — ABNORMAL LOW (ref 8.9–10.3)
Chloride: 107 mmol/L (ref 98–111)
Creatinine, Ser: 0.6 mg/dL (ref 0.44–1.00)
GFR, Estimated: >60 mL/min (ref >60)
Glucose, Bld: 141 mg/dL — ABNORMAL HIGH (ref 70–99)
Potassium: 3.7 mmol/L (ref 3.5–5.1)
Sodium: 138 mmol/L (ref 135–145)
Total Bilirubin: 0.6 mg/dL (ref 0.3–1.2)
Total Protein: 7.3 g/dL (ref 6.5–8.1)

## 2022-02-24 LAB — CBC WITH DIFFERENTIAL/PLATELET
Abs Immature Granulocytes: 0.02 10*3/uL (ref 0.00–0.07)
Basophils Absolute: 0 10*3/uL (ref 0.0–0.1)
Basophils Relative: 1 %
Eosinophils Absolute: 0.1 10*3/uL (ref 0.0–0.5)
Eosinophils Relative: 2 %
HCT: 36.2 % (ref 36.0–46.0)
Hemoglobin: 11.9 g/dL — ABNORMAL LOW (ref 12.0–15.0)
Immature Granulocytes: 1 %
Lymphocytes Relative: 17 %
Lymphs Abs: 0.7 10*3/uL (ref 0.7–4.0)
MCH: 28.5 pg (ref 26.0–34.0)
MCHC: 32.9 g/dL (ref 30.0–36.0)
MCV: 86.6 fL (ref 80.0–100.0)
Monocytes Absolute: 0.7 10*3/uL (ref 0.1–1.0)
Monocytes Relative: 17 %
Neutro Abs: 2.6 10*3/uL (ref 1.7–7.7)
Neutrophils Relative %: 62 %
Platelets: 190 10*3/uL (ref 150–400)
RBC: 4.18 MIL/uL (ref 3.87–5.11)
RDW: 13.2 % (ref 11.5–15.5)
WBC: 4.1 10*3/uL (ref 4.0–10.5)
nRBC: 0 % (ref 0.0–0.2)

## 2022-02-24 MED ORDER — SODIUM CHLORIDE 0.9 % IV SOLN
Freq: Once | INTRAVENOUS | Status: AC
  Filled 2022-02-24: qty 250

## 2022-02-24 MED ORDER — SODIUM CHLORIDE 0.9 % IV SOLN
600.0000 mg | Freq: Once | INTRAVENOUS | Status: AC
  Administered 2022-02-24: 600 mg via INTRAVENOUS
  Filled 2022-02-24: qty 60

## 2022-02-24 MED ORDER — SODIUM CHLORIDE 0.9 % IV SOLN
135.0000 mg/m2 | Freq: Once | INTRAVENOUS | Status: AC
  Administered 2022-02-24: 354 mg via INTRAVENOUS
  Filled 2022-02-24: qty 59

## 2022-02-24 MED ORDER — LIDOCAINE-PRILOCAINE 2.5-2.5 % EX CREA: 1.0000 | TOPICAL_CREAM | CUTANEOUS | 2 refills | Status: DC | PRN

## 2022-02-24 MED ORDER — SODIUM CHLORIDE 0.9 % IV SOLN
10.0000 mg | Freq: Once | INTRAVENOUS | Status: AC
  Administered 2022-02-24: 10 mg via INTRAVENOUS
  Filled 2022-02-24: qty 10

## 2022-02-24 MED ORDER — SODIUM CHLORIDE 0.9 % IV SOLN
150.0000 mg | Freq: Once | INTRAVENOUS | Status: AC
  Administered 2022-02-24: 150 mg via INTRAVENOUS
  Filled 2022-02-24: qty 150

## 2022-02-24 MED ORDER — DIPHENHYDRAMINE HCL 50 MG/ML IJ SOLN
50.0000 mg | Freq: Once | INTRAMUSCULAR | Status: AC
  Administered 2022-02-24: 50 mg via INTRAVENOUS
  Filled 2022-02-24: qty 1

## 2022-02-24 MED ORDER — MORPHINE SULFATE 15 MG PO TABS: 15.0000 mg | ORAL_TABLET | Freq: Four times a day (QID) | ORAL | 0 refills | Status: AC | PRN

## 2022-02-24 MED ORDER — HEPARIN SOD (PORK) LOCK FLUSH 100 UNIT/ML IV SOLN
500.0000 [IU] | Freq: Once | INTRAVENOUS | Status: AC | PRN
  Administered 2022-02-24: 500 [IU]
  Filled 2022-02-24: qty 5

## 2022-02-24 MED ORDER — PALONOSETRON HCL INJECTION 0.25 MG/5ML
0.2500 mg | Freq: Once | INTRAVENOUS | Status: AC
  Administered 2022-02-24: 0.25 mg via INTRAVENOUS
  Filled 2022-02-24: qty 5

## 2022-02-24 MED ORDER — FAMOTIDINE IN NACL 20-0.9 MG/50ML-% IV SOLN
20.0000 mg | Freq: Once | INTRAVENOUS | Status: AC
  Administered 2022-02-24: 20 mg via INTRAVENOUS
  Filled 2022-02-24: qty 50

## 2022-02-24 NOTE — Patient Instructions (Signed)
[facility]  Discharge Instructions: Thank you for choosing [facility] to provide your oncology and hematology care.  If you have a lab appointment with the Cancer Center, please go directly to the Cancer Center and check in at the registration area.  Wear comfortable clothing and clothing appropriate for easy access to any Portacath or PICC line.   We strive to give you quality time with your provider. You may need to reschedule your appointment if you arrive late (15 or more minutes).  Arriving late affects you and other patients whose appointments are after yours.  Also, if you miss three or more appointments without notifying the office, you may be dismissed from the clinic at the provider's discretion.      For prescription refill requests, have your pharmacy contact our office and allow 72 hours for refills to be completed.    Today you received the following chemotherapy and/or immunotherapy agents TAXOL and CARBOPLATIN      To help prevent nausea and vomiting after your treatment, we encourage you to take your nausea medication as directed.  BELOW ARE SYMPTOMS THAT SHOULD BE REPORTED IMMEDIATELY: *FEVER GREATER THAN 100.4 F (38 C) OR HIGHER *CHILLS OR SWEATING *NAUSEA AND VOMITING THAT IS NOT CONTROLLED WITH YOUR NAUSEA MEDICATION *UNUSUAL SHORTNESS OF BREATH *UNUSUAL BRUISING OR BLEEDING *URINARY PROBLEMS (pain or burning when urinating, or frequent urination) *BOWEL PROBLEMS (unusual diarrhea, constipation, pain near the anus) TENDERNESS IN MOUTH AND THROAT WITH OR WITHOUT PRESENCE OF ULCERS (sore throat, sores in mouth, or a toothache) UNUSUAL RASH, SWELLING OR PAIN  UNUSUAL VAGINAL DISCHARGE OR ITCHING   Items with * indicate a potential emergency and should be followed up as soon as possible or go to the Emergency Department if any problems should occur.  Please show the CHEMOTHERAPY ALERT CARD or IMMUNOTHERAPY ALERT CARD at  check-in to the Emergency Department and triage nurse.  Should you have questions after your visit or need to cancel or reschedule your appointment, please contact [facility]  [phone] and follow the prompts.  Office hours are 8:00 a.m. to 4:30 p.m. Monday - Friday. Please note that voicemails left after 4:00 p.m. may not be returned until the following business day.  We are closed weekends and major holidays. You have access to a nurse at all times for urgent questions. Please call the main number to the clinic [phone] and follow the prompts.  For any non-urgent questions, you may also contact your provider using MyChart. We now offer e-Visits for anyone 18 and older to request care online for non-urgent symptoms. For details visit [email or web address].   Also download the MyChart app! Go to the app store, search "MyChart", open the app, select [facility], and log in with your MyChart username and password.  Masks are optional in the cancer centers. If you would like for your care team to wear a mask while they are taking care of you, please let them know. For doctor visits, patients may have with them one support person who is at least 71 years old. At this time, visitors are not allowed in the infusion area.   Paclitaxel Injection What is this medication? PACLITAXEL (PAK li TAX el) treats some types of cancer. It works by slowing down the growth of cancer cells. This medicine may be used for other purposes; ask your health care provider or pharmacist if you have questions. COMMON BRAND NAME(S): Onxol, Taxol What should I   tell my care team before I take this medication? They need to know if you have any of these conditions: Heart disease Liver disease Low white blood cell levels An unusual or allergic reaction to paclitaxel, other medications, foods, dyes, or preservatives If you or your partner are pregnant or trying to get  pregnant Breast-feeding How should I use this medication? This medication is injected into a vein. It is given by your care team in a hospital or clinic setting. Talk to your care team about the use of this medication in children. While it may be given to children for selected conditions, precautions do apply. Overdosage: If you think you have taken too much of this medicine contact a poison control center or emergency room at once. NOTE: This medicine is only for you. Do not share this medicine with others. What if I miss a dose? Keep appointments for follow-up doses. It is important not to miss your dose. Call your care team if you are unable to keep an appointment. What may interact with this medication? Do not take this medication with any of the following: Live virus vaccines Other medications may affect the way this medication works. Talk with your care team about all of the medications you take. They may suggest changes to your treatment plan to lower the risk of side effects and to make sure your medications work as intended. This list may not describe all possible interactions. Give your health care provider a list of all the medicines, herbs, non-prescription drugs, or dietary supplements you use. Also tell them if you smoke, drink alcohol, or use illegal drugs. Some items may interact with your medicine. What should I watch for while using this medication? Your condition will be monitored carefully while you are receiving this medication. You may need blood work while taking this medication. This medication may make you feel generally unwell. This is not uncommon as chemotherapy can affect healthy cells as well as cancer cells. Report any side effects. Continue your course of treatment even though you feel ill unless your care team tells you to stop. This medication can cause serious allergic reactions. To reduce the risk, your care team may give you other medications to take before  receiving this one. Be sure to follow the directions from your care team. This medication may increase your risk of getting an infection. Call your care team for advice if you get a fever, chills, sore throat, or other symptoms of a cold or flu. Do not treat yourself. Try to avoid being around people who are sick. This medication may increase your risk to bruise or bleed. Call your care team if you notice any unusual bleeding. Be careful brushing or flossing your teeth or using a toothpick because you may get an infection or bleed more easily. If you have any dental work done, tell your dentist you are receiving this medication. Talk to your care team if you may be pregnant. Serious birth defects can occur if you take this medication during pregnancy. Talk to your care team before breastfeeding. Changes to your treatment plan may be needed. What side effects may I notice from receiving this medication? Side effects that you should report to your care team as soon as possible: Allergic reactions--skin rash, itching, hives, swelling of the face, lips, tongue, or throat Heart rhythm changes--fast or irregular heartbeat, dizziness, feeling faint or lightheaded, chest pain, trouble breathing Increase in blood pressure Infection--fever, chills, cough, sore throat, wounds that don't heal, pain   or trouble when passing urine, general feeling of discomfort or being unwell Low blood pressure--dizziness, feeling faint or lightheaded, blurry vision Low red blood cell level--unusual weakness or fatigue, dizziness, headache, trouble breathing Painful swelling, warmth, or redness of the skin, blisters or sores at the infusion site Pain, tingling, or numbness in the hands or feet Slow heartbeat--dizziness, feeling faint or lightheaded, confusion, trouble breathing, unusual weakness or fatigue Unusual bruising or bleeding Side effects that usually do not require medical attention (report to your care team if they  continue or are bothersome): Diarrhea Hair loss Joint pain Loss of appetite Muscle pain Nausea Vomiting This list may not describe all possible side effects. Call your doctor for medical advice about side effects. You may report side effects to FDA at 1-800-FDA-1088. Where should I keep my medication? This medication is given in a hospital or clinic. It will not be stored at home. NOTE: This sheet is a summary. It may not cover all possible information. If you have questions about this medicine, talk to your doctor, pharmacist, or health care provider.  2023 Elsevier/Gold Standard (2021-10-21 00:00:00)  Carboplatin Injection What is this medication? CARBOPLATIN (KAR boe pla tin) treats some types of cancer. It works by slowing down the growth of cancer cells. This medicine may be used for other purposes; ask your health care provider or pharmacist if you have questions. COMMON BRAND NAME(S): Paraplatin What should I tell my care team before I take this medication? They need to know if you have any of these conditions: Blood disorders Hearing problems Kidney disease Recent or ongoing radiation therapy An unusual or allergic reaction to carboplatin, cisplatin, other medications, foods, dyes, or preservatives Pregnant or trying to get pregnant Breast-feeding How should I use this medication? This medication is injected into a vein. It is given by your care team in a hospital or clinic setting. Talk to your care team about the use of this medication in children. Special care may be needed. Overdosage: If you think you have taken too much of this medicine contact a poison control center or emergency room at once. NOTE: This medicine is only for you. Do not share this medicine with others. What if I miss a dose? Keep appointments for follow-up doses. It is important not to miss your dose. Call your care team if you are unable to keep an appointment. What may interact with this  medication? Medications for seizures Some antibiotics, such as amikacin, gentamicin, neomycin, streptomycin, tobramycin Vaccines This list may not describe all possible interactions. Give your health care provider a list of all the medicines, herbs, non-prescription drugs, or dietary supplements you use. Also tell them if you smoke, drink alcohol, or use illegal drugs. Some items may interact with your medicine. What should I watch for while using this medication? Your condition will be monitored carefully while you are receiving this medication. You may need blood work while taking this medication. This medication may make you feel generally unwell. This is not uncommon, as chemotherapy can affect healthy cells as well as cancer cells. Report any side effects. Continue your course of treatment even though you feel ill unless your care team tells you to stop. In some cases, you may be given additional medications to help with side effects. Follow all directions for their use. This medication may increase your risk of getting an infection. Call your care team for advice if you get a fever, chills, sore throat, or other symptoms of a cold or flu.   Do not treat yourself. Try to avoid being around people who are sick. Avoid taking medications that contain aspirin, acetaminophen, ibuprofen, naproxen, or ketoprofen unless instructed by your care team. These medications may hide a fever. Be careful brushing or flossing your teeth or using a toothpick because you may get an infection or bleed more easily. If you have any dental work done, tell your dentist you are receiving this medication. Talk to your care team if you wish to become pregnant or think you might be pregnant. This medication can cause serious birth defects. Talk to your care team about effective forms of contraception. Do not breast-feed while taking this medication. What side effects may I notice from receiving this medication? Side effects  that you should report to your care team as soon as possible: Allergic reactions--skin rash, itching, hives, swelling of the face, lips, tongue, or throat Infection--fever, chills, cough, sore throat, wounds that don't heal, pain or trouble when passing urine, general feeling of discomfort or being unwell Low red blood cell level--unusual weakness or fatigue, dizziness, headache, trouble breathing Pain, tingling, or numbness in the hands or feet, muscle weakness, change in vision, confusion or trouble speaking, loss of balance or coordination, trouble walking, seizures Unusual bruising or bleeding Side effects that usually do not require medical attention (report to your care team if they continue or are bothersome): Hair loss Nausea Unusual weakness or fatigue Vomiting This list may not describe all possible side effects. Call your doctor for medical advice about side effects. You may report side effects to FDA at 1-800-FDA-1088. Where should I keep my medication? This medication is given in a hospital or clinic. It will not be stored at home. NOTE: This sheet is a summary. It may not cover all possible information. If you have questions about this medicine, talk to your doctor, pharmacist, or health care provider.  2023 Elsevier/Gold Standard (2021-09-28 00:00:00)    

## 2022-02-24 NOTE — Progress Notes (Signed)
Patient here today for follow up and treatment consideration regarding endometrial cancer. Patient reports worsening pelvic pain and abdominal pain after eating and with bowel movements.

## 2022-02-24 NOTE — Progress Notes (Addendum)
[place] OFFICE PROGRESS NOTE  Patient Care Team: Sofie Hartigan, MD as PCP - General (Family Medicine) Clent Jacks, RN as Oncology Nurse Navigator Riddle, [place], MD as Referring Physician (Gynecologic Oncology) Noreene Filbert, MD as Consulting Physician (Radiation Oncology) Shirline Frees, MD as Referring Physician (Radiation Oncology) Lonia Farber, MD as Consulting Physician (Endocrinology)  ASSESSMENT & PLAN:  Kathryn Maddox is a 71 yo F with pmh of morbid obesity, HTN and HLD was diagnosed with endometrioid cancer, grade 1, staged 1A per imaging in Feb 2022, referred to medical oncology to discuss about systemic treatment options upon progression.    #Endometrioid cancer, Stage III per imaging, pMMR and p53 wildtype - diagnosed with stage 1A grade 1 endometrioid cancer in Feb 2022 after post menopausal bleeding.  - not a surgical candidate due to morbid obesity  - s/p Mirena IUD with persistance of disease evident on repeat EMBx done in 10/22  - could not tolerate Norethindrone  - S/p EBRT at [facility] followed by 4 fractions of vaginal brachytherapy at [facility] completed on 10/04/2021.  - PET/CT from 01/19/2022 showed focal area of increased FDG activity in fundus of the uterus and right pelvic and presacral lymph nodes concerning for nodal metastases.    -Today, we will proceed with cycle 1 of carboplatin and Taxol.  Considering her age and functional status, I will dose reduce carboplatin to AUC 4 and paclitaxel 135 mg/m2. Patient has preexisting long standing neuropathy from diabetes.  We will closely monitor.  She was able to buy cryotherapy mittens and socks which she will use during chemotherapy.  I discussed with her again about adding immunotherapy considering we are dose limited for chemotherapy due to her functional status and neuropathy and also surgery has not been a feasible option.  Discussed the side effects including inflammation of  various organs such as thyroid, lung, colon, liver and cause symptoms such as shortness of breath, cough, diarrhea, abdominal pain or skin rash.  Patient was agreeable.  We will do pembrolizumab 200 mg every 3 weeks with chemotherapy then start her on maintenance every 6 weeks for up to 2 years.  -Has antiemetics.  Instructions were given.  Emla cream prescription was sent.  -She has PET scheduled in November 2023 with a follow-up visit with Dr. Theora Gianotti.  #Abdominal pelvic pain,  -progressive. Could be related to underlying cancer. -She is using intermittent Motrin and Tylenol.  Sent a prescription MSIR 15 mg every 6 as needed due to increasing pain.  Counseled her about managing constipation if she develops with morphine.  Denies any signs of obstruction.  RTC in 1 week for MD visit, possible hydration, port labs   Orders Placed This Encounter  Procedures   CBC with Differential    Standing Status:   Future    Number of Occurrences:   1    Standing Expiration Date:   02/25/2023   Comprehensive metabolic panel    Standing Status:   Future    Number of Occurrences:   1    Standing Expiration Date:   02/25/2023    All questions were answered. The patient knows to call the clinic with any problems, questions or concerns. The total time spent in the appointment was 40 minutes encounter with patients including review of chart and various tests results, discussions about plan of care and coordination of care plan   [name], MD 02/24/2022 9:26 AM  INTERVAL HISTORY: Please see below for problem oriented  charting. Patient seen today prior to starting cycle 1 of carbo/taxol.  She is understandably anxious about the whole process.  She reports increasing pain in the left lower abdomen which she thinks is coming from the intestine.  Reports has been present for few weeks but is progressively increasing.  Reports regular bowel movement.  Her appetite is fair.  Reports some weight loss.   REVIEW  OF SYSTEMS:   Pertinent positive ROS as above. All other systems were reviewed with the patient and are negative.  I have reviewed the past medical history, past surgical history, social history and family history with the patient and they are unchanged from previous note.  ALLERGIES:  is allergic to megace [megestrol], penicillins, ibuprofen, and pravastatin.  MEDICATIONS:  Current Outpatient Medications  Medication Sig Dispense Refill   dexamethasone (DECADRON) 4 MG tablet Take 2 tablets (8 mg total) by mouth daily for 3 days. Start the day after chemotherapy. Take with food. 30 tablet 1   Easy Touch Lancets 30G/Twist MISC      losartan (COZAAR) 50 MG tablet Take 50 mg by mouth daily.     ondansetron (ZOFRAN) 8 MG tablet Take 1 tablet (8 mg total) by mouth every 8 (eight) hours as needed for nausea or vomiting. Start on the third day after chemotherapy. 30 tablet 1   prochlorperazine (COMPAZINE) 10 MG tablet Take 1 tablet (10 mg total) by mouth every 6 (six) hours as needed for nausea or vomiting. 30 tablet 1   tirzepatide (MOUNJARO) 5 MG/0.5ML Pen INJECT 5 MG UNDER THE SKIN EVERY 7 DAYS     No current facility-administered medications for this visit.    SUMMARY OF ONCOLOGIC HISTORY: Oncology History  Endometrial cancer ([place])  07/30/2020 Initial Diagnosis   She was seen by Dr. Leonides Schanz of OBGYN for post menopausal bleeding x 7-8 years.    08/14/2020 Imaging   Pelvic US showed  FINDINGS: Uterus: Measurements: 7.7 x 5.3 x 5.8 cm = volume: 123 mL. No fibroids or other mass visualized. Endometrium: Thickness: 10 mm.  No focal abnormality visualized. Right ovary: Nonvisualized. Left ovary: Nonvisualized. Other findings: No abnormal free fluid.   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 No or minimal myometrial invasion, less than 50%, suggesting stage 1a disease by imaging.  -She was not a surgical candidate due  to morbid obesity.  -S/p IUD Mirena by Dr. Leafy Ro 4/22 - Repeat EMBx on 03/24/21 showed persistent grade 1 endometrioid cancer.  - Unable to tolerate Norethidrone and IUD expelled.       Radiation Therapy   - S/p EBRT at [facility] followed by 4 fractions of vaginal brachytherapy at [facility] completed on 8/84/1660.   - complicated by radiation cystitis and diarrhea. Was seen by Dr. Delaine Lame of ID. Diagnosed with VRE colonization. Symptoms have largely improved since.     01/19/2022 Progression   PET scan performed at [facility]-  IMPRESSION:  1.  Focal area of markedly increased FDG activity in the fundus of the uterus which may represent uterine cancer  2.  Subcentimeter right pelvic and presacral lymph nodes with intense FDG activity concerning for nodal metastases.    02/24/2022 -  Chemotherapy   Patient is on Treatment Plan :  Carboplatin + Paclitaxel q21d       PHYSICAL EXAMINATION: ECOG PERFORMANCE STATUS: 2 - Symptomatic, <50% confined to bed  Vitals:   02/24/22 0919  Resp: 18   Filed Weights  02/24/22 0919  Weight: (!) 354 lb 1.6 oz (160.6 kg)    GENERAL:alert, no distress and comfortable SKIN: skin color, texture, turgor are normal, no rashes or significant lesions EYES: normal, Conjunctiva are pink and non-injected, sclera clear OROPHARYNX:no exudate, no erythema and lips, buccal mucosa, and tongue normal  NECK: supple, thyroid normal size, non-tender, without nodularity LYMPH:  no palpable lymphadenopathy in the cervical, axillary or inguinal LUNGS: clear to auscultation and percussion with normal breathing effort HEART: regular rate & rhythm and no murmurs and no lower extremity edema ABDOMEN:abdomen soft, non-tender and normal bowel sounds Musculoskeletal:no cyanosis of digits and no clubbing  NEURO: alert & oriented x 3 with fluent speech, no focal motor/sensory deficits  LABORATORY DATA:  I have reviewed the data as listed    Component Value Date/Time   NA 138  02/24/2022 0846   K 3.7 02/24/2022 0846   CL 107 02/24/2022 0846   CO2 25 02/24/2022 0846   GLUCOSE 141 (H) 02/24/2022 0846   BUN 14 02/24/2022 0846   CREATININE 0.60 02/24/2022 0846   CALCIUM 8.8 (L) 02/24/2022 0846   PROT 7.3 02/24/2022 0846   ALBUMIN 3.6 02/24/2022 0846   AST 20 02/24/2022 0846   ALT 18 02/24/2022 0846   ALKPHOS 58 02/24/2022 0846   BILITOT 0.6 02/24/2022 0846   GFRNONAA >60 02/24/2022 0846    No results found for: "SPEP", "UPEP"  Lab Results  Component Value Date   WBC 4.1 02/24/2022   NEUTROABS 2.6 02/24/2022   HGB 11.9 (L) 02/24/2022   HCT 36.2 02/24/2022   MCV 86.6 02/24/2022   PLT 190 02/24/2022      Chemistry      Component Value Date/Time   NA 138 02/24/2022 0846   K 3.7 02/24/2022 0846   CL 107 02/24/2022 0846   CO2 25 02/24/2022 0846   BUN 14 02/24/2022 0846   CREATININE 0.60 02/24/2022 0846      Component Value Date/Time   CALCIUM 8.8 (L) 02/24/2022 0846   ALKPHOS 58 02/24/2022 0846   AST 20 02/24/2022 0846   ALT 18 02/24/2022 0846   BILITOT 0.6 02/24/2022 0846       RADIOGRAPHIC STUDIES: I have personally reviewed the radiological images as listed and agreed with the findings in the report. IR IMAGING GUIDED PORT INSERTION  Result Date: 02/17/2022 CLINICAL DATA:  Endometrial carcinoma and need for porta cath for chemotherapy. EXAM: IMPLANTED PORT A CATH PLACEMENT WITH ULTRASOUND AND FLUOROSCOPIC GUIDANCE ANESTHESIA/SEDATION: Moderate (conscious) sedation was employed during this procedure. A total of Versed 2.0 mg and Fentanyl 100 mcg was administered intravenously by radiology nursing. Moderate Sedation Time: 22 minutes. The patient's level of consciousness and vital signs were monitored continuously by radiology nursing throughout the procedure under my direct supervision. FLUOROSCOPY: 48 seconds.  14.1 mGy. PROCEDURE: The procedure, risks, benefits, and alternatives were explained to the patient. Questions regarding the procedure  were encouraged and answered. The patient understands and consents to the procedure. A time-out was performed prior to initiating the procedure. Ultrasound was utilized to confirm patency of the right internal jugular vein. A permanent ultrasound image was recorded. The right neck and chest were prepped with chlorhexidine in a sterile fashion, and a sterile drape was applied covering the operative field. Maximum barrier sterile technique with sterile gowns and gloves were used for the procedure. Local anesthesia was provided with 1% lidocaine. After creating a small venotomy incision, a 21 gauge needle was advanced into the right internal jugular vein  under direct, real-time ultrasound guidance. Ultrasound image documentation was performed. After securing guidewire access, an 8 Fr dilator was placed. A J-wire was kinked to measure appropriate catheter length. A subcutaneous port pocket was then created along the upper chest wall utilizing sharp and blunt dissection. Portable cautery was utilized. The pocket was irrigated with sterile saline. A single lumen power injectable port was chosen for placement. The 8 Fr catheter was tunneled from the port pocket site to the venotomy incision. The port was placed in the pocket. External catheter was trimmed to appropriate length based on guidewire measurement. At the venotomy, an 8 Fr peel-away sheath was placed over a guidewire. The catheter was then placed through the sheath and the sheath removed. Final catheter positioning was confirmed and documented with a fluoroscopic spot image. The port was accessed with a needle and aspirated and flushed with heparinized saline. The access needle was removed. The venotomy and port pocket incisions were closed with subcutaneous 3-0 Monocryl and subcuticular 4-0 Vicryl. Dermabond was applied to both incisions. COMPLICATIONS: COMPLICATIONS None FINDINGS: After catheter placement, the tip lies at the cavo-atrial junction. The catheter  aspirates normally and is ready for immediate use. IMPRESSION: Placement of single lumen port a cath via right internal jugular vein. The catheter tip lies at the cavo-atrial junction. A power injectable port a cath was placed and is ready for immediate use. Electronically Signed   By: Aletta Edouard M.D.   On: 02/17/2022 15:09   MM 3D SCREEN BREAST BILATERAL  Result Date: 02/10/2022 CLINICAL DATA:  Screening. EXAM: DIGITAL SCREENING BILATERAL MAMMOGRAM WITH TOMOSYNTHESIS AND CAD TECHNIQUE: Bilateral screening digital craniocaudal and mediolateral oblique mammograms were obtained. Bilateral screening digital breast tomosynthesis was performed. The images were evaluated with computer-aided detection. COMPARISON:  Previous exam(s). ACR Breast Density Category a: The breast tissue is almost entirely fatty. FINDINGS: There are no findings suspicious for malignancy. IMPRESSION: No mammographic evidence of malignancy. A result letter of this screening mammogram will be mailed directly to the patient. RECOMMENDATION: Screening mammogram in one year. (Code:SM-B-01Y) BI-RADS CATEGORY  1: Negative. Electronically Signed   By: Audie Pinto M.D.   On: 02/10/2022 14:16

## 2022-02-25 ENCOUNTER — Telehealth: Payer: Self-pay

## 2022-02-25 NOTE — Telephone Encounter (Signed)
Telephone call to patient for follow up after receiving first infusion.   Patient states infusion went great.  States eating good and drinking plenty of fluids.   Denies any nausea or vomiting.  Encouraged patient to call for any concerns or questions. 

## 2022-02-28 ENCOUNTER — Encounter: Payer: Self-pay | Admitting: Urology

## 2022-02-28 ENCOUNTER — Ambulatory Visit (INDEPENDENT_AMBULATORY_CARE_PROVIDER_SITE_OTHER): Payer: HMO | Admitting: Urology

## 2022-02-28 VITALS — BP 145/74 | HR 66 | Ht 61.0 in | Wt 354.0 lb

## 2022-02-28 DIAGNOSIS — N3000 Acute cystitis without hematuria: Secondary | ICD-10-CM

## 2022-02-28 DIAGNOSIS — R350 Frequency of micturition: Secondary | ICD-10-CM

## 2022-02-28 LAB — MICROSCOPIC EXAMINATION

## 2022-02-28 LAB — URINALYSIS, COMPLETE
Bilirubin, UA: NEGATIVE
Glucose, UA: NEGATIVE
Ketones, UA: NEGATIVE
Leukocytes,UA: NEGATIVE
Nitrite, UA: NEGATIVE
RBC, UA: NEGATIVE
Specific Gravity, UA: 1.03 (ref 1.005–1.030)
Urobilinogen, Ur: 0.2 mg/dL (ref 0.2–1.0)
pH, UA: 5 (ref 5.0–7.5)

## 2022-02-28 NOTE — Addendum Note (Signed)
Addended by: Despina Hidden on: 02/28/2022 01:36 PM   Modules accepted: Orders

## 2022-02-28 NOTE — Progress Notes (Signed)
02/28/2022 1:18 PM   Kathryn Maddox 1950/12/29 [id number]  Referring provider: Gillis Ends, MD [facility] [place],  [place] [address]  Chief Complaint  Patient presents with   New Patient (Initial Visit)   Urinary Frequency    HPI: I was consulted to assess the patient's recent bladder infection.  She is undergoing chemotherapy and radiation for endometrial carcinoma.  She was having frequency and burning.  She saw infectious disease and apparently had Enterococcus.  Symptoms settled down but now her bladder is little bit more urgent than baseline and she wonders if it is the chemotherapy.  She is starting to leak minimally and is normally continent  At baseline she voids every 3 hours gets up twice a night.  She is on injections for diabetes.  She is morbidly obese.  No hysterectomy  No bladder surgery kidney stones   PMH: Past Medical History:  Diagnosis Date   Carpal tunnel syndrome on both sides 03/2018   Cellulitis and abscess of lower extremity 03/2018   bilateral lower extrems, treated with antibx x 10 days   Diabetes mellitus without complication ([place])    Dyspnea 03/2018   with any exercise, due to weight   Eczema    Endometrial cancer ([place]) 08/26/2020   Hyperlipidemia    Hypertension    Morbid obesity with BMI of 70 and over, adult ([place]) 03/2018   Sleep apnea 03/2018   uses a cpap    Surgical History: Past Surgical History:  Procedure Laterality Date   CARPAL TUNNEL RELEASE Left 04/12/2018   Procedure: CARPAL TUNNEL RELEASE;  Surgeon: Hessie Knows, MD;  Location: [facility] ORS;  Service: Orthopedics;  Laterality: Left;   COLONOSCOPY WITH PROPOFOL N/A 12/02/2019   Procedure: COLONOSCOPY WITH PROPOFOL;  Surgeon: Toledo, Benay Pike, MD;  Location: [facility] ENDOSCOPY;  Service: Gastroenterology;  Laterality: N/A;   DILATION AND CURETTAGE OF UTERUS  03/2018   has had 10 d & c's with hysteroscopy   HERNIA REPAIR  0086   umbilical w/  obstructed bowel   IR IMAGING GUIDED PORT INSERTION  02/17/2022   LIPOMA EXCISION Right 1997   shoulder   obstructive bowel surgery  01/2010    Home Medications:  Allergies as of 02/28/2022       Reactions   Megace [megestrol] Rash   Penicillins Rash, Other (See Comments)   Has patient had a PCN reaction causing immediate rash, facial/tongue/throat swelling, SOB or lightheadedness with hypotension: Unknown Has patient had a PCN reaction causing severe rash involving mucus membranes or skin necrosis: Unknown Has patient had a PCN reaction that required hospitalization: Unknown Has patient had a PCN reaction occurring within the last 10 years: No If all of the above answers are "NO", then may proceed with Cephalosporin use.   Ibuprofen Hypertension   Pravastatin Other (See Comments)   Muscle pain, extreme constipation        Medication List        Accurate as of February 28, 2022  1:18 PM. If you have any questions, ask your nurse or doctor.          dexamethasone 4 MG tablet Commonly known as: DECADRON Take 2 tablets (8 mg total) by mouth daily for 3 days. Start the day after chemotherapy. Take with food.   Easy Touch Lancets 30G/Twist Misc   lidocaine-prilocaine cream Commonly known as: EMLA Apply 1 Application topically as needed. Apply to port 1 hour prior to chemotherapy. Cover with plastic wrap.  losartan 50 MG tablet Commonly known as: COZAAR Take 50 mg by mouth daily.   morphine 15 MG tablet Commonly known as: MSIR Take 1 tablet (15 mg total) by mouth every 6 (six) hours as needed for severe pain.   Mounjaro 5 MG/0.5ML Pen Generic drug: tirzepatide INJECT 5 MG UNDER THE SKIN EVERY 7 DAYS   ondansetron 8 MG tablet Commonly known as: Zofran Take 1 tablet (8 mg total) by mouth every 8 (eight) hours as needed for nausea or vomiting. Start on the third day after chemotherapy.   prochlorperazine 10 MG tablet Commonly known as: COMPAZINE Take 1 tablet (10  mg total) by mouth every 6 (six) hours as needed for nausea or vomiting.        Allergies:  Allergies  Allergen Reactions   Megace [Megestrol] Rash   Penicillins Rash and Other (See Comments)    Has patient had a PCN reaction causing immediate rash, facial/tongue/throat swelling, SOB or lightheadedness with hypotension: Unknown Has patient had a PCN reaction causing severe rash involving mucus membranes or skin necrosis: Unknown Has patient had a PCN reaction that required hospitalization: Unknown Has patient had a PCN reaction occurring within the last 10 years: No If all of the above answers are "NO", then may proceed with Cephalosporin use.    Ibuprofen Hypertension   Pravastatin Other (See Comments)    Muscle pain, extreme constipation    Family History: Family History  Problem Relation Age of Onset   Breast cancer Neg Hx     Social History:  reports that she has never smoked. She has never been exposed to tobacco smoke. She has never used smokeless tobacco. She reports current alcohol use. She reports that she does not use drugs.  ROS:                                        Physical Exam: BP (!) 145/74   Pulse 66   Ht '5\' 1"'$  (1.549 m)   Wt (!) 160.6 kg   BMI 66.89 kg/m   Constitutional:  Alert and oriented, No acute distress. HEENT: [place] AT, moist mucus membranes.  Trachea midline, no masses. Cardiovascular: No clubbing, cyanosis, or edema.   Laboratory Data: Lab Results  Component Value Date   WBC 4.1 02/24/2022   HGB 11.9 (L) 02/24/2022   HCT 36.2 02/24/2022   MCV 86.6 02/24/2022   PLT 190 02/24/2022    Lab Results  Component Value Date   CREATININE 0.60 02/24/2022    No results found for: "PSA"  No results found for: "TESTOSTERONE"  No results found for: "HGBA1C"  Urinalysis No results found for: "COLORURINE", "APPEARANCEUR", "LABSPEC", "PHURINE", "GLUCOSEU", "HGBUR", "BILIRUBINUR", "KETONESUR", "PROTEINUR",  "UROBILINOGEN", "NITRITE", "LEUKOCYTESUR"  Pertinent Imaging: Urine cath reviewed.  Cath sent for culture.  Chart reviewed  Assessment & Plan: I recommended for [place] to get a cath urine culture and call if positive.  Otherwise see patient as needed.  Reassurance given.  Treat infections as needed  1. Urinary frequency  - Urinalysis, Complete   No follow-ups on file.  Reece Packer, MD  [facility] [address], [place], [place] [address] [phone]

## 2022-03-02 ENCOUNTER — Inpatient Hospital Stay (HOSPITAL_BASED_OUTPATIENT_CLINIC_OR_DEPARTMENT_OTHER): Payer: HMO | Admitting: Internal Medicine

## 2022-03-02 ENCOUNTER — Encounter: Payer: Self-pay | Admitting: Internal Medicine

## 2022-03-02 ENCOUNTER — Inpatient Hospital Stay: Payer: HMO

## 2022-03-02 VITALS — BP 139/86 | HR 89 | Temp 99.1°F | Resp 18

## 2022-03-02 DIAGNOSIS — C541 Malignant neoplasm of endometrium: Secondary | ICD-10-CM | POA: Diagnosis not present

## 2022-03-02 DIAGNOSIS — G629 Polyneuropathy, unspecified: Secondary | ICD-10-CM

## 2022-03-02 DIAGNOSIS — Z5111 Encounter for antineoplastic chemotherapy: Secondary | ICD-10-CM | POA: Diagnosis not present

## 2022-03-02 LAB — CBC WITH DIFFERENTIAL/PLATELET
Abs Immature Granulocytes: 0.02 10*3/uL (ref 0.00–0.07)
Basophils Absolute: 0 10*3/uL (ref 0.0–0.1)
Basophils Relative: 1 %
Eosinophils Absolute: 0.1 10*3/uL (ref 0.0–0.5)
Eosinophils Relative: 4 %
HCT: 37.1 % (ref 36.0–46.0)
Hemoglobin: 12.2 g/dL (ref 12.0–15.0)
Immature Granulocytes: 1 %
Lymphocytes Relative: 19 %
Lymphs Abs: 0.5 10*3/uL — ABNORMAL LOW (ref 0.7–4.0)
MCH: 27.9 pg (ref 26.0–34.0)
MCHC: 32.9 g/dL (ref 30.0–36.0)
MCV: 84.9 fL (ref 80.0–100.0)
Monocytes Absolute: 0.1 10*3/uL (ref 0.1–1.0)
Monocytes Relative: 4 %
Neutro Abs: 2.1 10*3/uL (ref 1.7–7.7)
Neutrophils Relative %: 71 %
Platelets: 183 10*3/uL (ref 150–400)
RBC: 4.37 MIL/uL (ref 3.87–5.11)
RDW: 12.9 % (ref 11.5–15.5)
WBC: 2.9 10*3/uL — ABNORMAL LOW (ref 4.0–10.5)
nRBC: 0 % (ref 0.0–0.2)

## 2022-03-02 LAB — COMPREHENSIVE METABOLIC PANEL
ALT: 45 U/L — ABNORMAL HIGH (ref 0–44)
AST: 29 U/L (ref 15–41)
Albumin: 3.7 g/dL (ref 3.5–5.0)
Alkaline Phosphatase: 54 U/L (ref 38–126)
Anion gap: 7 (ref 5–15)
BUN: 23 mg/dL (ref 8–23)
CO2: 25 mmol/L (ref 22–32)
Calcium: 8.9 mg/dL (ref 8.9–10.3)
Chloride: 103 mmol/L (ref 98–111)
Creatinine, Ser: 0.64 mg/dL (ref 0.44–1.00)
GFR, Estimated: >60 mL/min (ref >60)
Glucose, Bld: 144 mg/dL — ABNORMAL HIGH (ref 70–99)
Potassium: 4.4 mmol/L (ref 3.5–5.1)
Sodium: 135 mmol/L (ref 135–145)
Total Bilirubin: 0.8 mg/dL (ref 0.3–1.2)
Total Protein: 7.2 g/dL (ref 6.5–8.1)

## 2022-03-02 MED ORDER — GABAPENTIN 100 MG PO CAPS: 200.0000 mg | ORAL_CAPSULE | Freq: Two times a day (BID) | ORAL | 1 refills | Status: DC

## 2022-03-02 NOTE — Progress Notes (Signed)
Patient here today for follow up after first chemotherapy treatment. Patient reports generalized pain and severe muscle spasms to legs, back and groin. Patient has been taking morphine as needed for pain. Pain 7/10 today.

## 2022-03-02 NOTE — Progress Notes (Signed)
[facility] OFFICE PROGRESS NOTE  Patient Care Team: Sofie Hartigan, MD as PCP - General (Family Medicine) Clent Jacks, RN as Oncology Nurse Navigator [place], [place], MD as Referring Physician (Gynecologic Oncology) Noreene Filbert, MD as Consulting Physician (Radiation Oncology) Shirline Frees, MD as Referring Physician (Radiation Oncology) Lonia Farber, MD as Consulting Physician (Endocrinology)  ASSESSMENT & PLAN:  Kathryn Maddox is a 71 yo F with pmh of morbid obesity, HTN and HLD was diagnosed with endometrioid cancer, grade 1, staged 1A per imaging in Feb 2022, referred to medical oncology to discuss about systemic treatment options upon progression.    #Endometrioid cancer, Stage III per imaging, pMMR and p53 wildtype - diagnosed with stage 1A grade 1 endometrioid cancer in Feb 2022 after post menopausal bleeding.  - not a surgical candidate due to morbid obesity  - s/p Mirena IUD with persistance of disease evident on repeat EMBx done in 10/22  - could not tolerate Norethindrone  - S/p EBRT at [place] followed by 4 fractions of vaginal brachytherapy at [facility] completed on 10/04/2021.  - PET/CT from 01/19/2022 showed focal area of increased FDG activity in fundus of the uterus and right pelvic and presacral lymph nodes concerning for nodal metastases.    -Patient seen today after cycle 1 of dose reduced carboplatin AUC 4 and paclitaxel 135 mg/m2. CBC and CMP reviewed.  WBC mildly low at 2.9 otherwise unremarkable.  Had difficulty tolerating chemo.  Patient reported severe generalized body aches and cramps after chemo.  Reports increase in neuropathy in her hands.  She was taking morphine 15 mg once daily which helped.  Discussed with patient to increase the use of morphine for better pain control which she was agreeable to.  Will start on gabapentin 200 mg twice daily for now. I will continue with the same dose of chemotherapy for cycle #2 however  if her neuropathy continues to worsen will need to do further dose reduction with Taxol.  We will add pembrolizumab with cycle 2.  -She has PET scheduled in November 2023 with a follow-up visit with Dr. Theora Gianotti.  #Abdominal pelvic pain,  -better. Could be related to underlying cancer. -continue morphine 15 mg q6prn.   RTC in 2 weeks for md visit, C2 carbo/taxol/pembro, labs   Orders Placed This Encounter  Procedures   CBC with Differential    Standing Status:   Future    Standing Expiration Date:   03/16/2023   Comprehensive metabolic panel    Standing Status:   Future    Standing Expiration Date:   03/16/2023    All questions were answered. The patient knows to call the clinic with any problems, questions or concerns. The total time spent in the appointment was 30 minutes encounter with patients including review of chart and various tests results, discussions about plan of care and coordination of care plan   [name], MD 03/02/2022 2:12 PM  INTERVAL HISTORY: Patient was seen today after cycle 1 of carboplatin and Taxol.  She started having generalized body aches and cramps starting Saturday late afternoon.  She describes it as really bad and has never experienced this before.  She has been taking morphine once daily only.  She does report increase in tingling sensation in her hands and feet.  Denies any nausea or vomiting.  Denies any fever, chills, shortness of breath or chest pain.   REVIEW OF SYSTEMS:   Pertinent positive ROS as above. All other systems were reviewed  with the patient and are negative.  I have reviewed the past medical history, past surgical history, social history and family history with the patient and they are unchanged from previous note.  ALLERGIES:  is allergic to megace [megestrol], penicillins, ibuprofen, and pravastatin.  MEDICATIONS:  Current Outpatient Medications  Medication Sig Dispense Refill   dexamethasone (DECADRON) 4 MG tablet Take 2  tablets (8 mg total) by mouth daily for 3 days. Start the day after chemotherapy. Take with food. 30 tablet 1   Easy Touch Lancets 30G/Twist MISC      gabapentin (NEURONTIN) 100 MG capsule Take 2 capsules (200 mg total) by mouth 2 (two) times daily. 120 capsule 1   lidocaine-prilocaine (EMLA) cream Apply 1 Application topically as needed. Apply to port 1 hour prior to chemotherapy. Cover with plastic wrap. 30 g 2   losartan (COZAAR) 50 MG tablet Take 50 mg by mouth daily.     morphine (MSIR) 15 MG tablet Take 1 tablet (15 mg total) by mouth every 6 (six) hours as needed for severe pain. 30 tablet 0   ondansetron (ZOFRAN) 8 MG tablet Take 1 tablet (8 mg total) by mouth every 8 (eight) hours as needed for nausea or vomiting. Start on the third day after chemotherapy. 30 tablet 1   prochlorperazine (COMPAZINE) 10 MG tablet Take 1 tablet (10 mg total) by mouth every 6 (six) hours as needed for nausea or vomiting. 30 tablet 1   tirzepatide (MOUNJARO) 5 MG/0.5ML Pen INJECT 5 MG UNDER THE SKIN EVERY 7 DAYS     No current facility-administered medications for this visit.    SUMMARY OF ONCOLOGIC HISTORY: Oncology History  Endometrial cancer ([place])  07/30/2020 Initial Diagnosis   She was seen by Dr. Leonides Schanz of OBGYN for post menopausal bleeding x 7-8 years.    08/14/2020 Imaging   Pelvic US showed  FINDINGS: Uterus: Measurements: 7.7 x 5.3 x 5.8 cm = volume: 123 mL. No fibroids or other mass visualized. Endometrium: Thickness: 10 mm.  No focal abnormality visualized. Right ovary: Nonvisualized. Left ovary: Nonvisualized. Other findings: No abnormal free fluid.   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 No or minimal myometrial invasion, less than 50%, suggesting stage 1a disease by imaging.  -She was not a surgical candidate due to morbid obesity.  -S/p IUD Mirena by Dr. Leafy Ro 4/22 - Repeat EMBx on 03/24/21 showed persistent  grade 1 endometrioid cancer.  - Unable to tolerate Norethidrone and IUD expelled.       Radiation Therapy   - S/p EBRT at [facility] followed by 4 fractions of vaginal brachytherapy at [facility] completed on 7/84/6962.   - complicated by radiation cystitis and diarrhea. Was seen by Dr. Delaine Lame of ID. Diagnosed with VRE colonization. Symptoms have largely improved since.     01/19/2022 Progression   PET scan performed at [facility]-  IMPRESSION:  1.  Focal area of markedly increased FDG activity in the fundus of the uterus which may represent uterine cancer  2.  Subcentimeter right pelvic and presacral lymph nodes with intense FDG activity concerning for nodal metastases.    02/24/2022 -  Chemotherapy   Patient is on Treatment Plan :  Dose reduced.   Carboplatin AUC 4 + Paclitaxel 135 mg/m2 q21d due to functional status and pre-existing peripheral neuropathy.        PHYSICAL EXAMINATION: ECOG PERFORMANCE STATUS: 2 - Symptomatic, <50% confined to bed  Vitals:  03/02/22 1333  BP: 139/86  Pulse: 89  Resp: 18  Temp: 99.1 F (37.3 C)    There were no vitals filed for this visit.   GENERAL:alert, no distress and comfortable SKIN: skin color, texture, turgor are normal, no rashes or significant lesions EYES: normal, Conjunctiva are pink and non-injected, sclera clear OROPHARYNX:no exudate, no erythema and lips, buccal mucosa, and tongue normal  NECK: supple, thyroid normal size, non-tender, without nodularity LYMPH:  no palpable lymphadenopathy in the cervical, axillary or inguinal LUNGS: clear to auscultation and percussion with normal breathing effort HEART: regular rate & rhythm and no murmurs and no lower extremity edema ABDOMEN:abdomen soft, non-tender and normal bowel sounds Musculoskeletal:no cyanosis of digits and no clubbing  NEURO: alert & oriented x 3 with fluent speech, no focal motor/sensory deficits  LABORATORY DATA:  I have reviewed the data as listed    Component  Value Date/Time   NA 135 03/02/2022 1230   K 4.4 03/02/2022 1230   CL 103 03/02/2022 1230   CO2 25 03/02/2022 1230   GLUCOSE 144 (H) 03/02/2022 1230   BUN 23 03/02/2022 1230   CREATININE 0.64 03/02/2022 1230   CALCIUM 8.9 03/02/2022 1230   PROT 7.2 03/02/2022 1230   ALBUMIN 3.7 03/02/2022 1230   AST 29 03/02/2022 1230   ALT 45 (H) 03/02/2022 1230   ALKPHOS 54 03/02/2022 1230   BILITOT 0.8 03/02/2022 1230   GFRNONAA >60 03/02/2022 1230    No results found for: "SPEP", "UPEP"  Lab Results  Component Value Date   WBC 2.9 (L) 03/02/2022   NEUTROABS 2.1 03/02/2022   HGB 12.2 03/02/2022   HCT 37.1 03/02/2022   MCV 84.9 03/02/2022   PLT 183 03/02/2022      Chemistry      Component Value Date/Time   NA 135 03/02/2022 1230   K 4.4 03/02/2022 1230   CL 103 03/02/2022 1230   CO2 25 03/02/2022 1230   BUN 23 03/02/2022 1230   CREATININE 0.64 03/02/2022 1230      Component Value Date/Time   CALCIUM 8.9 03/02/2022 1230   ALKPHOS 54 03/02/2022 1230   AST 29 03/02/2022 1230   ALT 45 (H) 03/02/2022 1230   BILITOT 0.8 03/02/2022 1230       RADIOGRAPHIC STUDIES: I have personally reviewed the radiological images as listed and agreed with the findings in the report. IR IMAGING GUIDED PORT INSERTION  Result Date: 02/17/2022 CLINICAL DATA:  Endometrial carcinoma and need for porta cath for chemotherapy. EXAM: IMPLANTED PORT A CATH PLACEMENT WITH ULTRASOUND AND FLUOROSCOPIC GUIDANCE ANESTHESIA/SEDATION: Moderate (conscious) sedation was employed during this procedure. A total of Versed 2.0 mg and Fentanyl 100 mcg was administered intravenously by radiology nursing. Moderate Sedation Time: 22 minutes. The patient's level of consciousness and vital signs were monitored continuously by radiology nursing throughout the procedure under my direct supervision. FLUOROSCOPY: 48 seconds.  14.1 mGy. PROCEDURE: The procedure, risks, benefits, and alternatives were explained to the patient.  Questions regarding the procedure were encouraged and answered. The patient understands and consents to the procedure. A time-out was performed prior to initiating the procedure. Ultrasound was utilized to confirm patency of the right internal jugular vein. A permanent ultrasound image was recorded. The right neck and chest were prepped with chlorhexidine in a sterile fashion, and a sterile drape was applied covering the operative field. Maximum barrier sterile technique with sterile gowns and gloves were used for the procedure. Local anesthesia was provided with 1% lidocaine. After creating  a small venotomy incision, a 21 gauge needle was advanced into the right internal jugular vein under direct, real-time ultrasound guidance. Ultrasound image documentation was performed. After securing guidewire access, an 8 Fr dilator was placed. A J-wire was kinked to measure appropriate catheter length. A subcutaneous port pocket was then created along the upper chest wall utilizing sharp and blunt dissection. Portable cautery was utilized. The pocket was irrigated with sterile saline. A single lumen power injectable port was chosen for placement. The 8 Fr catheter was tunneled from the port pocket site to the venotomy incision. The port was placed in the pocket. External catheter was trimmed to appropriate length based on guidewire measurement. At the venotomy, an 8 Fr peel-away sheath was placed over a guidewire. The catheter was then placed through the sheath and the sheath removed. Final catheter positioning was confirmed and documented with a fluoroscopic spot image. The port was accessed with a needle and aspirated and flushed with heparinized saline. The access needle was removed. The venotomy and port pocket incisions were closed with subcutaneous 3-0 Monocryl and subcuticular 4-0 Vicryl. Dermabond was applied to both incisions. COMPLICATIONS: COMPLICATIONS None FINDINGS: After catheter placement, the tip lies at the  cavo-atrial junction. The catheter aspirates normally and is ready for immediate use. IMPRESSION: Placement of single lumen port a cath via right internal jugular vein. The catheter tip lies at the cavo-atrial junction. A power injectable port a cath was placed and is ready for immediate use. Electronically Signed   By: Aletta Edouard M.D.   On: 02/17/2022 15:09   MM 3D SCREEN BREAST BILATERAL  Result Date: 02/10/2022 CLINICAL DATA:  Screening. EXAM: DIGITAL SCREENING BILATERAL MAMMOGRAM WITH TOMOSYNTHESIS AND CAD TECHNIQUE: Bilateral screening digital craniocaudal and mediolateral oblique mammograms were obtained. Bilateral screening digital breast tomosynthesis was performed. The images were evaluated with computer-aided detection. COMPARISON:  Previous exam(s). ACR Breast Density Category a: The breast tissue is almost entirely fatty. FINDINGS: There are no findings suspicious for malignancy. IMPRESSION: No mammographic evidence of malignancy. A result letter of this screening mammogram will be mailed directly to the patient. RECOMMENDATION: Screening mammogram in one year. (Code:[id number]-B-01Y) BI-RADS CATEGORY  1: Negative. Electronically Signed   By: Audie Pinto M.D.   On: 02/10/2022 14:16

## 2022-03-03 LAB — CULTURE, URINE COMPREHENSIVE

## 2022-03-08 DIAGNOSIS — G8929 Other chronic pain: Secondary | ICD-10-CM | POA: Diagnosis not present

## 2022-03-08 DIAGNOSIS — G4733 Obstructive sleep apnea (adult) (pediatric): Secondary | ICD-10-CM | POA: Diagnosis not present

## 2022-03-08 DIAGNOSIS — E1140 Type 2 diabetes mellitus with diabetic neuropathy, unspecified: Secondary | ICD-10-CM | POA: Diagnosis not present

## 2022-03-08 DIAGNOSIS — C55 Malignant neoplasm of uterus, part unspecified: Secondary | ICD-10-CM | POA: Diagnosis not present

## 2022-03-08 DIAGNOSIS — I10 Essential (primary) hypertension: Secondary | ICD-10-CM | POA: Diagnosis not present

## 2022-03-08 DIAGNOSIS — C541 Malignant neoplasm of endometrium: Secondary | ICD-10-CM | POA: Diagnosis not present

## 2022-03-08 DIAGNOSIS — Z6841 Body Mass Index (BMI) 40.0 and over, adult: Secondary | ICD-10-CM | POA: Diagnosis not present

## 2022-03-08 DIAGNOSIS — E1165 Type 2 diabetes mellitus with hyperglycemia: Secondary | ICD-10-CM | POA: Diagnosis not present

## 2022-03-14 DIAGNOSIS — G4733 Obstructive sleep apnea (adult) (pediatric): Secondary | ICD-10-CM | POA: Diagnosis not present

## 2022-03-15 ENCOUNTER — Other Ambulatory Visit: Payer: HMO

## 2022-03-15 ENCOUNTER — Encounter: Payer: Self-pay | Admitting: Internal Medicine

## 2022-03-15 ENCOUNTER — Inpatient Hospital Stay: Payer: HMO

## 2022-03-15 ENCOUNTER — Inpatient Hospital Stay (HOSPITAL_BASED_OUTPATIENT_CLINIC_OR_DEPARTMENT_OTHER): Payer: HMO | Admitting: Internal Medicine

## 2022-03-15 VITALS — BP 149/74 | HR 78 | Temp 96.9°F | Resp 16 | Wt 339.0 lb

## 2022-03-15 DIAGNOSIS — C541 Malignant neoplasm of endometrium: Secondary | ICD-10-CM

## 2022-03-15 DIAGNOSIS — E1142 Type 2 diabetes mellitus with diabetic polyneuropathy: Secondary | ICD-10-CM

## 2022-03-15 DIAGNOSIS — Z5111 Encounter for antineoplastic chemotherapy: Secondary | ICD-10-CM | POA: Diagnosis not present

## 2022-03-15 LAB — CBC WITH DIFFERENTIAL/PLATELET
Abs Immature Granulocytes: 0.04 10*3/uL (ref 0.00–0.07)
Basophils Absolute: 0 10*3/uL (ref 0.0–0.1)
Basophils Relative: 1 %
Eosinophils Absolute: 0.1 10*3/uL (ref 0.0–0.5)
Eosinophils Relative: 2 %
HCT: 34.8 % — ABNORMAL LOW (ref 36.0–46.0)
Hemoglobin: 11.5 g/dL — ABNORMAL LOW (ref 12.0–15.0)
Immature Granulocytes: 1 %
Lymphocytes Relative: 16 %
Lymphs Abs: 0.6 10*3/uL — ABNORMAL LOW (ref 0.7–4.0)
MCH: 28.1 pg (ref 26.0–34.0)
MCHC: 33 g/dL (ref 30.0–36.0)
MCV: 85.1 fL (ref 80.0–100.0)
Monocytes Absolute: 0.8 10*3/uL (ref 0.1–1.0)
Monocytes Relative: 20 %
Neutro Abs: 2.3 10*3/uL (ref 1.7–7.7)
Neutrophils Relative %: 60 %
Platelets: 187 10*3/uL (ref 150–400)
RBC: 4.09 MIL/uL (ref 3.87–5.11)
RDW: 13.6 % (ref 11.5–15.5)
WBC: 3.8 10*3/uL — ABNORMAL LOW (ref 4.0–10.5)
nRBC: 0 % (ref 0.0–0.2)

## 2022-03-15 LAB — COMPREHENSIVE METABOLIC PANEL
ALT: 26 U/L (ref 0–44)
AST: 21 U/L (ref 15–41)
Albumin: 3.5 g/dL (ref 3.5–5.0)
Alkaline Phosphatase: 60 U/L (ref 38–126)
Anion gap: 4 — ABNORMAL LOW (ref 5–15)
BUN: 16 mg/dL (ref 8–23)
CO2: 25 mmol/L (ref 22–32)
Calcium: 8.7 mg/dL — ABNORMAL LOW (ref 8.9–10.3)
Chloride: 108 mmol/L (ref 98–111)
Creatinine, Ser: 0.58 mg/dL (ref 0.44–1.00)
GFR, Estimated: >60 mL/min (ref >60)
Glucose, Bld: 141 mg/dL — ABNORMAL HIGH (ref 70–99)
Potassium: 3.8 mmol/L (ref 3.5–5.1)
Sodium: 137 mmol/L (ref 135–145)
Total Bilirubin: 0.6 mg/dL (ref 0.3–1.2)
Total Protein: 7.2 g/dL (ref 6.5–8.1)

## 2022-03-15 MED ORDER — SODIUM CHLORIDE 0.9 % IV SOLN
Freq: Once | INTRAVENOUS | Status: AC
  Filled 2022-03-15: qty 250

## 2022-03-15 MED ORDER — PALONOSETRON HCL INJECTION 0.25 MG/5ML
0.2500 mg | Freq: Once | INTRAVENOUS | Status: AC
  Administered 2022-03-15: 0.25 mg via INTRAVENOUS
  Filled 2022-03-15: qty 5

## 2022-03-15 MED ORDER — SODIUM CHLORIDE 0.9 % IV SOLN
135.0000 mg/m2 | Freq: Once | INTRAVENOUS | Status: AC
  Administered 2022-03-15: 354 mg via INTRAVENOUS
  Filled 2022-03-15: qty 59

## 2022-03-15 MED ORDER — SODIUM CHLORIDE 0.9 % IV SOLN
10.0000 mg | Freq: Once | INTRAVENOUS | Status: AC
  Administered 2022-03-15: 10 mg via INTRAVENOUS
  Filled 2022-03-15: qty 10

## 2022-03-15 MED ORDER — SODIUM CHLORIDE 0.9 % IV SOLN
600.0000 mg | Freq: Once | INTRAVENOUS | Status: AC
  Administered 2022-03-15: 600 mg via INTRAVENOUS
  Filled 2022-03-15: qty 60

## 2022-03-15 MED ORDER — SODIUM CHLORIDE 0.9 % IV SOLN
200.0000 mg | Freq: Once | INTRAVENOUS | Status: AC
  Administered 2022-03-15: 200 mg via INTRAVENOUS
  Filled 2022-03-15: qty 200

## 2022-03-15 MED ORDER — FAMOTIDINE IN NACL 20-0.9 MG/50ML-% IV SOLN
20.0000 mg | Freq: Once | INTRAVENOUS | Status: AC
  Administered 2022-03-15: 20 mg via INTRAVENOUS
  Filled 2022-03-15: qty 50

## 2022-03-15 MED ORDER — HEPARIN SOD (PORK) LOCK FLUSH 100 UNIT/ML IV SOLN
500.0000 [IU] | Freq: Once | INTRAVENOUS | Status: AC | PRN
  Administered 2022-03-15: 500 [IU]
  Filled 2022-03-15: qty 5

## 2022-03-15 MED ORDER — SODIUM CHLORIDE 0.9 % IV SOLN
150.0000 mg | Freq: Once | INTRAVENOUS | Status: AC
  Administered 2022-03-15: 150 mg via INTRAVENOUS
  Filled 2022-03-15: qty 150

## 2022-03-15 MED ORDER — DIPHENHYDRAMINE HCL 50 MG/ML IJ SOLN
50.0000 mg | Freq: Once | INTRAMUSCULAR | Status: AC
  Administered 2022-03-15: 50 mg via INTRAVENOUS
  Filled 2022-03-15: qty 1

## 2022-03-15 NOTE — Progress Notes (Signed)
Pt in for follow up, reports having pain in knees bilaterally.  Pt also states having some loose stools 1-2 times a day for a week.

## 2022-03-15 NOTE — Progress Notes (Signed)
[facility] OFFICE PROGRESS NOTE  Patient Care Team: Sofie Hartigan, MD as PCP - General (Family Medicine) Clent Jacks, RN as Oncology Nurse Navigator [place], [place], MD as Referring Physician (Gynecologic Oncology) Noreene Filbert, MD as Consulting Physician (Radiation Oncology) Shirline Frees, MD as Referring Physician (Radiation Oncology) Lonia Farber, MD as Consulting Physician (Endocrinology)  ASSESSMENT & PLAN:  Kathryn Maddox is a 71 yo F with pmh of morbid obesity, HTN and HLD was diagnosed with endometrioid cancer, grade 1, staged 1A per imaging in Feb 2022, referred to medical oncology to discuss about systemic treatment options upon progression.    #Endometrioid cancer, Stage III per imaging, pMMR and p53 wildtype - diagnosed with stage 1A grade 1 endometrioid cancer in Feb 2022 after post menopausal bleeding.  - not a surgical candidate due to morbid obesity  - s/p Mirena IUD with persistance of disease evident on repeat EMBx done in 10/22  - could not tolerate Norethindrone  - S/p EBRT at [facility] followed by 4 fractions of vaginal brachytherapy at [facility] completed on 10/04/2021.  - PET/CT from 01/19/2022 showed focal area of increased FDG activity in fundus of the uterus and right pelvic and presacral lymph nodes concerning for nodal metastases.    -Patient seen today prior to cycle 2 of CarboTaxol.  Pembrolizumab added.  Labs reviewed acceptable.  We will proceed with the treatment.  She noticed increase in her neuropathy posttreatment and was started on gabapentin.  Now her neuropathy has returned to her baseline.  We will continue to closely monitor.  Last time when she got IV Benadryl she noticed sudden onset of pelvic pain.  We discussed about transitioning to p.o. Benadryl.  She would like to keep IV for now.   -She has PET scheduled in November 2023 with a follow-up visit with Dr. Theora Gianotti.  #Abdominal pelvic pain,  -better. Could  be related to underlying cancer. -continue morphine 15 mg q6prn.   #Neuropathy from long standing diabetes - closely monitor on taxol. Has some increase post cycle 1 but returned to baseline with addition of gabapentin.  -She will use cryotherapy mittens and socks today.  # access - port   RTC in 9 days for md visit, labs  RTC in 3 weeks for md visit, C3 and labs   Orders Placed This Encounter  Procedures   CBC with Differential    Standing Status:   Future    Standing Expiration Date:   04/06/2023   Comprehensive metabolic panel    Standing Status:   Future    Standing Expiration Date:   04/06/2023    All questions were answered. The patient knows to call the clinic with any problems, questions or concerns. The total time spent in the appointment was 30 minutes encounter with patients including review of chart and various tests results, discussions about plan of care and coordination of care plan   [name], MD 03/15/2022 9:52 AM  INTERVAL HISTORY: Patient is a 72 year old female with diagnosis of stage III endometrioid cancer s/p Mirena IUD, could not tolerate norethindrone, EBRT and vaginal brachytherapy now on systemic treatment. Seen today prior to cycle 2 of CarboTaxol and pembrolizumab.  She had difficulty tolerating cycle 1 with diffuse shooting pain in her body.  Morphine helped.  Denies any fevers, chills, nausea vomiting, bowel or bladder issues.  Her appetite is fair.  Keeping herself well-hydrated.  Has started noticing hair loss.  Did notice some transient increase in her  neuropathy in the hands and feet which has improved with gabapentin and is back to her baseline now.  REVIEW OF SYSTEMS:   Pertinent positive ROS as above. All other systems were reviewed with the patient and are negative.  I have reviewed the past medical history, past surgical history, social history and family history with the patient and they are unchanged from previous note.  ALLERGIES:   is allergic to megace [megestrol], penicillins, ibuprofen, and pravastatin.  MEDICATIONS:  Current Outpatient Medications  Medication Sig Dispense Refill   dexamethasone (DECADRON) 4 MG tablet Take 2 tablets (8 mg total) by mouth daily for 3 days. Start the day after chemotherapy. Take with food. 30 tablet 1   Easy Touch Lancets 30G/Twist MISC      gabapentin (NEURONTIN) 100 MG capsule Take 2 capsules (200 mg total) by mouth 2 (two) times daily. 120 capsule 1   lidocaine-prilocaine (EMLA) cream Apply 1 Application topically as needed. Apply to port 1 hour prior to chemotherapy. Cover with plastic wrap. 30 g 2   losartan (COZAAR) 50 MG tablet Take 50 mg by mouth daily.     morphine (MSIR) 15 MG tablet Take 1 tablet (15 mg total) by mouth every 6 (six) hours as needed for severe pain. 30 tablet 0   tirzepatide (MOUNJARO) 5 MG/0.5ML Pen INJECT 5 MG UNDER THE SKIN EVERY 7 DAYS     ondansetron (ZOFRAN) 8 MG tablet Take 1 tablet (8 mg total) by mouth every 8 (eight) hours as needed for nausea or vomiting. Start on the third day after chemotherapy. (Patient not taking: Reported on 03/15/2022) 30 tablet 1   prochlorperazine (COMPAZINE) 10 MG tablet Take 1 tablet (10 mg total) by mouth every 6 (six) hours as needed for nausea or vomiting. (Patient not taking: Reported on 03/15/2022) 30 tablet 1   No current facility-administered medications for this visit.   Facility-Administered Medications Ordered in Other Visits  Medication Dose Route Frequency Provider Last Rate Last Admin   CARBOplatin (PARAPLATIN) 600 mg in sodium chloride 0.9 % 250 mL chemo infusion  600 mg Intravenous Once [name], MD       dexamethasone (DECADRON) 10 mg in sodium chloride 0.9 % 50 mL IVPB  10 mg Intravenous Once [name], MD       diphenhydrAMINE (BENADRYL) injection 50 mg  50 mg Intravenous Once [name], MD       famotidine (PEPCID) IVPB 20 mg premix  20 mg Intravenous Once [name], MD        fosaprepitant (EMEND) 150 mg in sodium chloride 0.9 % 145 mL IVPB  150 mg Intravenous Once [name], MD       heparin lock flush 100 unit/mL  500 Units Intracatheter Once PRN [name], MD       PACLitaxel (TAXOL) 354 mg in sodium chloride 0.9 % 500 mL chemo infusion (> 12m/m2)  135 mg/m2 (Treatment Plan Recorded) Intravenous Once AJane Canary MD       palonosetron (ALOXI) injection 0.25 mg  0.25 mg Intravenous Once AJane Canary MD       pembrolizumab ([facility] 200 mg in sodium chloride 0.9 % 50 mL chemo infusion  200 mg Intravenous Once AJane Canary MD        SUMMARY OF ONCOLOGIC HISTORY: Oncology History  Endometrial cancer ([facility]  07/30/2020 Initial Diagnosis   She was seen by Dr. WLeonides Schanzof OBGYN for post menopausal bleeding x 7-8 years.    08/14/2020 Imaging   Pelvic [facility]  showed  FINDINGS: Uterus: Measurements: 7.7 x 5.3 x 5.8 cm = volume: 123 mL. No fibroids or other mass visualized. Endometrium: Thickness: 10 mm.  No focal abnormality visualized. Right ovary: Nonvisualized. Left ovary: Nonvisualized. Other findings: No abnormal free fluid.   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 No or minimal myometrial invasion, less than 50%, suggesting stage 1a disease by imaging.  -She was not a surgical candidate due to morbid obesity.  -S/p IUD Mirena by Dr. Leafy Ro 4/22 - Repeat EMBx on 03/24/21 showed persistent grade 1 endometrioid cancer.  - Unable to tolerate Norethidrone and IUD expelled.       Radiation Therapy   - S/p EBRT at [facility] followed by 4 fractions of vaginal brachytherapy at [place] completed on 5/73/2202.   - complicated by radiation cystitis and diarrhea. Was seen by Dr. Delaine Lame of ID. Diagnosed with VRE colonization. Symptoms have largely improved since.     01/19/2022 Progression   PET scan performed at [facility]-  IMPRESSION:  1.  Focal area of markedly increased FDG  activity in the fundus of the uterus which may represent uterine cancer  2.  Subcentimeter right pelvic and presacral lymph nodes with intense FDG activity concerning for nodal metastases.    02/24/2022 -  Chemotherapy   Patient is on Treatment Plan :  Dose reduced.   Carboplatin AUC 4 + Paclitaxel 135 mg/m2 q21d due to functional status and pre-existing peripheral neuropathy.        PHYSICAL EXAMINATION: ECOG PERFORMANCE STATUS: 2 - Symptomatic, <50% confined to bed  Vitals:   03/15/22 0902  BP: (!) 149/74  Pulse: 78  Resp: 16  Temp: (!) 96.9 F (36.1 C)  SpO2: 95%    Filed Weights   03/15/22 0902  Weight: (!) 339 lb (153.8 kg)     GENERAL:alert, no distress and comfortable SKIN: skin color, texture, turgor are normal, no rashes or significant lesions EYES: normal, Conjunctiva are pink and non-injected, sclera clear OROPHARYNX:no exudate, no erythema and lips, buccal mucosa, and tongue normal  NECK: supple, thyroid normal size, non-tender, without nodularity LYMPH:  no palpable lymphadenopathy in the cervical, axillary or inguinal LUNGS: clear to auscultation and percussion with normal breathing effort HEART: regular rate & rhythm and no murmurs and no lower extremity edema ABDOMEN:abdomen soft, non-tender and normal bowel sounds Musculoskeletal:no cyanosis of digits and no clubbing  NEURO: alert & oriented x 3 with fluent speech, no focal motor/sensory deficits  LABORATORY DATA:  I have reviewed the data as listed    Component Value Date/Time   NA 137 03/15/2022 0842   K 3.8 03/15/2022 0842   CL 108 03/15/2022 0842   CO2 25 03/15/2022 0842   GLUCOSE 141 (H) 03/15/2022 0842   BUN 16 03/15/2022 0842   CREATININE 0.58 03/15/2022 0842   CALCIUM 8.7 (L) 03/15/2022 0842   PROT 7.2 03/15/2022 0842   ALBUMIN 3.5 03/15/2022 0842   AST 21 03/15/2022 0842   ALT 26 03/15/2022 0842   ALKPHOS 60 03/15/2022 0842   BILITOT 0.6 03/15/2022 0842   GFRNONAA >60 03/15/2022 0842     No results found for: "SPEP", "UPEP"  Lab Results  Component Value Date   WBC 3.8 (L) 03/15/2022   NEUTROABS 2.3 03/15/2022   HGB 11.5 (L) 03/15/2022   HCT 34.8 (L) 03/15/2022   MCV 85.1 03/15/2022   PLT 187 03/15/2022      Chemistry  Component Value Date/Time   NA 137 03/15/2022 0842   K 3.8 03/15/2022 0842   CL 108 03/15/2022 0842   CO2 25 03/15/2022 0842   BUN 16 03/15/2022 0842   CREATININE 0.58 03/15/2022 0842      Component Value Date/Time   CALCIUM 8.7 (L) 03/15/2022 0842   ALKPHOS 60 03/15/2022 0842   AST 21 03/15/2022 0842   ALT 26 03/15/2022 0842   BILITOT 0.6 03/15/2022 0842       RADIOGRAPHIC STUDIES: I have personally reviewed the radiological images as listed and agreed with the findings in the report. IR IMAGING GUIDED PORT INSERTION  Result Date: 02/17/2022 CLINICAL DATA:  Endometrial carcinoma and need for porta cath for chemotherapy. EXAM: IMPLANTED PORT A CATH PLACEMENT WITH ULTRASOUND AND FLUOROSCOPIC GUIDANCE ANESTHESIA/SEDATION: Moderate (conscious) sedation was employed during this procedure. A total of Versed 2.0 mg and Fentanyl 100 mcg was administered intravenously by radiology nursing. Moderate Sedation Time: 22 minutes. The patient's level of consciousness and vital signs were monitored continuously by radiology nursing throughout the procedure under my direct supervision. FLUOROSCOPY: 48 seconds.  14.1 mGy. PROCEDURE: The procedure, risks, benefits, and alternatives were explained to the patient. Questions regarding the procedure were encouraged and answered. The patient understands and consents to the procedure. A time-out was performed prior to initiating the procedure. Ultrasound was utilized to confirm patency of the right internal jugular vein. A permanent ultrasound image was recorded. The right neck and chest were prepped with chlorhexidine in a sterile fashion, and a sterile drape was applied covering the operative field. Maximum  barrier sterile technique with sterile gowns and gloves were used for the procedure. Local anesthesia was provided with 1% lidocaine. After creating a small venotomy incision, a 21 gauge needle was advanced into the right internal jugular vein under direct, real-time ultrasound guidance. Ultrasound image documentation was performed. After securing guidewire access, an 8 Fr dilator was placed. A J-wire was kinked to measure appropriate catheter length. A subcutaneous port pocket was then created along the upper chest wall utilizing sharp and blunt dissection. Portable cautery was utilized. The pocket was irrigated with sterile saline. A single lumen power injectable port was chosen for placement. The 8 Fr catheter was tunneled from the port pocket site to the venotomy incision. The port was placed in the pocket. External catheter was trimmed to appropriate length based on guidewire measurement. At the venotomy, an 8 Fr peel-away sheath was placed over a guidewire. The catheter was then placed through the sheath and the sheath removed. Final catheter positioning was confirmed and documented with a fluoroscopic spot image. The port was accessed with a needle and aspirated and flushed with heparinized saline. The access needle was removed. The venotomy and port pocket incisions were closed with subcutaneous 3-0 Monocryl and subcuticular 4-0 Vicryl. Dermabond was applied to both incisions. COMPLICATIONS: COMPLICATIONS None FINDINGS: After catheter placement, the tip lies at the cavo-atrial junction. The catheter aspirates normally and is ready for immediate use. IMPRESSION: Placement of single lumen port a cath via right internal jugular vein. The catheter tip lies at the cavo-atrial junction. A power injectable port a cath was placed and is ready for immediate use. Electronically Signed   By: Aletta Edouard M.D.   On: 02/17/2022 15:09

## 2022-03-15 NOTE — Patient Instructions (Signed)
[facility]  Discharge Instructions: Thank you for choosing [facility] to provide your oncology and hematology care.  If you have a lab appointment with the Cancer Center, please go directly to the Cancer Center and check in at the registration area.  Wear comfortable clothing and clothing appropriate for easy access to any Portacath or PICC line.   We strive to give you quality time with your provider. You may need to reschedule your appointment if you arrive late (15 or more minutes).  Arriving late affects you and other patients whose appointments are after yours.  Also, if you miss three or more appointments without notifying the office, you may be dismissed from the clinic at the provider's discretion.      For prescription refill requests, have your pharmacy contact our office and allow 72 hours for refills to be completed.    Today you received the following chemotherapy and/or immunotherapy agents Keytruda, Taxol and Carboplatin       To help prevent nausea and vomiting after your treatment, we encourage you to take your nausea medication as directed.  BELOW ARE SYMPTOMS THAT SHOULD BE REPORTED IMMEDIATELY: *FEVER GREATER THAN 100.4 F (38 C) OR HIGHER *CHILLS OR SWEATING *NAUSEA AND VOMITING THAT IS NOT CONTROLLED WITH YOUR NAUSEA MEDICATION *UNUSUAL SHORTNESS OF BREATH *UNUSUAL BRUISING OR BLEEDING *URINARY PROBLEMS (pain or burning when urinating, or frequent urination) *BOWEL PROBLEMS (unusual diarrhea, constipation, pain near the anus) TENDERNESS IN MOUTH AND THROAT WITH OR WITHOUT PRESENCE OF ULCERS (sore throat, sores in mouth, or a toothache) UNUSUAL RASH, SWELLING OR PAIN  UNUSUAL VAGINAL DISCHARGE OR ITCHING   Items with * indicate a potential emergency and should be followed up as soon as possible or go to the Emergency Department if any problems should occur.  Please show the CHEMOTHERAPY ALERT CARD or IMMUNOTHERAPY  ALERT CARD at check-in to the Emergency Department and triage nurse.  Should you have questions after your visit or need to cancel or reschedule your appointment, please contact [facility]  [phone] and follow the prompts.  Office hours are 8:00 a.m. to 4:30 p.m. Monday - Friday. Please note that voicemails left after 4:00 p.m. may not be returned until the following business day.  We are closed weekends and major holidays. You have access to a nurse at all times for urgent questions. Please call the main number to the clinic [phone] and follow the prompts.  For any non-urgent questions, you may also contact your provider using MyChart. We now offer e-Visits for anyone 18 and older to request care online for non-urgent symptoms. For details visit [email or web address].   Also download the MyChart app! Go to the app store, search "MyChart", open the app, select [facility], and log in with your MyChart username and password.  Masks are optional in the cancer centers. If you would like for your care team to wear a mask while they are taking care of you, please let them know. For doctor visits, patients may have with them one support person who is at least 71 years old. At this time, visitors are not allowed in the infusion area.   

## 2022-03-24 ENCOUNTER — Inpatient Hospital Stay: Payer: HMO

## 2022-03-24 ENCOUNTER — Encounter: Payer: Self-pay | Admitting: Internal Medicine

## 2022-03-24 ENCOUNTER — Inpatient Hospital Stay (HOSPITAL_BASED_OUTPATIENT_CLINIC_OR_DEPARTMENT_OTHER): Payer: HMO | Admitting: Internal Medicine

## 2022-03-24 ENCOUNTER — Inpatient Hospital Stay: Payer: HMO | Attending: Obstetrics and Gynecology

## 2022-03-24 VITALS — BP 143/55 | HR 84 | Temp 97.7°F | Resp 18 | Wt 341.6 lb

## 2022-03-24 DIAGNOSIS — Z79899 Other long term (current) drug therapy: Secondary | ICD-10-CM | POA: Insufficient documentation

## 2022-03-24 DIAGNOSIS — Z5111 Encounter for antineoplastic chemotherapy: Secondary | ICD-10-CM | POA: Diagnosis present

## 2022-03-24 DIAGNOSIS — I10 Essential (primary) hypertension: Secondary | ICD-10-CM | POA: Insufficient documentation

## 2022-03-24 DIAGNOSIS — E1142 Type 2 diabetes mellitus with diabetic polyneuropathy: Secondary | ICD-10-CM

## 2022-03-24 DIAGNOSIS — E876 Hypokalemia: Secondary | ICD-10-CM | POA: Diagnosis not present

## 2022-03-24 DIAGNOSIS — N950 Postmenopausal bleeding: Secondary | ICD-10-CM | POA: Insufficient documentation

## 2022-03-24 DIAGNOSIS — Z5112 Encounter for antineoplastic immunotherapy: Secondary | ICD-10-CM | POA: Insufficient documentation

## 2022-03-24 DIAGNOSIS — C541 Malignant neoplasm of endometrium: Secondary | ICD-10-CM | POA: Insufficient documentation

## 2022-03-24 DIAGNOSIS — E785 Hyperlipidemia, unspecified: Secondary | ICD-10-CM | POA: Diagnosis not present

## 2022-03-24 DIAGNOSIS — Z95828 Presence of other vascular implants and grafts: Secondary | ICD-10-CM

## 2022-03-24 LAB — CBC WITH DIFFERENTIAL/PLATELET
Abs Immature Granulocytes: 0.01 10*3/uL (ref 0.00–0.07)
Basophils Absolute: 0 10*3/uL (ref 0.0–0.1)
Basophils Relative: 0 %
Eosinophils Absolute: 0.1 10*3/uL (ref 0.0–0.5)
Eosinophils Relative: 4 %
HCT: 32.2 % — ABNORMAL LOW (ref 36.0–46.0)
Hemoglobin: 10.7 g/dL — ABNORMAL LOW (ref 12.0–15.0)
Immature Granulocytes: 0 %
Lymphocytes Relative: 23 %
Lymphs Abs: 0.5 10*3/uL — ABNORMAL LOW (ref 0.7–4.0)
MCH: 27.9 pg (ref 26.0–34.0)
MCHC: 33.2 g/dL (ref 30.0–36.0)
MCV: 83.9 fL (ref 80.0–100.0)
Monocytes Absolute: 0.3 10*3/uL (ref 0.1–1.0)
Monocytes Relative: 12 %
Neutro Abs: 1.4 10*3/uL — ABNORMAL LOW (ref 1.7–7.7)
Neutrophils Relative %: 61 %
Platelets: 123 10*3/uL — ABNORMAL LOW (ref 150–400)
RBC: 3.84 MIL/uL — ABNORMAL LOW (ref 3.87–5.11)
RDW: 13.6 % (ref 11.5–15.5)
WBC: 2.3 10*3/uL — ABNORMAL LOW (ref 4.0–10.5)
nRBC: 0 % (ref 0.0–0.2)

## 2022-03-24 LAB — BASIC METABOLIC PANEL
Anion gap: 5 (ref 5–15)
BUN: 15 mg/dL (ref 8–23)
CO2: 25 mmol/L (ref 22–32)
Calcium: 8.2 mg/dL — ABNORMAL LOW (ref 8.9–10.3)
Chloride: 107 mmol/L (ref 98–111)
Creatinine, Ser: 0.64 mg/dL (ref 0.44–1.00)
GFR, Estimated: >60 mL/min (ref >60)
Glucose, Bld: 145 mg/dL — ABNORMAL HIGH (ref 70–99)
Potassium: 3.4 mmol/L — ABNORMAL LOW (ref 3.5–5.1)
Sodium: 137 mmol/L (ref 135–145)

## 2022-03-24 MED ORDER — HEPARIN SOD (PORK) LOCK FLUSH 100 UNIT/ML IV SOLN
500.0000 [IU] | Freq: Once | INTRAVENOUS | Status: AC
  Administered 2022-03-24: 500 [IU] via INTRAVENOUS
  Filled 2022-03-24: qty 5

## 2022-03-24 MED ORDER — SODIUM CHLORIDE 0.9% FLUSH
10.0000 mL | Freq: Once | INTRAVENOUS | Status: AC
  Administered 2022-03-24: 10 mL via INTRAVENOUS
  Filled 2022-03-24: qty 10

## 2022-03-24 NOTE — Progress Notes (Signed)
[place] OFFICE PROGRESS NOTE  Patient Care Team: Sofie Hartigan, MD as PCP - General (Family Medicine) Clent Jacks, RN as Oncology Nurse Navigator [place], [place], MD as Referring Physician (Gynecologic Oncology) Noreene Filbert, MD as Consulting Physician (Radiation Oncology) Shirline Frees, MD as Referring Physician (Radiation Oncology) Lonia Farber, MD as Consulting Physician (Endocrinology)  CURRENT TREATMENT-  Carboplatin AUC 4 and Paclitaxel 135 mg/m2 q21 days started on 02/24/2022. Pembrolizumab added with cycle 2  ASSESSMENT & PLAN:  Kathryn Maddox is a 71 yo F with pmh of morbid obesity, HTN and HLD was diagnosed with endometrioid cancer, grade 1, staged 1A per imaging in Feb 2022, referred to medical oncology to discuss about systemic treatment options upon progression.    #Endometrioid cancer, Stage III per imaging, pMMR and p53 wildtype - diagnosed with stage 1A grade 1 endometrioid cancer in Feb 2022 after post menopausal bleeding.  - not a surgical candidate due to morbid obesity  - s/p Mirena IUD with persistance of disease evident on repeat EMBx done in 10/22  - could not tolerate Norethindrone  - S/p EBRT at [place] followed by 4 fractions of vaginal brachytherapy at [place] completed on 10/04/2021.  - PET/CT from 01/19/2022 showed focal area of increased FDG activity in fundus of the uterus and right pelvic and presacral lymph nodes concerning for nodal metastases.    -Patient seen mid cycle 2 of CarboTaxol and pembrolizumab.  Labs reviewed. WBC 2.3, ANC 1.4, hemoglobin 10.7 platelets 123.  She continues to have difficulty with tolerating chemotherapy primarily from severe exhaustion.  Also gets body aches which improves with morphine.  There is a possibility that she may not be able to tolerate total 6 cycles of chemotherapy.  We discussed about doing 4 cycles and then getting a PET scan.  Deaccess port today.  She is not eating much  solids for keeping herself well-hydrated.  She has soft bowel movements every time she eats solid foods.  We discussed about trial of Imodium 2 mg every 6 as needed.  She has some at home and will think about it.  #Hypokalemia -Potassium 3.4.  Discussed about diet rich in potassium.  #Abdominal pelvic pain,  -controlled. Could be related to underlying cancer. -continue morphine 15 mg q6prn.   #Neuropathy from long standing diabetes -closely monitor on taxol. Has some increase post cycle 1 but returned to baseline with addition of gabapentin.  -using cryotherapy mittens and socks today.  # access - port   RTC on 04/05/2022 for MD visit, labs and cycle 3.  No orders of the defined types were placed in this encounter.   All questions were answered. The patient knows to call the clinic with any problems, questions or concerns. The total time spent in the appointment was 30 minutes encounter with patients including review of chart and various tests results, discussions about plan of care and coordination of care plan   [name], MD 03/24/2022 11:36 AM  INTERVAL HISTORY: [name] with diagnosis of stage III endometrioid cancer s/p Mirena IUD, could not tolerate norethindrone, EBRT and vaginal brachytherapy now on systemic treatment.  Seen today mid cycle 2 of CarboTaxol and pembrolizumab for treatment toxicity check.  She continues to have difficulty with tolerating chemotherapy primarily from severe exhaustion.  She does get body aches which responded well to morphine.  Denies any nausea or vomiting.  Her appetite is fair. She is keeping herself well-hydrated but not able to  eat much solids.  Reports every time she eats she has a small soft bowel movement.  Her neuropathy is mildly increased from baseline but responds well to gabapentin.   REVIEW OF SYSTEMS:   Pertinent positive ROS as above. All other systems were reviewed with the patient and are  negative.  I have reviewed the past medical history, past surgical history, social history and family history with the patient and they are unchanged from previous note.  ALLERGIES:  is allergic to megace [megestrol], penicillins, ibuprofen, and pravastatin.  MEDICATIONS:  Current Outpatient Medications  Medication Sig Dispense Refill   dexamethasone (DECADRON) 4 MG tablet Take 2 tablets (8 mg total) by mouth daily for 3 days. Start the day after chemotherapy. Take with food. 30 tablet 1   Easy Touch Lancets 30G/Twist MISC      gabapentin (NEURONTIN) 100 MG capsule Take 2 capsules (200 mg total) by mouth 2 (two) times daily. 120 capsule 1   lidocaine-prilocaine (EMLA) cream Apply 1 Application topically as needed. Apply to port 1 hour prior to chemotherapy. Cover with plastic wrap. 30 g 2   losartan (COZAAR) 50 MG tablet Take 50 mg by mouth daily.     morphine (MSIR) 15 MG tablet Take 1 tablet (15 mg total) by mouth every 6 (six) hours as needed for severe pain. 30 tablet 0   tirzepatide (MOUNJARO) 5 MG/0.5ML Pen INJECT 5 MG UNDER THE SKIN EVERY 7 DAYS     ondansetron (ZOFRAN) 8 MG tablet Take 1 tablet (8 mg total) by mouth every 8 (eight) hours as needed for nausea or vomiting. Start on the third day after chemotherapy. (Patient not taking: Reported on 03/15/2022) 30 tablet 1   prochlorperazine (COMPAZINE) 10 MG tablet Take 1 tablet (10 mg total) by mouth every 6 (six) hours as needed for nausea or vomiting. (Patient not taking: Reported on 03/15/2022) 30 tablet 1   No current facility-administered medications for this visit.    SUMMARY OF ONCOLOGIC HISTORY: Oncology History  Endometrial cancer ([place])  07/30/2020 Initial Diagnosis   She was seen by Dr. Leonides Schanz of OBGYN for post menopausal bleeding x 7-8 years.    08/14/2020 Imaging   Pelvic US showed  FINDINGS: Uterus: Measurements: 7.7 x 5.3 x 5.8 cm = volume: 123 mL. No fibroids or other mass visualized. Endometrium: Thickness: 10 mm.  No  focal abnormality visualized. Right ovary: Nonvisualized. Left ovary: Nonvisualized. Other findings: No abnormal free fluid.   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 No or minimal myometrial invasion, less than 50%, suggesting stage 1a disease by imaging.  -She was not a surgical candidate due to morbid obesity.  -S/p IUD Mirena by Dr. Leafy Ro 4/22 - Repeat EMBx on 03/24/21 showed persistent grade 1 endometrioid cancer.  - Unable to tolerate Norethidrone and IUD expelled.       Radiation Therapy   - S/p EBRT at [place] followed by 4 fractions of vaginal brachytherapy at [place] completed on 1/51/7616.   - complicated by radiation cystitis and diarrhea. Was seen by Dr. Delaine Lame of ID. Diagnosed with VRE colonization. Symptoms have largely improved since.     01/19/2022 Progression   PET scan performed at [facility]-  IMPRESSION:  1.  Focal area of markedly increased FDG activity in the fundus of the uterus which may represent uterine cancer  2.  Subcentimeter right pelvic and presacral lymph nodes with intense FDG activity concerning for nodal metastases.  02/24/2022 -  Chemotherapy   Patient is on Treatment Plan :  Dose reduced.   Carboplatin AUC 4 + Paclitaxel 135 mg/m2 q21d due to functional status and pre-existing peripheral neuropathy.        PHYSICAL EXAMINATION: ECOG PERFORMANCE STATUS: 2 - Symptomatic, <50% confined to bed  Vitals:   03/24/22 1041  BP: (!) 143/55  Pulse: 84  Resp: 18  Temp: 97.7 F (36.5 C)  SpO2: 96%    Filed Weights   03/24/22 1041  Weight: (!) 341 lb 9.6 oz (154.9 kg)     GENERAL:alert, no distress and comfortable SKIN: skin color, texture, turgor are normal, no rashes or significant lesions EYES: normal, Conjunctiva are pink and non-injected, sclera clear OROPHARYNX:no exudate, no erythema and lips, buccal mucosa, and tongue normal  NECK: supple, thyroid normal  size, non-tender, without nodularity LYMPH:  no palpable lymphadenopathy in the cervical, axillary or inguinal LUNGS: clear to auscultation and percussion with normal breathing effort HEART: regular rate & rhythm and no murmurs and no lower extremity edema ABDOMEN:abdomen soft, non-tender and normal bowel sounds Musculoskeletal:no cyanosis of digits and no clubbing  NEURO: alert & oriented x 3 with fluent speech, no focal motor/sensory deficits  LABORATORY DATA:  I have reviewed the data as listed    Component Value Date/Time   NA 137 03/24/2022 1030   K 3.4 (L) 03/24/2022 1030   CL 107 03/24/2022 1030   CO2 25 03/24/2022 1030   GLUCOSE 145 (H) 03/24/2022 1030   BUN 15 03/24/2022 1030   CREATININE 0.64 03/24/2022 1030   CALCIUM 8.2 (L) 03/24/2022 1030   PROT 7.2 03/15/2022 0842   ALBUMIN 3.5 03/15/2022 0842   AST 21 03/15/2022 0842   ALT 26 03/15/2022 0842   ALKPHOS 60 03/15/2022 0842   BILITOT 0.6 03/15/2022 0842   GFRNONAA >60 03/24/2022 1030    No results found for: "SPEP", "UPEP"  Lab Results  Component Value Date   WBC 2.3 (L) 03/24/2022   NEUTROABS 1.4 (L) 03/24/2022   HGB 10.7 (L) 03/24/2022   HCT 32.2 (L) 03/24/2022   MCV 83.9 03/24/2022   PLT 123 (L) 03/24/2022      Chemistry      Component Value Date/Time   NA 137 03/24/2022 1030   K 3.4 (L) 03/24/2022 1030   CL 107 03/24/2022 1030   CO2 25 03/24/2022 1030   BUN 15 03/24/2022 1030   CREATININE 0.64 03/24/2022 1030      Component Value Date/Time   CALCIUM 8.2 (L) 03/24/2022 1030   ALKPHOS 60 03/15/2022 0842   AST 21 03/15/2022 0842   ALT 26 03/15/2022 0842   BILITOT 0.6 03/15/2022 0842       RADIOGRAPHIC STUDIES: I have personally reviewed the radiological images as listed and agreed with the findings in the report. No results found.

## 2022-03-24 NOTE — Progress Notes (Signed)
Pt states she has diarrhea right after she eats her meals which makes her not want to eat. States as soon as she eats she has to rush to restroom.

## 2022-04-04 MED FILL — Fosaprepitant Dimeglumine For IV Infusion 150 MG (Base Eq): INTRAVENOUS | Qty: 5 | Status: AC

## 2022-04-04 MED FILL — Dexamethasone Sodium Phosphate Inj 100 MG/10ML: INTRAMUSCULAR | Qty: 1 | Status: AC

## 2022-04-05 ENCOUNTER — Inpatient Hospital Stay: Payer: HMO

## 2022-04-05 ENCOUNTER — Inpatient Hospital Stay (HOSPITAL_BASED_OUTPATIENT_CLINIC_OR_DEPARTMENT_OTHER): Payer: HMO | Admitting: Internal Medicine

## 2022-04-05 ENCOUNTER — Encounter: Payer: Self-pay | Admitting: Internal Medicine

## 2022-04-05 VITALS — BP 118/60 | HR 82 | Temp 97.8°F | Resp 20 | Wt 341.6 lb

## 2022-04-05 VITALS — BP 147/54 | HR 69

## 2022-04-05 DIAGNOSIS — Z5111 Encounter for antineoplastic chemotherapy: Secondary | ICD-10-CM | POA: Diagnosis not present

## 2022-04-05 DIAGNOSIS — C541 Malignant neoplasm of endometrium: Secondary | ICD-10-CM | POA: Diagnosis not present

## 2022-04-05 LAB — COMPREHENSIVE METABOLIC PANEL
ALT: 21 U/L (ref 0–44)
AST: 20 U/L (ref 15–41)
Albumin: 3.3 g/dL — ABNORMAL LOW (ref 3.5–5.0)
Alkaline Phosphatase: 54 U/L (ref 38–126)
Anion gap: 7 (ref 5–15)
BUN: 14 mg/dL (ref 8–23)
CO2: 25 mmol/L (ref 22–32)
Calcium: 8.3 mg/dL — ABNORMAL LOW (ref 8.9–10.3)
Chloride: 105 mmol/L (ref 98–111)
Creatinine, Ser: 0.64 mg/dL (ref 0.44–1.00)
GFR, Estimated: >60 mL/min (ref >60)
Glucose, Bld: 149 mg/dL — ABNORMAL HIGH (ref 70–99)
Potassium: 3.6 mmol/L (ref 3.5–5.1)
Sodium: 137 mmol/L (ref 135–145)
Total Bilirubin: 0.5 mg/dL (ref 0.3–1.2)
Total Protein: 6.9 g/dL (ref 6.5–8.1)

## 2022-04-05 LAB — CBC WITH DIFFERENTIAL/PLATELET
Abs Immature Granulocytes: 0.03 10*3/uL (ref 0.00–0.07)
Basophils Absolute: 0 10*3/uL (ref 0.0–0.1)
Basophils Relative: 1 %
Eosinophils Absolute: 0 10*3/uL (ref 0.0–0.5)
Eosinophils Relative: 1 %
HCT: 32.7 % — ABNORMAL LOW (ref 36.0–46.0)
Hemoglobin: 10.7 g/dL — ABNORMAL LOW (ref 12.0–15.0)
Immature Granulocytes: 1 %
Lymphocytes Relative: 18 %
Lymphs Abs: 0.7 10*3/uL (ref 0.7–4.0)
MCH: 27.9 pg (ref 26.0–34.0)
MCHC: 32.7 g/dL (ref 30.0–36.0)
MCV: 85.2 fL (ref 80.0–100.0)
Monocytes Absolute: 0.7 10*3/uL (ref 0.1–1.0)
Monocytes Relative: 16 %
Neutro Abs: 2.6 10*3/uL (ref 1.7–7.7)
Neutrophils Relative %: 63 %
Platelets: 184 10*3/uL (ref 150–400)
RBC: 3.84 MIL/uL — ABNORMAL LOW (ref 3.87–5.11)
RDW: 14.7 % (ref 11.5–15.5)
WBC: 4.1 10*3/uL (ref 4.0–10.5)
nRBC: 0 % (ref 0.0–0.2)

## 2022-04-05 MED ORDER — MORPHINE SULFATE 15 MG PO TABS: 15.0000 mg | ORAL_TABLET | Freq: Four times a day (QID) | ORAL | 0 refills | Status: AC | PRN

## 2022-04-05 MED ORDER — HEPARIN SOD (PORK) LOCK FLUSH 100 UNIT/ML IV SOLN
500.0000 [IU] | Freq: Once | INTRAVENOUS | Status: AC | PRN
  Filled 2022-04-05: qty 5

## 2022-04-05 MED ORDER — FAMOTIDINE IN NACL 20-0.9 MG/50ML-% IV SOLN
20.0000 mg | Freq: Once | INTRAVENOUS | Status: AC
  Administered 2022-04-05: 20 mg via INTRAVENOUS
  Filled 2022-04-05: qty 50

## 2022-04-05 MED ORDER — PALONOSETRON HCL INJECTION 0.25 MG/5ML
0.2500 mg | Freq: Once | INTRAVENOUS | Status: AC
  Administered 2022-04-05: 0.25 mg via INTRAVENOUS
  Filled 2022-04-05: qty 5

## 2022-04-05 MED ORDER — HEPARIN SOD (PORK) LOCK FLUSH 100 UNIT/ML IV SOLN
INTRAVENOUS | Status: AC
  Administered 2022-04-05: 500 [IU]
  Filled 2022-04-05: qty 5

## 2022-04-05 MED ORDER — SODIUM CHLORIDE 0.9 % IV SOLN
135.0000 mg/m2 | Freq: Once | INTRAVENOUS | Status: AC
  Administered 2022-04-05: 354 mg via INTRAVENOUS
  Filled 2022-04-05: qty 59

## 2022-04-05 MED ORDER — SODIUM CHLORIDE 0.9 % IV SOLN
150.0000 mg | Freq: Once | INTRAVENOUS | Status: AC
  Administered 2022-04-05: 150 mg via INTRAVENOUS
  Filled 2022-04-05: qty 150

## 2022-04-05 MED ORDER — SODIUM CHLORIDE 0.9 % IV SOLN
Freq: Once | INTRAVENOUS | Status: AC
  Filled 2022-04-05: qty 250

## 2022-04-05 MED ORDER — SODIUM CHLORIDE 0.9 % IV SOLN
600.0000 mg | Freq: Once | INTRAVENOUS | Status: AC
  Administered 2022-04-05: 600 mg via INTRAVENOUS
  Filled 2022-04-05: qty 60

## 2022-04-05 MED ORDER — SODIUM CHLORIDE 0.9 % IV SOLN
200.0000 mg | Freq: Once | INTRAVENOUS | Status: AC
  Administered 2022-04-05: 200 mg via INTRAVENOUS
  Filled 2022-04-05: qty 8

## 2022-04-05 MED ORDER — DIPHENHYDRAMINE HCL 50 MG/ML IJ SOLN
50.0000 mg | Freq: Once | INTRAMUSCULAR | Status: AC
  Administered 2022-04-05: 50 mg via INTRAVENOUS
  Filled 2022-04-05: qty 1

## 2022-04-05 MED ORDER — SODIUM CHLORIDE 0.9 % IV SOLN
10.0000 mg | Freq: Once | INTRAVENOUS | Status: AC
  Administered 2022-04-05: 10 mg via INTRAVENOUS
  Filled 2022-04-05: qty 10

## 2022-04-05 NOTE — Patient Instructions (Signed)
[facility]  Discharge Instructions: Thank you for choosing [facility] to provide your oncology and hematology care.  If you have a lab appointment with the Cancer Center, please go directly to the Cancer Center and check in at the registration area.  Wear comfortable clothing and clothing appropriate for easy access to any Portacath or PICC line.   We strive to give you quality time with your provider. You may need to reschedule your appointment if you arrive late (15 or more minutes).  Arriving late affects you and other patients whose appointments are after yours.  Also, if you miss three or more appointments without notifying the office, you may be dismissed from the clinic at the provider's discretion.      For prescription refill requests, have your pharmacy contact our office and allow 72 hours for refills to be completed.    Today you received the following chemotherapy and/or immunotherapy agents Keytruda, Taxol and Carboplatin       To help prevent nausea and vomiting after your treatment, we encourage you to take your nausea medication as directed.  BELOW ARE SYMPTOMS THAT SHOULD BE REPORTED IMMEDIATELY: *FEVER GREATER THAN 100.4 F (38 C) OR HIGHER *CHILLS OR SWEATING *NAUSEA AND VOMITING THAT IS NOT CONTROLLED WITH YOUR NAUSEA MEDICATION *UNUSUAL SHORTNESS OF BREATH *UNUSUAL BRUISING OR BLEEDING *URINARY PROBLEMS (pain or burning when urinating, or frequent urination) *BOWEL PROBLEMS (unusual diarrhea, constipation, pain near the anus) TENDERNESS IN MOUTH AND THROAT WITH OR WITHOUT PRESENCE OF ULCERS (sore throat, sores in mouth, or a toothache) UNUSUAL RASH, SWELLING OR PAIN  UNUSUAL VAGINAL DISCHARGE OR ITCHING   Items with * indicate a potential emergency and should be followed up as soon as possible or go to the Emergency Department if any problems should occur.  Please show the CHEMOTHERAPY ALERT CARD or IMMUNOTHERAPY  ALERT CARD at check-in to the Emergency Department and triage nurse.  Should you have questions after your visit or need to cancel or reschedule your appointment, please contact [facility]  [phone] and follow the prompts.  Office hours are 8:00 a.m. to 4:30 p.m. Monday - Friday. Please note that voicemails left after 4:00 p.m. may not be returned until the following business day.  We are closed weekends and major holidays. You have access to a nurse at all times for urgent questions. Please call the main number to the clinic [phone] and follow the prompts.  For any non-urgent questions, you may also contact your provider using MyChart. We now offer e-Visits for anyone 18 and older to request care online for non-urgent symptoms. For details visit [email or web address].   Also download the MyChart app! Go to the app store, search "MyChart", open the app, select [facility], and log in with your MyChart username and password.  Masks are optional in the cancer centers. If you would like for your care team to wear a mask while they are taking care of you, please let them know. For doctor visits, patients may have with them one support person who is at least 71 years old. At this time, visitors are not allowed in the infusion area.   

## 2022-04-05 NOTE — Progress Notes (Signed)
Pt having restless leg post Benadryl. Pt also reports "my stomach feels hard". Dr. Darrall Dears aware and at chairside to assess. Per Dr. Darrall Dears okay to proceed with treatment and pt to receive PO Benadryl next treatment.

## 2022-04-05 NOTE — Progress Notes (Signed)
[place] OFFICE PROGRESS NOTE  Patient Care Team: Sofie Hartigan, MD as PCP - General (Family Medicine) Clent Jacks, RN as Oncology Nurse Navigator [place], [place], MD as Referring Physician (Gynecologic Oncology) Noreene Filbert, MD as Consulting Physician (Radiation Oncology) Shirline Frees, MD as Referring Physician (Radiation Oncology) Lonia Farber, MD as Consulting Physician (Endocrinology)  CURRENT TREATMENT-  Carboplatin AUC 4 and Paclitaxel 135 mg/m2 q21 days started on 02/24/2022. Pembrolizumab added with cycle 2  ASSESSMENT & PLAN:  Kathryn Maddox is a 71 yo F with pmh of morbid obesity, HTN and HLD was diagnosed with endometrioid cancer, grade 1, staged 1A per imaging in Feb 2022, referred to medical oncology to discuss about systemic treatment options upon progression.    #Endometrioid cancer, Stage III per imaging, pMMR and p53 wildtype - diagnosed with stage 1A grade 1 endometrioid cancer in Feb 2022 after post menopausal bleeding.  - not a surgical candidate due to morbid obesity  - s/p Mirena IUD with persistance of disease evident on repeat EMBx done in 10/22  - could not tolerate Norethindrone  - S/p EBRT at [place] followed by 4 fractions of vaginal brachytherapy at [place] completed on 10/04/2021.  - PET/CT from 01/19/2022 showed focal area of increased FDG activity in fundus of the uterus and right pelvic and presacral lymph nodes concerning for nodal metastases.    -Patient seen today prior to cycle 3 of carboplatin, Taxol and pembrolizumab.  Labs reviewed and acceptable.  After IV Benadryl for premedication, she developed restlessness in her bilateral lower extremity and was feeling hard in her stomach.  Patient was evaluated in the infusion room and her symptoms had mostly resolved by the time I saw her.  Vitals showed BP in the 160s otherwise normal.  For next cycle, we will change IV to p.o. Benadryl.  After chemo she develops  pains in her body for few days which responds to morphine.  Morphine 15 mg every 6 as needed 30 pills refilled. -TSH pending  #Abdominal pelvic pain,  -controlled.  Could be from prior radiation -continue morphine 15 mg q6prn.   #Neuropathy from long standing diabetes -closely monitor on taxol.  Manageable on gabapentin 200 mg at night. -using cryotherapy mittens and socks today.  # access - port   RTC in 1 week for MD visit, labs In 3 weeks for MD visit, cycle 4 with NP and labs.  Orders Placed This Encounter  Procedures   CBC with Differential    Standing Status:   Future    Standing Expiration Date:   04/27/2023   Comprehensive metabolic panel    Standing Status:   Future    Standing Expiration Date:   04/27/2023   Thyroid Panel With TSH    Standing Status:   Future    Number of Occurrences:   1    Standing Expiration Date:   04/06/2023    All questions were answered. The patient knows to call the clinic with any problems, questions or concerns. The total time spent in the appointment was 30 minutes encounter with patients including review of chart and various tests results, discussions about plan of care and coordination of care plan   [name], MD 04/05/2022 11:06 AM  INTERVAL HISTORY: [name] with diagnosis of stage III endometrioid cancer s/p Mirena IUD, could not tolerate norethindrone, EBRT and vaginal brachytherapy now on systemic treatment.  She was seen today prior to cycle 3 of [place],  Taxol and pembrolizumab.  She does have diffuse body aches after chemotherapy which lasts for about 10 days and responds to morphine.  Her solid intake is low but keeping herself well-hydrated.  She is having episodes of constipation.  At times her bowel movements are soft.  Reports her neuropathy is at baseline.  She takes gabapentin 200 mg at night which is helping.  REVIEW OF SYSTEMS:   Pertinent positive ROS as above. All other systems were reviewed  with the patient and are negative.  I have reviewed the past medical history, past surgical history, social history and family history with the patient and they are unchanged from previous note.  ALLERGIES:  is allergic to megace [megestrol], penicillins, ibuprofen, and pravastatin.  MEDICATIONS:  Current Outpatient Medications  Medication Sig Dispense Refill   dexamethasone (DECADRON) 4 MG tablet Take 2 tablets (8 mg total) by mouth daily for 3 days. Start the day after chemotherapy. Take with food. 30 tablet 1   Easy Touch Lancets 30G/Twist MISC      gabapentin (NEURONTIN) 100 MG capsule Take 2 capsules (200 mg total) by mouth 2 (two) times daily. 120 capsule 1   lidocaine-prilocaine (EMLA) cream Apply 1 Application topically as needed. Apply to port 1 hour prior to chemotherapy. Cover with plastic wrap. 30 g 2   losartan (COZAAR) 50 MG tablet Take 50 mg by mouth daily.     morphine (MSIR) 15 MG tablet Take 1 tablet (15 mg total) by mouth every 6 (six) hours as needed for up to 8 days for severe pain. 30 tablet 0   tirzepatide (MOUNJARO) 5 MG/0.5ML Pen INJECT 5 MG UNDER THE SKIN EVERY 7 DAYS     ondansetron (ZOFRAN) 8 MG tablet Take 1 tablet (8 mg total) by mouth every 8 (eight) hours as needed for nausea or vomiting. Start on the third day after chemotherapy. (Patient not taking: Reported on 03/15/2022) 30 tablet 1   prochlorperazine (COMPAZINE) 10 MG tablet Take 1 tablet (10 mg total) by mouth every 6 (six) hours as needed for nausea or vomiting. (Patient not taking: Reported on 03/15/2022) 30 tablet 1   No current facility-administered medications for this visit.   Facility-Administered Medications Ordered in Other Visits  Medication Dose Route Frequency Provider Last Rate Last Admin   CARBOplatin (PARAPLATIN) 600 mg in sodium chloride 0.9 % 250 mL chemo infusion  600 mg Intravenous Once [name], MD       heparin lock flush 100 UNIT/ML injection            heparin lock flush 100  unit/mL  500 Units Intracatheter Once PRN [name], MD       PACLitaxel (TAXOL) 354 mg in sodium chloride 0.9 % 500 mL chemo infusion (> $RemoveBef'80mg'SxwCvwRGWJ$ /m2)  135 mg/m2 (Treatment Plan Recorded) Intravenous Once [name], MD       pembrolizumab [facility]) 200 mg in sodium chloride 0.9 % 50 mL chemo infusion  200 mg Intravenous Once [name], MD 116 mL/hr at 04/05/22 1100 200 mg at 04/05/22 1100    SUMMARY OF ONCOLOGIC HISTORY: Oncology History  Endometrial cancer ([place])  07/30/2020 Initial Diagnosis   She was seen by Dr. Leonides Schanz of OBGYN for post menopausal bleeding x 7-8 years.    08/14/2020 Imaging   Pelvic US showed  FINDINGS: Uterus: Measurements: 7.7 x 5.3 x 5.8 cm = volume: 123 mL. No fibroids or other mass visualized. Endometrium: Thickness: 10 mm.  No focal abnormality visualized. Right ovary: Nonvisualized. Left ovary:  Nonvisualized. Other findings: No abnormal free fluid.   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 No or minimal myometrial invasion, less than 50%, suggesting stage 1a disease by imaging.  -She was not a surgical candidate due to morbid obesity.  -S/p IUD Mirena by Dr. Leafy Ro 4/22 - Repeat EMBx on 03/24/21 showed persistent grade 1 endometrioid cancer.  - Unable to tolerate Norethidrone and IUD expelled.       Radiation Therapy   - S/p EBRT at [place] followed by 4 fractions of vaginal brachytherapy at [place] completed on 7/82/4235.   - complicated by radiation cystitis and diarrhea. Was seen by Dr. Delaine Lame of ID. Diagnosed with VRE colonization. Symptoms have largely improved since.     01/19/2022 Progression   PET scan performed at [facility]-  IMPRESSION:  1.  Focal area of markedly increased FDG activity in the fundus of the uterus which may represent uterine cancer  2.  Subcentimeter right pelvic and presacral lymph nodes with intense FDG activity concerning for nodal  metastases.    02/24/2022 -  Chemotherapy   Patient is on Treatment Plan :  Dose reduced.   Carboplatin AUC 4 + Paclitaxel 135 mg/m2 q21d due to functional status and pre-existing peripheral neuropathy.        PHYSICAL EXAMINATION: ECOG PERFORMANCE STATUS: 2 - Symptomatic, <50% confined to bed  Vitals:   04/05/22 0842  BP: 118/60  Pulse: 82  Resp: 20  Temp: 97.8 F (36.6 C)  SpO2: 97%    Filed Weights   04/05/22 0842  Weight: (!) 341 lb 9.6 oz (154.9 kg)     GENERAL:alert, no distress and comfortable SKIN: skin color, texture, turgor are normal, no rashes or significant lesions EYES: normal, Conjunctiva are pink and non-injected, sclera clear OROPHARYNX:no exudate, no erythema and lips, buccal mucosa, and tongue normal  NECK: supple, thyroid normal size, non-tender, without nodularity LYMPH:  no palpable lymphadenopathy in the cervical, axillary or inguinal LUNGS: clear to auscultation and percussion with normal breathing effort HEART: regular rate & rhythm and no murmurs and no lower extremity edema ABDOMEN:abdomen soft, non-tender and normal bowel sounds Musculoskeletal:no cyanosis of digits and no clubbing  NEURO: alert & oriented x 3 with fluent speech, no focal motor/sensory deficits  LABORATORY DATA:  I have reviewed the data as listed    Component Value Date/Time   NA 137 04/05/2022 0832   K 3.6 04/05/2022 0832   CL 105 04/05/2022 0832   CO2 25 04/05/2022 0832   GLUCOSE 149 (H) 04/05/2022 0832   BUN 14 04/05/2022 0832   CREATININE 0.64 04/05/2022 0832   CALCIUM 8.3 (L) 04/05/2022 0832   PROT 6.9 04/05/2022 0832   ALBUMIN 3.3 (L) 04/05/2022 0832   AST 20 04/05/2022 0832   ALT 21 04/05/2022 0832   ALKPHOS 54 04/05/2022 0832   BILITOT 0.5 04/05/2022 0832   GFRNONAA >60 04/05/2022 0832    No results found for: "SPEP", "UPEP"  Lab Results  Component Value Date   WBC 4.1 04/05/2022   NEUTROABS 2.6 04/05/2022   HGB 10.7 (L) 04/05/2022   HCT 32.7 (L)  04/05/2022   MCV 85.2 04/05/2022   PLT 184 04/05/2022      Chemistry      Component Value Date/Time   NA 137 04/05/2022 0832   K 3.6 04/05/2022 0832   CL 105 04/05/2022 0832   CO2 25 04/05/2022 0832   BUN 14 04/05/2022 3614  CREATININE 0.64 04/05/2022 0832      Component Value Date/Time   CALCIUM 8.3 (L) 04/05/2022 0832   ALKPHOS 54 04/05/2022 0832   AST 20 04/05/2022 0832   ALT 21 04/05/2022 0832   BILITOT 0.5 04/05/2022 0832       RADIOGRAPHIC STUDIES: I have personally reviewed the radiological images as listed and agreed with the findings in the report. No results found.

## 2022-04-06 ENCOUNTER — Ambulatory Visit: Payer: HMO | Admitting: Radiation Oncology

## 2022-04-06 LAB — THYROID PANEL WITH TSH
Free Thyroxine Index: 3 (ref 1.2–4.9)
T3 Uptake Ratio: 31 % (ref 24–39)
T4, Total: 9.8 ug/dL (ref 4.5–12.0)
TSH: 1.63 u[IU]/mL (ref 0.450–4.500)

## 2022-04-11 ENCOUNTER — Other Ambulatory Visit: Payer: Self-pay

## 2022-04-11 DIAGNOSIS — C541 Malignant neoplasm of endometrium: Secondary | ICD-10-CM

## 2022-04-12 ENCOUNTER — Encounter: Payer: Self-pay | Admitting: Internal Medicine

## 2022-04-12 ENCOUNTER — Inpatient Hospital Stay: Payer: HMO

## 2022-04-12 ENCOUNTER — Inpatient Hospital Stay (HOSPITAL_BASED_OUTPATIENT_CLINIC_OR_DEPARTMENT_OTHER): Payer: HMO | Admitting: Internal Medicine

## 2022-04-12 VITALS — BP 141/71 | HR 88 | Temp 96.7°F | Resp 20 | Wt 331.2 lb

## 2022-04-12 DIAGNOSIS — G629 Polyneuropathy, unspecified: Secondary | ICD-10-CM

## 2022-04-12 DIAGNOSIS — C541 Malignant neoplasm of endometrium: Secondary | ICD-10-CM

## 2022-04-12 DIAGNOSIS — Z5111 Encounter for antineoplastic chemotherapy: Secondary | ICD-10-CM | POA: Diagnosis not present

## 2022-04-12 LAB — CBC WITH DIFFERENTIAL/PLATELET
Abs Immature Granulocytes: 0 10*3/uL (ref 0.00–0.07)
Basophils Absolute: 0 10*3/uL (ref 0.0–0.1)
Basophils Relative: 0 %
Eosinophils Absolute: 0 10*3/uL (ref 0.0–0.5)
Eosinophils Relative: 2 %
HCT: 34.6 % — ABNORMAL LOW (ref 36.0–46.0)
Hemoglobin: 11.2 g/dL — ABNORMAL LOW (ref 12.0–15.0)
Lymphocytes Relative: 37 %
Lymphs Abs: 0.6 10*3/uL — ABNORMAL LOW (ref 0.7–4.0)
MCH: 27.5 pg (ref 26.0–34.0)
MCHC: 32.4 g/dL (ref 30.0–36.0)
MCV: 85 fL (ref 80.0–100.0)
Monocytes Absolute: 0.1 10*3/uL (ref 0.1–1.0)
Monocytes Relative: 3 %
Neutro Abs: 1 10*3/uL — ABNORMAL LOW (ref 1.7–7.7)
Neutrophils Relative %: 58 %
Platelets: 149 10*3/uL — ABNORMAL LOW (ref 150–400)
RBC: 4.07 MIL/uL (ref 3.87–5.11)
RDW: 14.1 % (ref 11.5–15.5)
Smear Review: NORMAL
WBC: 1.7 10*3/uL — ABNORMAL LOW (ref 4.0–10.5)
nRBC: 0 % (ref 0.0–0.2)

## 2022-04-12 LAB — COMPREHENSIVE METABOLIC PANEL
ALT: 41 U/L (ref 0–44)
AST: 35 U/L (ref 15–41)
Albumin: 3.5 g/dL (ref 3.5–5.0)
Alkaline Phosphatase: 51 U/L (ref 38–126)
Anion gap: 7 (ref 5–15)
BUN: 15 mg/dL (ref 8–23)
CO2: 25 mmol/L (ref 22–32)
Calcium: 8.4 mg/dL — ABNORMAL LOW (ref 8.9–10.3)
Chloride: 101 mmol/L (ref 98–111)
Creatinine, Ser: 0.62 mg/dL (ref 0.44–1.00)
GFR, Estimated: >60 mL/min (ref >60)
Glucose, Bld: 165 mg/dL — ABNORMAL HIGH (ref 70–99)
Potassium: 3.6 mmol/L (ref 3.5–5.1)
Sodium: 133 mmol/L — ABNORMAL LOW (ref 135–145)
Total Bilirubin: 1.1 mg/dL (ref 0.3–1.2)
Total Protein: 7.1 g/dL (ref 6.5–8.1)

## 2022-04-12 NOTE — Progress Notes (Unsigned)
Patient states she is feeling weak and has no appetite.

## 2022-04-13 ENCOUNTER — Encounter: Payer: Self-pay | Admitting: Internal Medicine

## 2022-04-13 MED ORDER — DULOXETINE HCL 30 MG PO CPEP: 30.0000 mg | ORAL_CAPSULE | Freq: Every day | ORAL | 0 refills | Status: DC

## 2022-04-13 NOTE — Progress Notes (Signed)
[facility] OFFICE PROGRESS NOTE  Patient Care Team: Sofie Hartigan, MD as PCP - General (Family Medicine) Clent Jacks, RN as Oncology Nurse Navigator [place], [place], MD as Referring Physician (Gynecologic Oncology) Noreene Filbert, MD as Consulting Physician (Radiation Oncology) Shirline Frees, MD as Referring Physician (Radiation Oncology) Lonia Farber, MD as Consulting Physician (Endocrinology)  CURRENT TREATMENT-  Carboplatin AUC 4 and Paclitaxel 135 mg/m2 q21 days started on 02/24/2022. Pembrolizumab added with cycle 2  ASSESSMENT & PLAN:  Kathryn Maddox is a 71 yo F with pmh of morbid obesity, HTN and HLD was diagnosed with endometrioid cancer, grade 1, staged 1A per imaging in Feb 2022, referred to medical oncology to discuss about systemic treatment options upon progression.    #Endometrioid cancer, Stage III per imaging, pMMR and p53 wildtype - diagnosed with stage 1A grade 1 endometrioid cancer in Feb 2022 after post menopausal bleeding.  - not a surgical candidate due to morbid obesity  - s/p Mirena IUD with persistance of disease evident on repeat EMBx done in 10/22  - could not tolerate Norethindrone  - S/p EBRT at [facility] [facility] followed by 4 fractions of vaginal brachytherapy at [facility] completed on 10/04/2021.  - PET/CT from 01/19/2022 showed focal area of increased FDG activity in fundus of the uterus and right pelvic and presacral lymph nodes concerning for nodal metastases.    -Patient seen today midcycle 3 of CarboTaxol.  She has been experiencing extreme fatigue, generalized weakness and diffuse pain with chemotherapy.  Pain feels better with morphine. neuropathy is worse in bilateral hands and feet.  It responds to gabapentin however she has not been able to take it because it upsets her stomach even with food.  I will change to Cymbalta and see how she tolerates.  Patient is having difficulty tolerating chemotherapy due to extreme fatigue  and does not think can continue for total 6 cycles.  So we will do 4 cycles and then maintenance pembrolizumab. 15% dose reduction for taxol with cycle 4. Chemo orders signed. She has PET scan scheduled after cycle 4 and has follow-up with Dr. Theora Gianotti.   Today, WBC 1.7, ANC 1000, hemoglobin 11.2 and platelets 149.  Sodium 133 otherwise unremarkable.  #Abdominal pelvic pain,  -controlled.  Could be from prior radiation -continue morphine 15 mg q6prn.   #Neuropathy from long standing diabetes - worsening on Taxol. Gabapentin helps but she is not able to tolerate it due to upset stomach even when she takes with food.  Discussed about changing to Cymbalta 30 mg daily and see how she tolerates.  Patient was agreeable. -Not using cryotherapy mittens or socks.  # access - port   RTC in 2 weeks for cycle 4 with NP and labs. RTC on 11/15 for md visit, labs   Orders Placed This Encounter  Procedures   CBC with Differential    Standing Status:   Future    Standing Expiration Date:   27/25/3664   Basic metabolic panel    Standing Status:   Future    Standing Expiration Date:   04/13/2023    All questions were answered. The patient knows to call the clinic with any problems, questions or concerns. The total time spent in the appointment was 30 minutes encounter with patients including review of chart and various tests results, discussions about plan of care and coordination of care plan   [name], MD 04/13/2022 10:46 AM  INTERVAL HISTORY: [name] with  diagnosis of stage III endometrioid cancer s/p Mirena IUD, could not tolerate norethindrone, EBRT and vaginal brachytherapy now on systemic treatment.  She was seen today mid cycle 3 of carbo Taxol.  Patient has been having extreme fatigue and generalized weakness with chemotherapy.  Shealso reports diffuse pain in her body which responds to morphine.  Her neuropathy in her hands and feet is getting worse.  It gets  better with gabapentin however she has not been able to take it upsets her stomach even when she takes with food.  Denies any nausea or vomiting.  Her appetite is poor and not able to eat much solid food.  She has been taking liquids. Bowel movements fluctuates between constipation and soft.   REVIEW OF SYSTEMS:   Pertinent positive ROS as above. All other systems were reviewed with the patient and are negative.  I have reviewed the past medical history, past surgical history, social history and family history with the patient and they are unchanged from previous note.  ALLERGIES:  is allergic to megace [megestrol], penicillins, ibuprofen, and pravastatin.  MEDICATIONS:  Current Outpatient Medications  Medication Sig Dispense Refill   dexamethasone (DECADRON) 4 MG tablet Take 2 tablets (8 mg total) by mouth daily for 3 days. Start the day after chemotherapy. Take with food. 30 tablet 1   Easy Touch Lancets 30G/Twist MISC      gabapentin (NEURONTIN) 100 MG capsule Take 2 capsules (200 mg total) by mouth 2 (two) times daily. 120 capsule 1   lidocaine-prilocaine (EMLA) cream Apply 1 Application topically as needed. Apply to port 1 hour prior to chemotherapy. Cover with plastic wrap. 30 g 2   losartan (COZAAR) 50 MG tablet Take 50 mg by mouth daily.     morphine (MSIR) 15 MG tablet Take 1 tablet (15 mg total) by mouth every 6 (six) hours as needed for up to 8 days for severe pain. 30 tablet 0   tirzepatide (MOUNJARO) 5 MG/0.5ML Pen INJECT 5 MG UNDER THE SKIN EVERY 7 DAYS     ondansetron (ZOFRAN) 8 MG tablet Take 1 tablet (8 mg total) by mouth every 8 (eight) hours as needed for nausea or vomiting. Start on the third day after chemotherapy. (Patient not taking: Reported on 03/15/2022) 30 tablet 1   prochlorperazine (COMPAZINE) 10 MG tablet Take 1 tablet (10 mg total) by mouth every 6 (six) hours as needed for nausea or vomiting. (Patient not taking: Reported on 03/15/2022) 30 tablet 1   No current  facility-administered medications for this visit.    SUMMARY OF ONCOLOGIC HISTORY: Oncology History  Endometrial cancer ([facility])  07/30/2020 Initial Diagnosis   She was seen by Dr. Leonides Schanz of OBGYN for post menopausal bleeding x 7-8 years.    08/14/2020 Imaging   Pelvic US showed  FINDINGS: Uterus: Measurements: 7.7 x 5.3 x 5.8 cm = volume: 123 mL. No fibroids or other mass visualized. Endometrium: Thickness: 10 mm.  No focal abnormality visualized. Right ovary: Nonvisualized. Left ovary: Nonvisualized. Other findings: No abnormal free fluid.   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 No or minimal myometrial invasion, less than 50%, suggesting stage 1a disease by imaging.  -She was not a surgical candidate due to morbid obesity.  -S/p IUD Mirena by Dr. Leafy Ro 4/22 - Repeat EMBx on 03/24/21 showed persistent grade 1 endometrioid cancer.  - Unable to tolerate Norethidrone and IUD expelled.       Radiation  Therapy   - S/p EBRT at [facility] followed by 4 fractions of vaginal brachytherapy at [facility] completed on 2/80/0349.   - complicated by radiation cystitis and diarrhea. Was seen by Dr. Delaine Lame of ID. Diagnosed with VRE colonization. Symptoms have largely improved since.     01/19/2022 Progression   PET scan performed at [facility]-  IMPRESSION:  1.  Focal area of markedly increased FDG activity in the fundus of the uterus which may represent uterine cancer  2.  Subcentimeter right pelvic and presacral lymph nodes with intense FDG activity concerning for nodal metastases.    02/24/2022 -  Chemotherapy   Patient is on Treatment Plan :  Dose reduced.   Carboplatin AUC 4 + Paclitaxel 135 mg/m2 q21d due to functional status and pre-existing peripheral neuropathy.        PHYSICAL EXAMINATION: ECOG PERFORMANCE STATUS: 2 - Symptomatic, <50% confined to bed  Vitals:   04/12/22 1006  BP: (!) 141/71  Pulse: 88  Resp:  20  Temp: (!) 96.7 F (35.9 C)  SpO2: 100%    Filed Weights   04/12/22 1006  Weight: (!) 331 lb 3.2 oz (150.2 kg)     GENERAL:alert, no distress and comfortable SKIN: skin color, texture, turgor are normal, no rashes or significant lesions EYES: normal, Conjunctiva are pink and non-injected, sclera clear OROPHARYNX:no exudate, no erythema and lips, buccal mucosa, and tongue normal  NECK: supple, thyroid normal size, non-tender, without nodularity LYMPH:  no palpable lymphadenopathy in the cervical, axillary or inguinal LUNGS: clear to auscultation and percussion with normal breathing effort HEART: regular rate & rhythm and no murmurs and no lower extremity edema ABDOMEN:abdomen soft, non-tender and normal bowel sounds Musculoskeletal:no cyanosis of digits and no clubbing  NEURO: alert & oriented x 3 with fluent speech, no focal motor/sensory deficits  LABORATORY DATA:  I have reviewed the data as listed    Component Value Date/Time   NA 133 (L) 04/12/2022 0949   K 3.6 04/12/2022 0949   CL 101 04/12/2022 0949   CO2 25 04/12/2022 0949   GLUCOSE 165 (H) 04/12/2022 0949   BUN 15 04/12/2022 0949   CREATININE 0.62 04/12/2022 0949   CALCIUM 8.4 (L) 04/12/2022 0949   PROT 7.1 04/12/2022 0949   ALBUMIN 3.5 04/12/2022 0949   AST 35 04/12/2022 0949   ALT 41 04/12/2022 0949   ALKPHOS 51 04/12/2022 0949   BILITOT 1.1 04/12/2022 0949   GFRNONAA >60 04/12/2022 0949    No results found for: "SPEP", "UPEP"  Lab Results  Component Value Date   WBC 1.7 (L) 04/12/2022   NEUTROABS 1.0 (L) 04/12/2022   HGB 11.2 (L) 04/12/2022   HCT 34.6 (L) 04/12/2022   MCV 85.0 04/12/2022   PLT 149 (L) 04/12/2022      Chemistry      Component Value Date/Time   NA 133 (L) 04/12/2022 0949   K 3.6 04/12/2022 0949   CL 101 04/12/2022 0949   CO2 25 04/12/2022 0949   BUN 15 04/12/2022 0949   CREATININE 0.62 04/12/2022 0949      Component Value Date/Time   CALCIUM 8.4 (L) 04/12/2022 0949    ALKPHOS 51 04/12/2022 0949   AST 35 04/12/2022 0949   ALT 41 04/12/2022 0949   BILITOT 1.1 04/12/2022 0949       RADIOGRAPHIC STUDIES: I have personally reviewed the radiological images as listed and agreed with the findings in the report. No results found.

## 2022-04-18 ENCOUNTER — Telehealth: Payer: Self-pay | Admitting: *Deleted

## 2022-04-18 NOTE — Telephone Encounter (Signed)
Patient called stating hat the Duloxetine is doing the same thing that the Gabapentin did with causing her stomach to burn really bad,. She does not want to take it and is asking what to do. Please advise

## 2022-04-19 ENCOUNTER — Encounter: Payer: Self-pay | Admitting: Internal Medicine

## 2022-04-26 ENCOUNTER — Encounter: Payer: Self-pay | Admitting: Medical Oncology

## 2022-04-26 ENCOUNTER — Inpatient Hospital Stay: Payer: HMO | Attending: Obstetrics and Gynecology

## 2022-04-26 ENCOUNTER — Inpatient Hospital Stay: Payer: HMO

## 2022-04-26 ENCOUNTER — Inpatient Hospital Stay (HOSPITAL_BASED_OUTPATIENT_CLINIC_OR_DEPARTMENT_OTHER): Payer: HMO | Admitting: Medical Oncology

## 2022-04-26 VITALS — BP 143/75 | HR 91 | Temp 97.8°F | Wt 331.0 lb

## 2022-04-26 VITALS — Resp 20

## 2022-04-26 DIAGNOSIS — I10 Essential (primary) hypertension: Secondary | ICD-10-CM | POA: Insufficient documentation

## 2022-04-26 DIAGNOSIS — Z79899 Other long term (current) drug therapy: Secondary | ICD-10-CM | POA: Diagnosis not present

## 2022-04-26 DIAGNOSIS — K5900 Constipation, unspecified: Secondary | ICD-10-CM

## 2022-04-26 DIAGNOSIS — R5383 Other fatigue: Secondary | ICD-10-CM | POA: Insufficient documentation

## 2022-04-26 DIAGNOSIS — R630 Anorexia: Secondary | ICD-10-CM | POA: Insufficient documentation

## 2022-04-26 DIAGNOSIS — N3040 Irradiation cystitis without hematuria: Secondary | ICD-10-CM | POA: Diagnosis not present

## 2022-04-26 DIAGNOSIS — E1142 Type 2 diabetes mellitus with diabetic polyneuropathy: Secondary | ICD-10-CM | POA: Diagnosis not present

## 2022-04-26 DIAGNOSIS — Z6841 Body Mass Index (BMI) 40.0 and over, adult: Secondary | ICD-10-CM | POA: Diagnosis not present

## 2022-04-26 DIAGNOSIS — Z5112 Encounter for antineoplastic immunotherapy: Secondary | ICD-10-CM | POA: Insufficient documentation

## 2022-04-26 DIAGNOSIS — R531 Weakness: Secondary | ICD-10-CM | POA: Diagnosis not present

## 2022-04-26 DIAGNOSIS — G629 Polyneuropathy, unspecified: Secondary | ICD-10-CM

## 2022-04-26 DIAGNOSIS — R1030 Lower abdominal pain, unspecified: Secondary | ICD-10-CM

## 2022-04-26 DIAGNOSIS — Z95828 Presence of other vascular implants and grafts: Secondary | ICD-10-CM | POA: Diagnosis not present

## 2022-04-26 DIAGNOSIS — C541 Malignant neoplasm of endometrium: Secondary | ICD-10-CM

## 2022-04-26 DIAGNOSIS — Z5111 Encounter for antineoplastic chemotherapy: Secondary | ICD-10-CM | POA: Diagnosis not present

## 2022-04-26 DIAGNOSIS — E860 Dehydration: Secondary | ICD-10-CM | POA: Insufficient documentation

## 2022-04-26 LAB — CBC WITH DIFFERENTIAL/PLATELET
Abs Immature Granulocytes: 0.04 10*3/uL (ref 0.00–0.07)
Basophils Absolute: 0 10*3/uL (ref 0.0–0.1)
Basophils Relative: 1 %
Eosinophils Absolute: 0 10*3/uL (ref 0.0–0.5)
Eosinophils Relative: 0 %
HCT: 29.8 % — ABNORMAL LOW (ref 36.0–46.0)
Hemoglobin: 9.7 g/dL — ABNORMAL LOW (ref 12.0–15.0)
Immature Granulocytes: 1 %
Lymphocytes Relative: 9 %
Lymphs Abs: 0.6 10*3/uL — ABNORMAL LOW (ref 0.7–4.0)
MCH: 27.9 pg (ref 26.0–34.0)
MCHC: 32.6 g/dL (ref 30.0–36.0)
MCV: 85.6 fL (ref 80.0–100.0)
Monocytes Absolute: 0.8 10*3/uL (ref 0.1–1.0)
Monocytes Relative: 13 %
Neutro Abs: 4.6 10*3/uL (ref 1.7–7.7)
Neutrophils Relative %: 76 %
Platelets: 178 10*3/uL (ref 150–400)
RBC: 3.48 MIL/uL — ABNORMAL LOW (ref 3.87–5.11)
RDW: 15.9 % — ABNORMAL HIGH (ref 11.5–15.5)
WBC: 6.1 10*3/uL (ref 4.0–10.5)
nRBC: 0 % (ref 0.0–0.2)

## 2022-04-26 LAB — COMPREHENSIVE METABOLIC PANEL
ALT: 16 U/L (ref 0–44)
AST: 19 U/L (ref 15–41)
Albumin: 3.3 g/dL — ABNORMAL LOW (ref 3.5–5.0)
Alkaline Phosphatase: 49 U/L (ref 38–126)
Anion gap: 9 (ref 5–15)
BUN: 11 mg/dL (ref 8–23)
CO2: 25 mmol/L (ref 22–32)
Calcium: 8.7 mg/dL — ABNORMAL LOW (ref 8.9–10.3)
Chloride: 103 mmol/L (ref 98–111)
Creatinine, Ser: 0.65 mg/dL (ref 0.44–1.00)
GFR, Estimated: >60 mL/min (ref >60)
Glucose, Bld: 144 mg/dL — ABNORMAL HIGH (ref 70–99)
Potassium: 3.4 mmol/L — ABNORMAL LOW (ref 3.5–5.1)
Sodium: 137 mmol/L (ref 135–145)
Total Bilirubin: 0.9 mg/dL (ref 0.3–1.2)
Total Protein: 6.9 g/dL (ref 6.5–8.1)

## 2022-04-26 MED ORDER — SODIUM CHLORIDE 0.9% FLUSH
10.0000 mL | INTRAVENOUS | Status: DC | PRN
  Administered 2022-04-26: 10 mL
  Filled 2022-04-26: qty 10

## 2022-04-26 MED ORDER — SODIUM CHLORIDE 0.9 % IV SOLN
115.0000 mg/m2 | Freq: Once | INTRAVENOUS | Status: AC
  Administered 2022-04-26: 300 mg via INTRAVENOUS
  Filled 2022-04-26: qty 50

## 2022-04-26 MED ORDER — HEPARIN SOD (PORK) LOCK FLUSH 100 UNIT/ML IV SOLN
INTRAVENOUS | Status: AC
  Filled 2022-04-26: qty 5

## 2022-04-26 MED ORDER — SODIUM CHLORIDE 0.9 % IV SOLN
10.0000 mg | Freq: Once | INTRAVENOUS | Status: AC
  Administered 2022-04-26: 10 mg via INTRAVENOUS
  Filled 2022-04-26: qty 10

## 2022-04-26 MED ORDER — SODIUM CHLORIDE 0.9 % IV SOLN
200.0000 mg | Freq: Once | INTRAVENOUS | Status: AC
  Administered 2022-04-26: 200 mg via INTRAVENOUS
  Filled 2022-04-26: qty 8

## 2022-04-26 MED ORDER — SODIUM CHLORIDE 0.9 % IV SOLN
600.0000 mg | Freq: Once | INTRAVENOUS | Status: AC
  Administered 2022-04-26: 600 mg via INTRAVENOUS
  Filled 2022-04-26: qty 60

## 2022-04-26 MED ORDER — SODIUM CHLORIDE 0.9 % IV SOLN
150.0000 mg | Freq: Once | INTRAVENOUS | Status: AC
  Administered 2022-04-26: 150 mg via INTRAVENOUS
  Filled 2022-04-26: qty 150

## 2022-04-26 MED ORDER — SODIUM CHLORIDE 0.9 % IV SOLN
Freq: Once | INTRAVENOUS | Status: AC
  Filled 2022-04-26: qty 250

## 2022-04-26 MED ORDER — DIPHENHYDRAMINE HCL 25 MG PO CAPS
50.0000 mg | ORAL_CAPSULE | Freq: Once | ORAL | Status: AC
  Administered 2022-04-26: 50 mg via ORAL
  Filled 2022-04-26: qty 2

## 2022-04-26 MED ORDER — HEPARIN SOD (PORK) LOCK FLUSH 100 UNIT/ML IV SOLN
500.0000 [IU] | Freq: Once | INTRAVENOUS | Status: AC | PRN
  Administered 2022-04-26: 500 [IU]
  Filled 2022-04-26: qty 5

## 2022-04-26 MED ORDER — FAMOTIDINE IN NACL 20-0.9 MG/50ML-% IV SOLN
20.0000 mg | Freq: Once | INTRAVENOUS | Status: AC
  Administered 2022-04-26: 20 mg via INTRAVENOUS
  Filled 2022-04-26: qty 50

## 2022-04-26 MED ORDER — PALONOSETRON HCL INJECTION 0.25 MG/5ML
0.2500 mg | Freq: Once | INTRAVENOUS | Status: AC
  Administered 2022-04-26: 0.25 mg via INTRAVENOUS
  Filled 2022-04-26: qty 5

## 2022-04-26 MED ORDER — IBUPROFEN 200 MG PO TABS
400.0000 mg | ORAL_TABLET | Freq: Once | ORAL | Status: AC
  Administered 2022-04-26: 400 mg via ORAL
  Filled 2022-04-26: qty 2

## 2022-04-26 NOTE — Progress Notes (Signed)
[facility] OFFICE PROGRESS NOTE  Patient Care Team: Kathryn Hartigan, MD as PCP - General (Family Medicine) Kathryn Jacks, RN as Oncology Nurse Navigator Kathryn Maddox, [place], MD as Referring Physician (Gynecologic Oncology) Kathryn Filbert, MD as Consulting Physician (Radiation Oncology) Kathryn Frees, MD as Referring Physician (Radiation Oncology) Kathryn Farber, MD as Consulting Physician (Endocrinology)  CURRENT TREATMENT-  Carboplatin AUC 4 and Paclitaxel 135 mg/m2 q21 days started on 02/24/2022. Pembrolizumab added with cycle 2  ASSESSMENT & PLAN:  Kathryn Maddox is a 71 yo F with pmh of morbid obesity, HTN and HLD was diagnosed with endometrioid cancer, grade 1, staged 1A per imaging in Feb 2022, referred to medical oncology to discuss about systemic treatment options upon progression.    #Endometrioid cancer, Stage III per imaging, pMMR and p53 wildtype - diagnosed with stage 1A grade 1 endometrioid cancer in Feb 2022 after post menopausal bleeding.  - not a surgical candidate due to morbid obesity  - s/p Mirena IUD with persistance of disease evident on repeat EMBx done in 10/22  - could not tolerate Norethindrone  - S/p EBRT at [facility] followed by 4 fractions of vaginal brachytherapy at [facility] completed on 10/04/2021.  - PET/CT from 01/19/2022 showed focal area of increased FDG activity in fundus of the uterus and right pelvic and presacral lymph nodes concerning for nodal metastases.   Constipation: New. Resolved with laxatives. Discussed use of Miralax as first time instead to avoid pelvic cramps with multiple laxatives.   INTERVAL HISTORY: [name] with diagnosis of stage III endometrioid cancer s/p Mirena IUD, could not tolerate norethindrone, EBRT and vaginal brachytherapy now on systemic treatment.  Today she reports that she is excited to get her 4th cycle of CarboTaxol completed.  She has been experiencing significant  fatigue, generalized weakness and diffuse pain with chemotherapy. She is hopeful that things will be better with the dose reduced treatment planned for today. She does mention that she had significant constipation over the end of this current cycle. She was having back and pelvic pain. Took 3 different laxatives yesterday and had successful bowel movements. Now feeling much better. No dysuria, urinary frequency, or further constipation.   Eating and drinking fairly well. Food does not taste great. Weight stable.   At her last visit she was switched from gabapentin to Cymbalta for her neuropathy. Unchanged today.   She has PET scan scheduled for tomorrow after cycle 4 and has follow-up with Kathryn Maddox.   Wt Readings from Last 3 Encounters:  04/26/22 (!) 331 lb (150.1 kg)  04/12/22 (!) 331 lb 3.2 oz (150.2 kg)  04/05/22 (!) 341 lb 9.6 oz (154.9 kg)     Labs reviewed and approved for treatment.   #Abdominal pelvic pain,  -controlled; chronic.  Thought to be from prior radiation vs constipation that has resolved  -continue morphine 15 mg q6prn.   #Neuropathy from long standing diabetes - worsening on Taxol. Unchanged with cymbalta. Agree with dose reduction of Taxol.   # access - port   Disposition  Today ok to proceed forward with Cycle 4 Day 1 dose reduced Carbo/taxol  RTC on 11/15 for md visit, labs   No orders of the defined types were placed in this encounter.   All questions were answered. The patient knows to call the clinic with any problems, questions or concerns. The total time spent in the appointment was 30 minutes encounter with patients including review of chart and  various tests results, discussions about plan of care and coordination of care plan   Kathryn Closs, PA-C 04/26/2022 9:09 AM   REVIEW OF SYSTEMS:   Pertinent positive ROS as above. All other systems were reviewed with the patient and are negative.  I have reviewed the past medical history, past  surgical history, social history and family history with the patient and they are unchanged from previous note.  ALLERGIES:  is allergic to megace [megestrol], penicillins, ibuprofen, and pravastatin.  MEDICATIONS:  Current Outpatient Medications  Medication Sig Dispense Refill   dexamethasone (DECADRON) 4 MG tablet Take 2 tablets (8 mg total) by mouth daily for 3 days. Start the day after chemotherapy. Take with food. 30 tablet 1   Easy Touch Lancets 30G/Twist MISC      lidocaine-prilocaine (EMLA) cream Apply 1 Application topically as needed. Apply to port 1 hour prior to chemotherapy. Cover with plastic wrap. 30 g 2   losartan (COZAAR) 50 MG tablet Take 50 mg by mouth daily.     tirzepatide (MOUNJARO) 5 MG/0.5ML Pen INJECT 5 MG UNDER THE SKIN EVERY 7 DAYS     DULoxetine (CYMBALTA) 30 MG capsule Take 1 capsule (30 mg total) by mouth daily. 30 capsule 0   gabapentin (NEURONTIN) 100 MG capsule Take 2 capsules (200 mg total) by mouth 2 (two) times daily. 120 capsule 1   ondansetron (ZOFRAN) 8 MG tablet Take 1 tablet (8 mg total) by mouth every 8 (eight) hours as needed for nausea or vomiting. Start on the third day after chemotherapy. (Patient not taking: Reported on 03/15/2022) 30 tablet 1   prochlorperazine (COMPAZINE) 10 MG tablet Take 1 tablet (10 mg total) by mouth every 6 (six) hours as needed for nausea or vomiting. (Patient not taking: Reported on 03/15/2022) 30 tablet 1   No current facility-administered medications for this visit.    SUMMARY OF ONCOLOGIC HISTORY: Oncology History  Endometrial cancer ([facility])  07/30/2020 Initial Diagnosis   She was seen by Dr. Leonides Schanz of OBGYN for post menopausal bleeding x 7-8 years.    08/14/2020 Imaging   Pelvic US showed  FINDINGS: Uterus: Measurements: 7.7 x 5.3 x 5.8 cm = volume: 123 mL. No fibroids or other mass visualized. Endometrium: Thickness: 10 mm.  No focal abnormality visualized. Right ovary: Nonvisualized. Left ovary:  Nonvisualized. Other findings: No abnormal free fluid.   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 No or minimal myometrial invasion, less than 50%, suggesting stage 1a disease by imaging.  -She was not a surgical candidate due to morbid obesity.  -S/p IUD Mirena by Dr. Leafy Ro 4/22 - Repeat EMBx on 03/24/21 showed persistent grade 1 endometrioid cancer.  - Unable to tolerate Norethidrone and IUD expelled.       Radiation Therapy   - S/p EBRT at [facility] [facility] followed by 4 fractions of vaginal brachytherapy at [place] completed on 1/44/8185.   - complicated by radiation cystitis and diarrhea. Was seen by Dr. Delaine Lame of ID. Diagnosed with VRE colonization. Symptoms have largely improved since.     01/19/2022 Progression   PET scan performed at [facility]-  IMPRESSION:  1.  Focal area of markedly increased FDG activity in the fundus of the uterus which may represent uterine cancer  2.  Subcentimeter right pelvic and presacral lymph nodes with intense FDG activity concerning for nodal metastases.    02/24/2022 -  Chemotherapy   Patient is on Treatment Plan :  Dose reduced.   Carboplatin AUC 4 + Paclitaxel 135 mg/m2 q21d due to functional status and pre-existing peripheral neuropathy.        PHYSICAL EXAMINATION: ECOG PERFORMANCE STATUS: 2 - Symptomatic, <50% confined to bed  Vitals:   04/26/22 0856  BP: (!) 143/75  Pulse: 91  Temp: 97.8 F (36.6 C)    Filed Weights   04/26/22 0856  Weight: (!) 331 lb (150.1 kg)     GENERAL:alert, no distress and comfortable SKIN: skin color, texture, turgor are normal, no rashes or significant lesions EYES: normal, Conjunctiva are pink and non-injected, sclera clear OROPHARYNX:no exudate, no erythema and lips, buccal mucosa, and tongue normal  NECK: supple, thyroid normal size, non-tender, without nodularity LYMPH:  no palpable lymphadenopathy in the cervical, axillary or  inguinal LUNGS: clear to auscultation and percussion with normal breathing effort HEART: regular rate & rhythm and no murmurs and no lower extremity edema ABDOMEN:abdomen soft, non-tender and normal bowel sounds Musculoskeletal:no cyanosis of digits and no clubbing  NEURO: alert & oriented x 3 with fluent speech, no focal motor/sensory deficits  LABORATORY DATA:  I have reviewed the data as listed    Component Value Date/Time   NA 137 04/26/2022 0826   K 3.4 (L) 04/26/2022 0826   CL 103 04/26/2022 0826   CO2 25 04/26/2022 0826   GLUCOSE 144 (H) 04/26/2022 0826   BUN 11 04/26/2022 0826   CREATININE 0.65 04/26/2022 0826   CALCIUM 8.7 (L) 04/26/2022 0826   PROT 6.9 04/26/2022 0826   ALBUMIN 3.3 (L) 04/26/2022 0826   AST 19 04/26/2022 0826   ALT 16 04/26/2022 0826   ALKPHOS 49 04/26/2022 0826   BILITOT 0.9 04/26/2022 0826   GFRNONAA >60 04/26/2022 0826    No results found for: "SPEP", "UPEP"  Lab Results  Component Value Date   WBC 6.1 04/26/2022   NEUTROABS 4.6 04/26/2022   HGB 9.7 (L) 04/26/2022   HCT 29.8 (L) 04/26/2022   MCV 85.6 04/26/2022   PLT 178 04/26/2022      Chemistry      Component Value Date/Time   NA 137 04/26/2022 0826   K 3.4 (L) 04/26/2022 0826   CL 103 04/26/2022 0826   CO2 25 04/26/2022 0826   BUN 11 04/26/2022 0826   CREATININE 0.65 04/26/2022 0826      Component Value Date/Time   CALCIUM 8.7 (L) 04/26/2022 0826   ALKPHOS 49 04/26/2022 0826   AST 19 04/26/2022 0826   ALT 16 04/26/2022 0826   BILITOT 0.9 04/26/2022 0826       RADIOGRAPHIC STUDIES: I have personally reviewed the radiological images as listed and agreed with the findings in the report. No results found.

## 2022-04-26 NOTE — Patient Instructions (Addendum)
[facility]CER CTR AT [place]  Discharge Instructions: Thank you for choosing [facility] to provide your oncology and hematology care.  If you have a lab appointment with the [facility], please go directly to the [facility] and check in at the registration area.  Wear comfortable clothing and clothing appropriate for easy access to any Portacath or PICC line.   We strive to give you quality time with your provider. You may need to reschedule your appointment if you arrive late (15 or more minutes).  Arriving late affects you and other patients whose appointments are after yours.  Also, if you miss three or more appointments without notifying the office, you may be dismissed from the clinic at the provider's discretion.      For prescription refill requests, have your pharmacy contact our office and allow 72 hours for refills to be completed.    Today you received the following chemotherapy and/or immunotherapy agents Keytruda, Taxol, and Carboplatin        To help prevent nausea and vomiting after your treatment, we encourage you to take your nausea medication as directed.  BELOW ARE SYMPTOMS THAT SHOULD BE REPORTED IMMEDIATELY: *FEVER GREATER THAN 100.4 F (38 C) OR HIGHER *CHILLS OR SWEATING *NAUSEA AND VOMITING THAT IS NOT CONTROLLED WITH YOUR NAUSEA MEDICATION *UNUSUAL SHORTNESS OF BREATH *UNUSUAL BRUISING OR BLEEDING *URINARY PROBLEMS (pain or burning when urinating, or frequent urination) *BOWEL PROBLEMS (unusual diarrhea, constipation, pain near the anus) TENDERNESS IN MOUTH AND THROAT WITH OR WITHOUT PRESENCE OF ULCERS (sore throat, sores in mouth, or a toothache) UNUSUAL RASH, SWELLING OR PAIN  UNUSUAL VAGINAL DISCHARGE OR ITCHING   Items with * indicate a potential emergency and should be followed up as soon as possible or go to the Emergency Department if any problems should occur.  Please show the CHEMOTHERAPY ALERT CARD or IMMUNOTHERAPY  ALERT CARD at check-in to the Emergency Department and triage nurse.  Should you have questions after your visit or need to cancel or reschedule your appointment, please contact [facility] CANCER [place] AT [place]  [phone] and follow the prompts.  Office hours are 8:00 a.m. to 4:30 p.m. Monday - Friday. Please note that voicemails left after 4:00 p.m. may not be returned until the following business day.  We are closed weekends and major holidays. You have access to a nurse at all times for urgent questions. Please call the main number to the clinic [phone] and follow the prompts.  For any non-urgent questions, you may also contact your provider using MyChart. We now offer e-Visits for anyone 71 and older to request care online for non-urgent symptoms. For details visit [email or web address].   Also download the MyChart app! Go to the app store, search "MyChart", open the app, select [facility], and log in with your MyChart username and password.  Masks are optional in the cancer centers. If you would like for your care team to wear a mask while they are taking care of you, please let them know. For doctor visits, patients may have with them one support person who is at least 71 years old. At this time, visitors are not allowed in the infusion area.

## 2022-04-27 LAB — THYROID PANEL WITH TSH
Free Thyroxine Index: 3.1 (ref 1.2–4.9)
T3 Uptake Ratio: 30 % (ref 24–39)
T4, Total: 10.2 ug/dL (ref 4.5–12.0)
TSH: 1.42 u[IU]/mL (ref 0.450–4.500)

## 2022-04-28 ENCOUNTER — Other Ambulatory Visit: Payer: Self-pay | Admitting: Internal Medicine

## 2022-04-28 DIAGNOSIS — G629 Polyneuropathy, unspecified: Secondary | ICD-10-CM

## 2022-05-02 ENCOUNTER — Other Ambulatory Visit: Payer: Self-pay

## 2022-05-02 ENCOUNTER — Inpatient Hospital Stay: Payer: HMO

## 2022-05-02 ENCOUNTER — Inpatient Hospital Stay (HOSPITAL_BASED_OUTPATIENT_CLINIC_OR_DEPARTMENT_OTHER): Payer: HMO | Admitting: Hospice and Palliative Medicine

## 2022-05-02 ENCOUNTER — Telehealth: Payer: Self-pay | Admitting: *Deleted

## 2022-05-02 VITALS — BP 121/71 | HR 93 | Temp 97.6°F | Resp 24

## 2022-05-02 DIAGNOSIS — E860 Dehydration: Secondary | ICD-10-CM

## 2022-05-02 DIAGNOSIS — C541 Malignant neoplasm of endometrium: Secondary | ICD-10-CM

## 2022-05-02 DIAGNOSIS — D649 Anemia, unspecified: Secondary | ICD-10-CM | POA: Diagnosis not present

## 2022-05-02 DIAGNOSIS — D509 Iron deficiency anemia, unspecified: Secondary | ICD-10-CM

## 2022-05-02 DIAGNOSIS — R531 Weakness: Secondary | ICD-10-CM

## 2022-05-02 DIAGNOSIS — Z5111 Encounter for antineoplastic chemotherapy: Secondary | ICD-10-CM | POA: Diagnosis not present

## 2022-05-02 LAB — COMPREHENSIVE METABOLIC PANEL
ALT: 29 U/L (ref 0–44)
AST: 29 U/L (ref 15–41)
Albumin: 3.5 g/dL (ref 3.5–5.0)
Alkaline Phosphatase: 45 U/L (ref 38–126)
Anion gap: 9 (ref 5–15)
BUN: 17 mg/dL (ref 8–23)
CO2: 27 mmol/L (ref 22–32)
Calcium: 8.8 mg/dL — ABNORMAL LOW (ref 8.9–10.3)
Chloride: 98 mmol/L (ref 98–111)
Creatinine, Ser: 0.71 mg/dL (ref 0.44–1.00)
GFR, Estimated: >60 mL/min (ref >60)
Glucose, Bld: 167 mg/dL — ABNORMAL HIGH (ref 70–99)
Potassium: 4 mmol/L (ref 3.5–5.1)
Sodium: 134 mmol/L — ABNORMAL LOW (ref 135–145)
Total Bilirubin: 0.8 mg/dL (ref 0.3–1.2)
Total Protein: 6.7 g/dL (ref 6.5–8.1)

## 2022-05-02 LAB — CBC WITH DIFFERENTIAL/PLATELET
Abs Immature Granulocytes: 0.01 10*3/uL (ref 0.00–0.07)
Basophils Absolute: 0 10*3/uL (ref 0.0–0.1)
Basophils Relative: 1 %
Eosinophils Absolute: 0 10*3/uL (ref 0.0–0.5)
Eosinophils Relative: 1 %
HCT: 28.9 % — ABNORMAL LOW (ref 36.0–46.0)
Hemoglobin: 9.4 g/dL — ABNORMAL LOW (ref 12.0–15.0)
Immature Granulocytes: 1 %
Lymphocytes Relative: 21 %
Lymphs Abs: 0.4 10*3/uL — ABNORMAL LOW (ref 0.7–4.0)
MCH: 27.6 pg (ref 26.0–34.0)
MCHC: 32.5 g/dL (ref 30.0–36.0)
MCV: 85 fL (ref 80.0–100.0)
Monocytes Absolute: 0.1 10*3/uL (ref 0.1–1.0)
Monocytes Relative: 3 %
Neutro Abs: 1.5 10*3/uL — ABNORMAL LOW (ref 1.7–7.7)
Neutrophils Relative %: 73 %
Platelets: 163 10*3/uL (ref 150–400)
RBC: 3.4 MIL/uL — ABNORMAL LOW (ref 3.87–5.11)
RDW: 15.5 % (ref 11.5–15.5)
WBC: 2 10*3/uL — ABNORMAL LOW (ref 4.0–10.5)
nRBC: 0 % (ref 0.0–0.2)

## 2022-05-02 LAB — IRON AND TIBC
Iron: 45 ug/dL (ref 28–170)
Saturation Ratios: 15 % (ref 10.4–31.8)
TIBC: 295 ug/dL (ref 250–450)
UIBC: 250 ug/dL

## 2022-05-02 LAB — FERRITIN: Ferritin: 622 ng/mL — ABNORMAL HIGH (ref 11–307)

## 2022-05-02 LAB — SAMPLE TO BLOOD BANK

## 2022-05-02 LAB — VITAMIN B12: Vitamin B-12: 319 pg/mL (ref 180–914)

## 2022-05-02 LAB — VITAMIN D 25 HYDROXY (VIT D DEFICIENCY, FRACTURES): Vit D, 25-Hydroxy: 10.54 ng/mL — ABNORMAL LOW (ref 30–100)

## 2022-05-02 LAB — MAGNESIUM: Magnesium: 1.5 mg/dL — ABNORMAL LOW (ref 1.7–2.4)

## 2022-05-02 MED ORDER — SODIUM CHLORIDE 0.9% FLUSH
10.0000 mL | Freq: Once | INTRAVENOUS | Status: AC
  Administered 2022-05-02: 10 mL via INTRAVENOUS
  Filled 2022-05-02: qty 10

## 2022-05-02 MED ORDER — MAGNESIUM SULFATE 2 GM/50ML IV SOLN
2.0000 g | Freq: Once | INTRAVENOUS | Status: AC
  Administered 2022-05-02: 2 g via INTRAVENOUS
  Filled 2022-05-02: qty 50

## 2022-05-02 MED ORDER — SODIUM CHLORIDE 0.9 % IV SOLN
INTRAVENOUS | Status: DC
  Filled 2022-05-02 (×2): qty 250

## 2022-05-02 MED ORDER — HEPARIN SOD (PORK) LOCK FLUSH 100 UNIT/ML IV SOLN
500.0000 [IU] | Freq: Once | INTRAVENOUS | Status: AC
  Administered 2022-05-02: 500 [IU] via INTRAVENOUS
  Filled 2022-05-02: qty 5

## 2022-05-02 NOTE — Patient Instructions (Signed)
Hypomagnesemia Hypomagnesemia is a condition in which the level of magnesium in the blood is too low. Magnesium is a mineral that is found in many foods. It is used in many different processes in the body. Hypomagnesemia can affect every organ in the body. In severe cases, it can cause life-threatening problems. What are the causes? This condition may be caused by: Not getting enough magnesium in your diet or not having enough healthy foods to eat (malnutrition). Problems with magnesium absorption in the intestines. Dehydration. Excessive use of alcohol. Vomiting. Severe or long-term (chronic) diarrhea. Some medicines, including medicines that make you urinate more often (diuretics). Certain diseases, such as kidney disease, diabetes, celiac disease, and overactive thyroid. What are the signs or symptoms? Symptoms of this condition include: Loss of appetite, nausea, and vomiting. Involuntary shaking or trembling of a body part (tremor). Muscle weakness or tingling in the arms and legs. Sudden tightening of muscles (muscle spasms). Confusion. Psychiatric issues, such as: Depression and irritability. Psychosis. A feeling of fluttering of the heart (palpitations). Seizures. These symptoms are more severe if magnesium levels drop suddenly. How is this diagnosed? This condition may be diagnosed based on: Your symptoms and medical history. A physical exam. Blood and urine tests. How is this treated? Treatment depends on the cause and the severity of the condition. It may be treated by: Taking a magnesium supplement. This can be taken in pill form. If the condition is severe, magnesium is usually given through an IV. Making changes to your diet. You may be directed to eat foods that have a lot of magnesium, such as green leafy vegetables, peas, beans, and nuts. Not drinking alcohol. If you are struggling not to drink, ask your health care provider for help. Follow these instructions at  home: Eating and drinking     Make sure that your diet includes foods with magnesium. Foods that have a lot of magnesium in them include: Green leafy vegetables, such as spinach and broccoli. Beans and peas. Nuts and seeds, such as almonds and sunflower seeds. Whole grains, such as whole grain bread and fortified cereals. Drink fluids that contain salts and minerals (electrolytes), such as sports drinks, when you are active. Do not drink alcohol. General instructions Take over-the-counter and prescription medicines only as told by your health care provider. Take magnesium supplements as directed if your health care provider tells you to take them. Have your magnesium levels monitored as told by your health care provider. Keep all follow-up visits. This is important. Contact a health care provider if: You get worse instead of better. Your symptoms return. Get help right away if: You develop severe muscle weakness. You have trouble breathing. You feel that your heart is racing. These symptoms may represent a serious problem that is an emergency. Do not wait to see if the symptoms will go away. Get medical help right away. Call your local emergency services (911 in the [place] Do not drive yourself to the hospital. Summary Hypomagnesemia is a condition in which the level of magnesium in the blood is too low. Hypomagnesemia can affect every organ in the body. Treatment may include eating more foods that contain magnesium, taking magnesium supplements, and not drinking alcohol. Have your magnesium levels monitored as told by your health care provider. This information is not intended to replace advice given to you by your health care provider. Make sure you discuss any questions you have with your health care provider. Document Revised: 11/03/2020 Document Reviewed: 11/03/2020 Elsevier Patient Education    2023 Elsevier Inc.  

## 2022-05-02 NOTE — Telephone Encounter (Signed)
When I called her back, she started verbalizing how weak she was and feeling almost too weak to walk. I asked her if she can drive her self safely to the apt. She stated that she has not driving in awhile. Patient at home by herself. Pt stated that she would try to drive herself to the apt. I asked pt to call 911 and go to the ER if she was truly that weak where she could not get out the door by herself.

## 2022-05-02 NOTE — Telephone Encounter (Signed)
Patient called reporting that she had her last chemotherapy treatment last Tuesday and she is very weak and unable to eat. She is not constipated, nor is she vomiting, she just cannot eat and is only drinking equivalence of 2 bottles of water per day. She has her next appointment with Dr Loni Muse on Wed, but was wondering if something can be done for her today. Please advise

## 2022-05-02 NOTE — Progress Notes (Signed)
Symptom Management [place] at [facility] Telephone:([phone]) [phone] Fax:([phone]  Patient Care Team: Sofie Hartigan, MD as PCP - General (Family Medicine) Clent Jacks, RN as Oncology Nurse Navigator [place], [place], MD as Referring Physician (Gynecologic Oncology) Noreene Filbert, MD as Consulting Physician (Radiation Oncology) Shirline Frees, MD as Referring Physician (Radiation Oncology) Lonia Farber, MD as Consulting Physician (Endocrinology)   NAME OF PATIENT: Kathryn Maddox  [id number]  10-16-1950   DATE OF VISIT: 05/02/22  REASON FOR CONSULT: [name] is a 71 y.o. female with multiple medical problems including morbid obesity, hypertension, hyperlipidemia, stage III endometrial cancer status post EBRT and vaginal brachytherapy now on treatment with chemotherapy/immunotherapy.   INTERVAL HISTORY: Patient was last seen in clinic on 04/26/2022 and received cycle 4 carbo/Taxol/Keytruda.  Patient presents to [facility] today for evaluation of severe fatigue and poor oral intake.  She says that she has not had appreciable food intake in 3 to 4 days.  She has been trying to drink fluids but does not feel like she is drinking an adequate quantity.  She says that she has had significant fatigue since she started chemotherapy and it feels like it is worsening with each subsequent cycle.  She says that she has had some loose stools but denies watery or bloody diarrhea.  No nausea or vomiting.  No fever or chills.  No abdominal pain.  Denies urinary symptoms.  No cough or congestion or shortness of breath or chest pain.  Patient offers no further specific complaints today.   PAST MEDICAL HISTORY: Past Medical History:  Diagnosis Date   Carpal tunnel syndrome on both sides 03/2018   Cellulitis and abscess of lower extremity 03/2018   bilateral lower extrems, treated with antibx x 10 days   Diabetes mellitus without  complication ([place])    Dyspnea 03/2018   with any exercise, due to weight   Eczema    Endometrial cancer ([place]) 08/26/2020   Hyperlipidemia    Hypertension    Morbid obesity with BMI of 70 and over, adult ([place]) 03/2018   Sleep apnea 03/2018   uses a cpap    PAST SURGICAL HISTORY:  Past Surgical History:  Procedure Laterality Date   CARPAL TUNNEL RELEASE Left 04/12/2018   Procedure: CARPAL TUNNEL RELEASE;  Surgeon: Hessie Knows, MD;  Location: [facility] ORS;  Service: Orthopedics;  Laterality: Left;   COLONOSCOPY WITH PROPOFOL N/A 12/02/2019   Procedure: COLONOSCOPY WITH PROPOFOL;  Surgeon: Toledo, Benay Pike, MD;  Location: [facility] ENDOSCOPY;  Service: Gastroenterology;  Laterality: N/A;   DILATION AND CURETTAGE OF UTERUS  03/2018   has had 10 d & c's with hysteroscopy   HERNIA REPAIR  8338   umbilical w/ obstructed bowel   IR IMAGING GUIDED PORT INSERTION  02/17/2022   LIPOMA EXCISION Right 1997   shoulder   obstructive bowel surgery  01/2010    HEMATOLOGY/ONCOLOGY HISTORY:  Oncology History  Endometrial cancer ([place])  07/30/2020 Initial Diagnosis   She was seen by Dr. Leonides Schanz of OBGYN for post menopausal bleeding x 7-8 years.    08/14/2020 Imaging   Pelvic US showed  FINDINGS: Uterus: Measurements: 7.7 x 5.3 x 5.8 cm = volume: 123 mL. No fibroids or other mass visualized. Endometrium: Thickness: 10 mm.  No focal abnormality visualized. Right ovary: Nonvisualized. Left ovary: Nonvisualized. Other findings: No abnormal free fluid.   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 No or minimal myometrial invasion, less than 50%, suggesting stage 1a disease by imaging.  -She was not a surgical candidate due to morbid obesity.  -S/p IUD Mirena by Dr. Leafy Ro 4/22 - Repeat EMBx on 03/24/21 showed persistent grade 1 endometrioid cancer.  - Unable to tolerate Norethidrone and IUD expelled.       Radiation Therapy   - S/p  EBRT at [place] followed by 4 fractions of vaginal brachytherapy at [place] completed on 1/61/0960.   - complicated by radiation cystitis and diarrhea. Was seen by Dr. Delaine Lame of ID. Diagnosed with VRE colonization. Symptoms have largely improved since.     01/19/2022 Progression   PET scan performed at [facility]-  IMPRESSION:  1.  Focal area of markedly increased FDG activity in the fundus of the uterus which may represent uterine cancer  2.  Subcentimeter right pelvic and presacral lymph nodes with intense FDG activity concerning for nodal metastases.    02/24/2022 -  Chemotherapy   Patient is on Treatment Plan :  Dose reduced.   Carboplatin AUC 4 + Paclitaxel 135 mg/m2 q21d due to functional status and pre-existing peripheral neuropathy.        ALLERGIES:  is allergic to megace [megestrol], penicillins, ibuprofen, and pravastatin.  MEDICATIONS:  Current Outpatient Medications  Medication Sig Dispense Refill   dexamethasone (DECADRON) 4 MG tablet Take 2 tablets (8 mg total) by mouth daily for 3 days. Start the day after chemotherapy. Take with food. 30 tablet 1   DULoxetine (CYMBALTA) 30 MG capsule Take 1 capsule (30 mg total) by mouth daily. 30 capsule 0   Easy Touch Lancets 30G/Twist MISC      gabapentin (NEURONTIN) 100 MG capsule Take 2 capsules (200 mg total) by mouth 2 (two) times daily. 120 capsule 1   lidocaine-prilocaine (EMLA) cream Apply 1 Application topically as needed. Apply to port 1 hour prior to chemotherapy. Cover with plastic wrap. 30 g 2   losartan (COZAAR) 50 MG tablet Take 50 mg by mouth daily.     ondansetron (ZOFRAN) 8 MG tablet Take 1 tablet (8 mg total) by mouth every 8 (eight) hours as needed for nausea or vomiting. Start on the third day after chemotherapy. (Patient not taking: Reported on 03/15/2022) 30 tablet 1   prochlorperazine (COMPAZINE) 10 MG tablet Take 1 tablet (10 mg total) by mouth every 6 (six) hours as needed for nausea or vomiting. (Patient not  taking: Reported on 03/15/2022) 30 tablet 1   tirzepatide (MOUNJARO) 5 MG/0.5ML Pen INJECT 5 MG UNDER THE SKIN EVERY 7 DAYS     No current facility-administered medications for this visit.    VITAL SIGNS: BP 121/71   Pulse 93   Temp 97.6 F (36.4 C) (Tympanic)   Resp (!) 24   SpO2 96%  There were no vitals filed for this visit.  Estimated body mass index is 62.54 kg/m as calculated from the following:   Height as of 02/28/22: '5\' 1"'$  (1.549 m).   Weight as of 04/26/22: 331 lb (150.1 kg).  LABS: CBC:    Component Value Date/Time   WBC 2.0 (L) 05/02/2022 1345   HGB 9.4 (L) 05/02/2022 1345   HCT 28.9 (L) 05/02/2022 1345   PLT 163 05/02/2022 1345   MCV 85.0 05/02/2022 1345   NEUTROABS 1.5 (L) 05/02/2022 1345   LYMPHSABS 0.4 (L) 05/02/2022 1345   MONOABS 0.1 05/02/2022 1345   EOSABS 0.0 05/02/2022 1345   BASOSABS 0.0 05/02/2022 1345   Comprehensive Metabolic  Panel:    Component Value Date/Time   NA 137 04/26/2022 0826   K 3.4 (L) 04/26/2022 0826   CL 103 04/26/2022 0826   CO2 25 04/26/2022 0826   BUN 11 04/26/2022 0826   CREATININE 0.65 04/26/2022 0826   GLUCOSE 144 (H) 04/26/2022 0826   CALCIUM 8.7 (L) 04/26/2022 0826   AST 19 04/26/2022 0826   ALT 16 04/26/2022 0826   ALKPHOS 49 04/26/2022 0826   BILITOT 0.9 04/26/2022 0826   PROT 6.9 04/26/2022 0826   ALBUMIN 3.3 (L) 04/26/2022 0826    RADIOGRAPHIC STUDIES: No results found.  PERFORMANCE STATUS (ECOG) : 3 - Symptomatic, >50% confined to bed  Review of Systems Unless otherwise noted, a complete review of systems is negative.  Physical Exam General: NAD Cardiovascular: regular rate and rhythm Pulmonary: clear ant fields Abdomen: soft, nontender, + bowel sounds GU: no suprapubic tenderness Extremities: no edema, no joint deformities Skin: no rashes Neurological: Weakness but otherwise nonfocal  IMPRESSION/PLAN: Fatigue/weakness -likely secondary to chemotherapy.  Patient may benefit from chemo holiday but  will defer to oncology. Recommend ongoing supportive care.  Will add on rehab screening and nutrition consult. TSH recently checked and normal.  We will add on anemia work-up including iron studies/B12/folate and fatigue work-up including vitamin D.  We will proceed with IV fluids today.      Hypomagnesemia -replete with IV mag  Dehydration - IV fluids today  RTC on 11/15   Patient expressed understanding and was in agreement with this plan. She also understands that She can call clinic at any time with any questions, concerns, or complaints.   Thank you for allowing me to participate in the care of this very pleasant patient.   Time Total: 20 minutes  Visit consisted of counseling and education dealing with the complex and emotionally intense issues of symptom management in the setting of serious illness.Greater than 50%  of this time was spent counseling and coordinating care related to the above assessment and plan.  Signed by: Altha Harm, PhD, NP-C

## 2022-05-03 ENCOUNTER — Inpatient Hospital Stay: Payer: HMO

## 2022-05-03 MED ORDER — ERGOCALCIFEROL 1.25 MG (50000 UT) PO CAPS: 50000.0000 [IU] | ORAL_CAPSULE | ORAL | 3 refills | Status: DC

## 2022-05-03 NOTE — Addendum Note (Signed)
Addended by: Altha Harm R on: 05/03/2022 10:05 AM   Modules accepted: Orders

## 2022-05-03 NOTE — Progress Notes (Signed)
Nutrition Assessment   Reason for Assessment:  Poor po intake, taste alterations   ASSESSMENT:  71 year old female with stage III endometrial cancer.  Patient receiving carbo/taxol and [place].  Past medical history of obesity, HTN, HLD, DM.  Patient seen in [facility] for severe fatigue and poor oral intake.  Spoke with patient via phone for nutrition assessment.  Patient reports that after leaving clinic yesterday from [facility]/fluids she was able to eat a sandwich but had diarrhea following it.  No further episodes of diarrhea since eating sandwich.  Reports poor appetite but she was able to taste and smell sandwich yesterday.  She has been trying to drink fluids.  Denies nausea.    Medications: mounjaro, zofran, compazine   Labs: reviewed   Anthropometrics:   Height: 61 inches Weight: 331 lb 341 lb on 10/17 BMI: 62  3% weight loss in the last month    NUTRITION DIAGNOSIS: Inadequate oral intake related to cancer related treatment side effects as evidenced by 3% weight loss in the last month, decreased oral intake    INTERVENTION:  Discussed food strategies to help with diarrhea.  Patient will see MD tomorrow to discuss medication options to help control diarrhea.  Did report previous concerns with constipation. Will mail handout on diarrhea. Encouraged mini meals q 2 hours and sipping on liquids for hydration Contact information provided   MONITORING, EVALUATION, GOAL: weight trends, intake   Next Visit: Tuesday, Dec 5th phone call  Renesmee Raine B. Zenia Resides, [place], [place] Registered Dietitian [phone]

## 2022-05-04 ENCOUNTER — Inpatient Hospital Stay (HOSPITAL_BASED_OUTPATIENT_CLINIC_OR_DEPARTMENT_OTHER): Payer: HMO | Admitting: Obstetrics and Gynecology

## 2022-05-04 ENCOUNTER — Encounter: Payer: Self-pay | Admitting: Internal Medicine

## 2022-05-04 ENCOUNTER — Inpatient Hospital Stay: Payer: HMO | Admitting: Occupational Therapy

## 2022-05-04 ENCOUNTER — Inpatient Hospital Stay: Payer: HMO

## 2022-05-04 ENCOUNTER — Inpatient Hospital Stay (HOSPITAL_BASED_OUTPATIENT_CLINIC_OR_DEPARTMENT_OTHER): Payer: HMO | Admitting: Internal Medicine

## 2022-05-04 VITALS — BP 162/64 | HR 89 | Temp 98.8°F | Resp 16 | Wt 320.0 lb

## 2022-05-04 DIAGNOSIS — Z5111 Encounter for antineoplastic chemotherapy: Secondary | ICD-10-CM | POA: Diagnosis not present

## 2022-05-04 DIAGNOSIS — Z95828 Presence of other vascular implants and grafts: Secondary | ICD-10-CM

## 2022-05-04 DIAGNOSIS — C541 Malignant neoplasm of endometrium: Secondary | ICD-10-CM

## 2022-05-04 DIAGNOSIS — G629 Polyneuropathy, unspecified: Secondary | ICD-10-CM

## 2022-05-04 DIAGNOSIS — Z7189 Other specified counseling: Secondary | ICD-10-CM | POA: Diagnosis not present

## 2022-05-04 DIAGNOSIS — R638 Other symptoms and signs concerning food and fluid intake: Secondary | ICD-10-CM

## 2022-05-04 DIAGNOSIS — E119 Type 2 diabetes mellitus without complications: Secondary | ICD-10-CM

## 2022-05-04 DIAGNOSIS — R531 Weakness: Secondary | ICD-10-CM

## 2022-05-04 DIAGNOSIS — E860 Dehydration: Secondary | ICD-10-CM

## 2022-05-04 DIAGNOSIS — I10 Essential (primary) hypertension: Secondary | ICD-10-CM | POA: Diagnosis not present

## 2022-05-04 LAB — CBC WITH DIFFERENTIAL/PLATELET
Abs Immature Granulocytes: 0 10*3/uL (ref 0.00–0.07)
Basophils Absolute: 0 10*3/uL (ref 0.0–0.1)
Basophils Relative: 1 %
Eosinophils Absolute: 0 10*3/uL (ref 0.0–0.5)
Eosinophils Relative: 2 %
HCT: 28.1 % — ABNORMAL LOW (ref 36.0–46.0)
Hemoglobin: 9.2 g/dL — ABNORMAL LOW (ref 12.0–15.0)
Immature Granulocytes: 0 %
Lymphocytes Relative: 28 %
Lymphs Abs: 0.5 10*3/uL — ABNORMAL LOW (ref 0.7–4.0)
MCH: 27.9 pg (ref 26.0–34.0)
MCHC: 32.7 g/dL (ref 30.0–36.0)
MCV: 85.2 fL (ref 80.0–100.0)
Monocytes Absolute: 0.2 10*3/uL (ref 0.1–1.0)
Monocytes Relative: 9 %
Neutro Abs: 1.1 10*3/uL — ABNORMAL LOW (ref 1.7–7.7)
Neutrophils Relative %: 60 %
Platelets: 178 10*3/uL (ref 150–400)
RBC: 3.3 MIL/uL — ABNORMAL LOW (ref 3.87–5.11)
RDW: 15.5 % (ref 11.5–15.5)
WBC: 1.9 10*3/uL — ABNORMAL LOW (ref 4.0–10.5)
nRBC: 0 % (ref 0.0–0.2)

## 2022-05-04 LAB — COMPREHENSIVE METABOLIC PANEL
ALT: 27 U/L (ref 0–44)
AST: 25 U/L (ref 15–41)
Albumin: 3.6 g/dL (ref 3.5–5.0)
Alkaline Phosphatase: 45 U/L (ref 38–126)
Anion gap: 9 (ref 5–15)
BUN: 13 mg/dL (ref 8–23)
CO2: 25 mmol/L (ref 22–32)
Calcium: 9.4 mg/dL (ref 8.9–10.3)
Chloride: 99 mmol/L (ref 98–111)
Creatinine, Ser: 0.67 mg/dL (ref 0.44–1.00)
GFR, Estimated: >60 mL/min (ref >60)
Glucose, Bld: 135 mg/dL — ABNORMAL HIGH (ref 70–99)
Potassium: 3.6 mmol/L (ref 3.5–5.1)
Sodium: 133 mmol/L — ABNORMAL LOW (ref 135–145)
Total Bilirubin: 0.6 mg/dL (ref 0.3–1.2)
Total Protein: 7 g/dL (ref 6.5–8.1)

## 2022-05-04 LAB — MAGNESIUM: Magnesium: 1.5 mg/dL — ABNORMAL LOW (ref 1.7–2.4)

## 2022-05-04 MED ORDER — MAGNESIUM SULFATE 2 GM/50ML IV SOLN
2.0000 g | Freq: Once | INTRAVENOUS | Status: AC
  Administered 2022-05-04: 2 g via INTRAVENOUS
  Filled 2022-05-04: qty 50

## 2022-05-04 MED ORDER — SODIUM CHLORIDE 0.9 % IV SOLN
Freq: Once | INTRAVENOUS | Status: AC
  Filled 2022-05-04: qty 250

## 2022-05-04 MED ORDER — HEPARIN SOD (PORK) LOCK FLUSH 100 UNIT/ML IV SOLN
500.0000 [IU] | Freq: Once | INTRAVENOUS | Status: AC
  Administered 2022-05-04: 500 [IU] via INTRAVENOUS
  Filled 2022-05-04: qty 5

## 2022-05-04 MED ORDER — SODIUM CHLORIDE 0.9% FLUSH
10.0000 mL | INTRAVENOUS | Status: DC | PRN
  Administered 2022-05-04: 10 mL via INTRAVENOUS
  Filled 2022-05-04: qty 10

## 2022-05-04 NOTE — Progress Notes (Unsigned)
Pt in for follow up post chemo treatment.  Pt reports does not have an appetite and has had loose stools following each attempt at eating.

## 2022-05-04 NOTE — Therapy (Signed)
[facility] at [facility] [place], [place], [place], [address] Phone: [phone]   Fax:  [phone]  Occupational Therapy Screen:  Patient Details  Name: Kathryn Maddox MRN: [id number] Date of Birth: 03-14-1951 No data recorded  Encounter Date: 05/04/2022   OT End of Session - 05/04/22 1250     Visit Number 0             Past Medical History:  Diagnosis Date   Carpal tunnel syndrome on both sides 03/2018   Cellulitis and abscess of lower extremity 03/2018   bilateral lower extrems, treated with antibx x 10 days   Diabetes mellitus without complication ([place])    Dyspnea 03/2018   with any exercise, due to weight   Eczema    Endometrial cancer ([place]) 08/26/2020   Hyperlipidemia    Hypertension    Morbid obesity with BMI of 70 and over, adult ([place]) 03/2018   Sleep apnea 03/2018   uses a cpap    Past Surgical History:  Procedure Laterality Date   CARPAL TUNNEL RELEASE Left 04/12/2018   Procedure: CARPAL TUNNEL RELEASE;  Surgeon: Hessie Knows, MD;  Location: [facility] ORS;  Service: Orthopedics;  Laterality: Left;   COLONOSCOPY WITH PROPOFOL N/A 12/02/2019   Procedure: COLONOSCOPY WITH PROPOFOL;  Surgeon: Toledo, Benay Pike, MD;  Location: [facility] ENDOSCOPY;  Service: Gastroenterology;  Laterality: N/A;   DILATION AND CURETTAGE OF UTERUS  03/2018   has had 10 d & c's with hysteroscopy   HERNIA REPAIR  3154   umbilical w/ obstructed bowel   IR IMAGING GUIDED PORT INSERTION  02/17/2022   LIPOMA EXCISION Right 1997   shoulder   obstructive bowel surgery  01/2010    There were no vitals filed for this visit.   Subjective Assessment - 05/04/22 1249     Subjective  This last chemo realy hit me hard- I have diarrhea and no energy. Numbness in my hands and feet.    Currently in Pain? No/denies                       05/02/22 Vonna Kotyk B[place]: REASON FOR CONSULT: Eliora Nienhuis is a 71 y.o. female with multiple  medical problems including morbid obesity, hypertension, hyperlipidemia, stage III endometrial cancer status post EBRT and vaginal brachytherapy now on treatment with chemotherapy/immunotherapy.    INTERVAL HISTORY: Patient was last seen in clinic on 04/26/2022 and received cycle 4 carbo/Taxol/Keytruda.   Patient presents to [facility] today for evaluation of severe fatigue and poor oral intake.  She says that she has not had appreciable food intake in 3 to 4 days.  She has been trying to drink fluids but does not feel like she is drinking an adequate quantity.  She says that she has had significant fatigue since she started chemotherapy and it feels like it is worsening with each subsequent cycle.   She says that she has had some loose stools but denies watery or bloody diarrhea.  No nausea or vomiting.  No fever or chills.  No abdominal pain.  Denies urinary symptoms.  No cough or congestion or shortness of breath or chest pain.    IMPRESSION/PLAN: Fatigue/weakness -likely secondary to chemotherapy.  Patient may benefit from chemo holiday but will defer to oncology. Recommend ongoing supportive care.  Will add on rehab screening and nutrition consult. TSH recently checked and normal.  We will add on anemia work-up including iron studies/B12/folate and fatigue work-up including vitamin D.  We will proceed with IV fluids today.       Hypomagnesemia -replete with IV mag   Dehydration - IV fluids today   RTC on 11/15     OT SCREEN 05/04/22:  Patient arrived in wheelchair this date.  Patient report during chemo patient started using a walker more at home as well as outside the house.  Denies any falls.  Patient lives alone.  One 4 inch steps to get in and out of the house.  Patient with a walk-in shower and tub with a seat and a railing to hold onto.   Patient to report numbness in bilateral hands and feet.  Patient reports being very limited in her strength and endurance because of diarrhea the last  few wks - but especially worse since the last treatment. Attempted BERG balance test but patient was unable to do because of neuropathy as well as got cramp/spasm in her back.  Patient able to sit and stand without using hands.  Able to stand without holding on. Did discuss with patient possibility when feeling a little bit better and stronger to do CARE program -patient interested and would like to pursue if diarrhea and chemo fatigue gets better. Provided patient with a home program to do during the day if possible. -Attempted 4 times a day 6 to 7 minutes of  -Sit and stand 5-10 reps -Sidestepping at kitchen counter 30 seconds to 2 minutes-holding on -At kitchen counter heel raises 10 reps.  Holding on -Standing at kitchen counter weightbearing and tapping with leg into 12:00, 3:00 and 6:00 30 seconds to 2 minutes alternating legs.  Patient in agreement we will follow-up with patient in 2 weeks with a phone call.                       Visit Diagnosis: Weakness  Neuropathy    Problem List Patient Active Problem List   Diagnosis Date Noted   Essential hypertension 09/08/2021   OSA (obstructive sleep apnea) 09/08/2021   Endometrial cancer ([place]) 08/26/2020   Diabetic peripheral neuropathy ([place]) 12/06/2017   Obesity, morbid (more than 100 lbs over ideal weight or BMI > 40) ([place]) 12/06/2017   Type 2 diabetes mellitus, without long-term current use of insulin ([place]) 12/06/2017    Rosalyn Gess, OTR/L,CLT 05/04/2022, 12:51 PM  [place] at [facility] [address], [place], [place], [address] Phone: [phone]   Fax:  [phone]  Name: Linnie Delgrande MRN: [id number] Date of Birth: 1950-12-17

## 2022-05-04 NOTE — Progress Notes (Unsigned)
[facility] OFF[facility] PROGRESS NOTE  Patient Care Team: Kathryn Hartigan, [facility] as PCP - General (Family Medicine) Kathryn Jacks, RN as Oncology Nurse Navigator [facility], [place], [facility] as Referring Physician (Gynecologic Oncology) Kath[facility] Filbert, [facility] as Consulting Physician (Radiation Oncology) Kathryn Frees, [facility] as Referring Physician (Radiation Oncology) Kath[facility] Farber, [facility] as Consulting Physician (Endocrinology)  CURRENT TREATMENT-  Carboplatin AUC 4 and Paclitaxel 135 mg/m2 q21 days started on 02/24/2022. Pembrolizumab added with cycle 2  ASSESSMENT & PLAN:  [facility] is a 71 yo F with pmh of morbid obesity, HTN and HLD was diagnosed with endometrioid cancer, grade 1, staged 1A per imaging in Feb 2022, referred to medical oncology to discuss about systemic treatment options upon progression.    #Endometrioid cancer, Stage III per imaging, pMMR and p53 wildtype - diagnosed with stage 1A grade 1 endometrioid cancer in Feb 2022 after post menopausal bleeding.  - not a surgical candidate due to morbid obesity  - s/p Mirena IUD with persistance of disease evident on repeat EMBx done in 10/22  - could not tolerate Norethindrone  - S/p EBRT at [facility] followed by 4 fractions of vaginal brachytherapy at [facility] completed on 10/04/2021.  - PET/CT from 01/19/2022 showed focal area of increased FDG activity in fundus of the uterus and right pelvic and presacral lymph nodes concerning for nodal metastases.    -Patient completed 4 cycles of carboplatin, Taxol and pembrolizumab (added with cycle 2) on 04/26/2022.  Repeat PET CT scan done at [facility] showed interval decrease in the size of previously seen right pelvic and presacral lymph nodes.  However there is enlarged uterus with interval increase in multifocal marked FDG avidity involving the uterine myometrium and endometrial/myometrial junction of the fundus and body concerning for worsening tumor burden. Discussed about concern  for disease progression and further treatment options versus supportive care.  Systemic treatment such as single agent doxorubicin, topotecan or adding lenvatinib to pembrolizumab.  Patient had a lot of difficulty tolerating chemotherapy so fall with extreme fatigue, weakness and poor appetite.  She is not interested in pursuing further systemic treatment option.  Patient is planned to see [facility] today and I will follow-up with his recommendations.  She also saw [facility] radiation oncology at [facility] who did not have further treatment options.  I will make referral to palliative care.  #Generalized weakness #Poor p.o. intake -IV hydration 1 L normal saline.  Mag 1.5.  We will add 2 g of mag.  #Abdominal pelvic pain,  -controlled.  Could be from prior radiation -continue morphine 15 mg q6prn.   #Neuropathy from long standing diabetes -Worsened on Taxol.  Patient could not tolerate gabapentin and Cymbalta due to GI upset. - # access - port   RTC in 2 weeks for [facility] visit.  No labs.  No orders of the defined types were placed in this encounter.   All questions were answered. The patient knows to call the clinic with any problems, questions or concerns. The total time spent in the appointment was 30 minutes encounter with patients including review of chart and various tests results, discussions about plan of care and coordination of care plan   [facility] 05/04/2022 12:54 PM  INTERVAL HISTORY: [name] with diagnosis of stage III endometrioid cancer s/p Mirena IUD, could not tolerate norethindrone, EBRT and vaginal brachytherapy now on systemic treatment.  Patient was seen today after cycle 4 of CarboTaxol and pembrolizumab.  Patient  had a lot of difficulty tolerating the treatment.  She has extreme fatigue weakness and inability to move around.  She was constipated and then started stool softener.  Now she has diarrhea and is on Imodium.  Her appetite is  very poor.  She has not been able to eat solid food.  She has worsening neuropathy.  She feels hot and cold.  REVIEW OF SYSTEMS:   Pertinent positive ROS as above. All other systems were reviewed with the patient and are negative.  I have reviewed the past medical history, past surgical history, social history and family history with the patient and they are unchanged from previous note.  ALLERGIES:  is allergic to megace [megestrol], penicillins, ibuprofen, and pravastatin.  MEDICATIONS:  Current Outpatient Medications  Medication Sig Dispense Refill   dexamethasone (DECADRON) 4 MG tablet Take 2 tablets (8 mg total) by mouth daily for 3 days. Start the day after chemotherapy. Take with food. 30 tablet 1   Easy Touch Lancets 30G/Twist MISC      ergocalciferol (VITAMIN D2) 1.25 MG (50000 UT) capsule Take 1 capsule (50,000 Units total) by mouth once a week. 30 capsule 3   lidocaine-prilocaine (EMLA) cream Apply 1 Application topically as needed. Apply to port 1 hour prior to chemotherapy. Cover with plastic wrap. 30 g 2   losartan (COZAAR) 50 MG tablet Take 50 mg by mouth daily.     tirzepatide (MOUNJARO) 5 MG/0.5ML Pen INJECT 5 MG UNDER THE SKIN EVERY 7 DAYS     ondansetron (ZOFRAN) 8 MG tablet Take 1 tablet (8 mg total) by mouth every 8 (eight) hours as needed for nausea or vomiting. Start on the third day after chemotherapy. (Patient not taking: Reported on 05/04/2022) 30 tablet 1   prochlorperazine (COMPAZINE) 10 MG tablet Take 1 tablet (10 mg total) by mouth every 6 (six) hours as needed for nausea or vomiting. (Patient not taking: Reported on 03/15/2022) 30 tablet 1   No current facility-administered medications for this visit.   Facility-Administered Medications Ordered in Other Visits  Medication Dose Route Frequency Provider Last Rate Last Admin   0.9 %  sodium chloride infusion   Intravenous Once [facility] 999 mL/hr at 05/04/22 1207 New Bag at 05/04/22 1207   magnesium  sulfate IVPB 2 g 50 mL  2 g Intravenous Once [facility] 50 mL/hr at 05/04/22 1248 2 g at 05/04/22 1248    SUMMARY OF ONCOLOGIC HISTORY: Oncology History  Endometrial cancer ([facility])  07/30/2020 Initial Diagnosis   She was seen by Dr. Leonides Schanz of OBGYN for post menopausal bleeding x 7-8 years.    08/14/2020 Imaging   Pelvic US showed  FINDINGS: Uterus: Measurements: 7.7 x 5.3 x 5.8 cm = volume: 123 mL. No fibroids or other mass visualized. Endometrium: Thickness: 10 mm.  No focal abnormality visualized. Right ovary: Nonvisualized. Left ovary: Nonvisualized. Other findings: No abnormal free fluid.   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 No or minimal myometrial invasion, less than 50%, suggesting stage 1a disease by imaging.  -She was not a surgical candidate due to morbid obesity.  -S/p IUD Mirena by Dr. Leafy Ro 4/22 - Repeat EMBx on 03/24/21 showed persistent grade 1 endometrioid cancer.  - Unable to tolerate Norethidrone and IUD expelled.       Radiation Therapy   - S/p EBRT at [facility] followed by 4 fractions of vaginal brachytherapy at [facility] completed on 10/04/2021.   -  complicated by radiation cystitis and diarrhea. Was seen by Dr. Delaine Lame of ID. Diagnosed with VRE colonization. Symptoms have largely improved since.     01/19/2022 Progression   PET scan performed at [facility]-  IMPRESSION:  1.  Focal area of markedly increased FDG activity in the fundus of the uterus which may represent uterine cancer  2.  Subcentimeter right pelvic and presacral lymph nodes with intense FDG activity concerning for nodal metastases.    02/24/2022 -  Chemotherapy   Patient is on Treatment Plan :  Dose reduced.   Carboplatin AUC 4 + Paclitaxel 135 mg/m2 q21d due to functional status and pre-existing peripheral neuropathy.        PHYSICAL EXAMINATION: ECOG PERFORMANCE STATUS: 2 - Symptomatic, <50% confined to  bed  Vitals:   05/04/22 1209  BP: (!) 162/64  Pulse: 89  Resp: 16  Temp: 98.8 F (37.1 C)  SpO2: 97%    Filed Weights   05/04/22 1209  Weight: (!) 320 lb (145.2 kg)     GENERAL:alert, no distress and comfortable SKIN: skin color, texture, turgor are normal, no rashes or significant lesions EYES: normal, Conjunctiva are pink and non-injected, sclera clear OROPHARYNX:no exudate, no erythema and lips, buccal mucosa, and tongue normal  NECK: supple, thyroid normal size, non-tender, without nodularity LYMPH:  no palpable lymphadenopathy in the cervical, axillary or inguinal LUNGS: clear to auscultation and percussion with normal breathing effort HEART: regular rate & rhythm and no murmurs and no lower extremity edema ABDOMEN:abdomen soft, non-tender and normal bowel sounds Musculoskeletal:no cyanosis of digits and no clubbing  NEURO: alert & oriented x 3 with fluent speech, no focal motor/sensory deficits  LABORATORY DATA:  I have reviewed the data as listed    Component Value Date/Time   NA 133 (L) 05/04/2022 1134   K 3.6 05/04/2022 1134   CL 99 05/04/2022 1134   CO2 25 05/04/2022 1134   GLUCOSE 135 (H) 05/04/2022 1134   BUN 13 05/04/2022 1134   CREATININE 0.67 05/04/2022 1134   CALCIUM 9.4 05/04/2022 1134   PROT 7.0 05/04/2022 1134   ALBUMIN 3.6 05/04/2022 1134   AST 25 05/04/2022 1134   ALT 27 05/04/2022 1134   ALKPHOS 45 05/04/2022 1134   BILITOT 0.6 05/04/2022 1134   GFRNONAA >60 05/04/2022 1134    No results found for: "SPEP", "UPEP"  Lab Results  Component Value Date   WBC 1.9 (L) 05/04/2022   NEUTROABS 1.1 (L) 05/04/2022   HGB 9.2 (L) 05/04/2022   HCT 28.1 (L) 05/04/2022   MCV 85.2 05/04/2022   PLT 178 05/04/2022      Chemistry      Component Value Date/Time   NA 133 (L) 05/04/2022 1134   K 3.6 05/04/2022 1134   CL 99 05/04/2022 1134   CO2 25 05/04/2022 1134   BUN 13 05/04/2022 1134   CREATININE 0.67 05/04/2022 1134      Component Value  Date/Time   CALCIUM 9.4 05/04/2022 1134   ALKPHOS 45 05/04/2022 1134   AST 25 05/04/2022 1134   ALT 27 05/04/2022 1134   BILITOT 0.6 05/04/2022 1134       RADIOGRAPHIC STUDIES: I have personally reviewed the radiological images as listed and agreed with the findings in the report. No results found.

## 2022-05-04 NOTE — Progress Notes (Signed)
[facility]  Progress Note  Patient Care Team: Sofie Hartigan, MD as PCP - General (Family Medicine) Clent Jacks, RN as Oncology Nurse Navigator [place], [place], MD as Referring Physician (Gynecologic Oncology) Noreene Filbert, MD as Consulting Physician (Radiation Oncology) Shirline Frees, MD as Referring Physician (Radiation Oncology) Lonia Farber, MD as Consulting Physician (Endocrinology)   Name of the patient: Kathryn Maddox  [id number]  April 23, 1951   Date of visit: 05/04/22  Diagnosis/Chief complaint- Endometrial Cancer  Interval history- Patient is 71 female diagnosed with grade 1 endometrial cancer, suggestive of 1a disease on imaging s/p IUD, persistent disease on repeat biopsy, unable to tolerate norethindrone, IUD expelled, now s/p external beam radiation at [place] followed by 4 fractions of vaginal brachytherapy at [facility], completed 10/04/21 who returns to clinic for follow up and discussion of treatment options.  She presents today to discuss treatment options. Treated with four cycles of carboplatin/taxol chemotherapy and pembrolizumab added with cycle 2.    Repeat PET CT scan done at [facility] 04/27/22 showed interval decrease in the size of previously seen right pelvic and presacral lymph nodes.  However there is enlarged uterus with interval increase in multifocal marked FDG avidity involving the uterine myometrium and endometrial/myometrial junction of the fundus and body concerning for worsening tumor burden. Patient had a lot of difficulty tolerating chemotherapy with extreme fatigue, weakness and poor appetite. She is not interested in pursuing further systemic treatment option.   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 She has not seen urology for management.    Gynecologic Oncology  History Kathryn Maddox is a pleasant G71 female with multiple medical issues including morbid obesity, poorly controlled diabetes, and sleep apnea who is seen in consultation from Dr. Leonides Schanz for grade 1 endometrial cancer, s/p IUD placement 12/02/20 as she is poor surgical candidate.   She has a long history of abnormal vaginal bleeding and menorrhagia and anemia due to chronic blood loss going back to her 30's. She had a syncopal episode from losing so much blood. In [place], had 9 D&Cs with hysteroscopy, last one was in 2010, and bleeding always continued. She was treated with progesterone injections to stop bleeding. In '92 she had precancerous cells in uterus and was seeing oncologist for this. Was supposed to get a hysterectomy with oncologist, but had a bowel obstruction and surgery was denied until she was recovered. Based on what she described she had a ventral hernia that was repaired and she does not know if bowel was resected or not. She had not been to gynecologist since 2011 until she was seen by Dr. Leonides Schanz 07/30/2020. She has had postmenopausal bleeding x 7-8 years.   Her work up has included pelvic US, EMBx, and labs.    Pelvic US 08/14/2020 at [facility]: Uterus: Measurements: 7.7 x 5.3 x 5.8 cm = volume: 123 mL. No fibroids or other mass visualized. Endometrium: Thickness: 10 mm.  No focal abnormality visualized. Right ovary: Nonvisualized. Left ovary: Nonvisualized. Other findings: No abnormal free fluid.   08/14/2020 EMBX showed a WELL DIFFERENTIATED ENDOMETRIAL ADENOCARCINOMA   She was previously on Megace and had a rash.   - not a surgical candidate due to morbid obesity  - Opted for IUD for conservative management.   03/24/2021 EMBX - ENDOMETRIOID CARCINOMA, LOW-GRADE (FIGO 1), WITH TREATMENT EFFECT.    Pelvic MRI 04/07/2021 FINDINGS:  -- Uterus:  Measures 10.2 x 6.3 x 7.5 cm (volume = 250 cm^3). Endometrial thickness measures 13 mm, without significant change compared to previous  study. IUD again noted. Indistinct endometrial-myometrial junction is suspicious for superficial myometrial invasion, however there is no evidence of deep (> 50%) myometrial invasion. Cervix and vagina are unremarkable. -- Bilateral ovaries: Appears normal. No ovarian or adnexal masses IMPRESSION: Stable endometrial thickening measuring 13 mm, with IUD again noted. Probable superficial myometrial invasion, also without significant change. (FIGO stage IA). No evidence of pelvic metastatic disease. Sigmoid colon diverticulosis, without evidence of diverticulitis.   06/09/2021 Exam IUD noted to be expelled.  Also did not tolerate norethindrone.   EBRT at [facility] followed by 4 fractions of vaginal brachytherapy at [place] completed on 10/04/2021.   - PET/CT from 01/19/2022 showed focal area of increased FDG activity in fundus of the uterus and right pelvic and presacral lymph nodes concerning for nodal metastases.   Treated with four cycles of carboplatin/taxol chemotherapy and pembrolizumab added with cycle 2.    Patient had a lot of difficulty tolerating chemotherapy so fall with extreme fatigue, weakness and poor appetite. She is not interested in pursuing further systemic treatment option.        Past Medical History:  Diagnosis Date   Carpal tunnel syndrome on both sides 03/2018   Cellulitis and abscess of lower extremity 03/2018   bilateral lower extrems, treated with antibx x 10 days   Diabetes mellitus without complication ([place])    Dyspnea 03/2018   with any exercise, due to weight   Eczema    Endometrial cancer ([place]) 08/26/2020   Hyperlipidemia    Hypertension    Morbid obesity with BMI of 70 and over, adult ([place]) 03/2018   Sleep apnea 03/2018   uses a cpap   Past Surgical History:  Procedure Laterality Date   CARPAL TUNNEL RELEASE Left 04/12/2018   Procedure: CARPAL TUNNEL RELEASE;  Surgeon: Hessie Knows, MD;  Location: [facility] ORS;  Service: Orthopedics;  Laterality: Left;    COLONOSCOPY WITH PROPOFOL N/A 12/02/2019   Procedure: COLONOSCOPY WITH PROPOFOL;  Surgeon: Toledo, Benay Pike, MD;  Location: [facility] ENDOSCOPY;  Service: Gastroenterology;  Laterality: N/A;   DILATION AND CURETTAGE OF UTERUS  03/2018   has had 10 d & c's with hysteroscopy   HERNIA REPAIR  7622   umbilical w/ obstructed bowel   IR IMAGING GUIDED PORT INSERTION  02/17/2022   LIPOMA EXCISION Right 1997   shoulder   obstructive bowel surgery  01/2010   Social History   Socioeconomic History   Marital status: Divorced    Spouse name: Not on file   Number of children: Not on file   Years of education: Not on file   Highest education level: Not on file  Occupational History   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[place], now retired  Tobacco Use   Smoking status: Never    Passive exposure: Never   Smokeless tobacco: Never  Vaping Use   Vaping Use: Never used  Substance and Sexual Activity   Alcohol use: Yes    Comment: rarely   Drug use: Never   Sexual activity: Not Currently  Other Topics Concern   Not on file  Social History Narrative   Not on file   Social Determinants of Health   Financial Resource Strain: Not on file  Food Insecurity: Not on file  Transportation Needs: Not on file  Physical Activity: Not on file  Stress:  Not on file  Social Connections: Not on file  Intimate Partner Violence: Not on file   Family History  Problem Relation Age of Onset   Breast cancer Neg Hx    Allergies  Allergen Reactions   Megace [Megestrol] Rash   Penicillins Rash and Other (See Comments)    Has patient had a PCN reaction causing immediate rash, facial/tongue/throat swelling, SOB or lightheadedness with hypotension: Unknown Has patient had a PCN reaction causing severe rash involving mucus membranes or skin necrosis: Unknown Has patient had a PCN reaction that required hospitalization: Unknown Has patient had a PCN reaction occurring within the last 10  years: No If all of the above answers are "NO", then may proceed with Cephalosporin use.    Ibuprofen Hypertension   Pravastatin Other (See Comments)    Muscle pain, extreme constipation   Current Outpatient Medications:    dexamethasone (DECADRON) 4 MG tablet, Take 2 tablets (8 mg total) by mouth daily for 3 days. Start the day after chemotherapy. Take with food., Disp: 30 tablet, Rfl: 1   Easy Touch Lancets 30G/Twist MISC, , Disp: , Rfl:    ergocalciferol (VITAMIN D2) 1.25 MG (50000 UT) capsule, Take 1 capsule (50,000 Units total) by mouth once a week., Disp: 30 capsule, Rfl: 3   lidocaine-prilocaine (EMLA) cream, Apply 1 Application topically as needed. Apply to port 1 hour prior to chemotherapy. Cover with plastic wrap., Disp: 30 g, Rfl: 2   losartan (COZAAR) 50 MG tablet, Take 50 mg by mouth daily., Disp: , Rfl:    ondansetron (ZOFRAN) 8 MG tablet, Take 1 tablet (8 mg total) by mouth every 8 (eight) hours as needed for nausea or vomiting. Start on the third day after chemotherapy. (Patient not taking: Reported on 05/04/2022), Disp: 30 tablet, Rfl: 1   prochlorperazine (COMPAZINE) 10 MG tablet, Take 1 tablet (10 mg total) by mouth every 6 (six) hours as needed for nausea or vomiting. (Patient not taking: Reported on 03/15/2022), Disp: 30 tablet, Rfl: 1   tirzepatide (MOUNJARO) 5 MG/0.5ML Pen, INJECT 5 MG UNDER THE SKIN EVERY 7 DAYS, Disp: , Rfl:  No current facility-administered medications for this visit.  Facility-Administered Medications Ordered in Other Visits:    heparin lock flush 100 unit/mL, 500 Units, Intravenous, Once, [name], MD   sodium chloride flush (NS) 0.9 % injection 10 mL, 10 mL, Intravenous, PRN, [name], MD  Review of Systems General:  no complaints Skin: no complaints Eyes: no complaints HEENT: no complaints Breasts: no complaints Pulmonary: no complaints Cardiac: no complaints Gastrointestinal: no complaints Genitourinary/Sexual: no  complaints Ob/Gyn: positive for pelvic pain Musculoskeletal: no complaints Hematology: no complaints Neurologic/Psych: no complaints   Physical exam:  There were no vitals filed for this visit.  GENERAL: Patient is a well appearing female in no acute distress. Obese.  HEENT:  atraumatic normocephalic LUNGS:  Clear to auscultation bilaterally.   HEART:  Regular rate and rhythm.  ABDOMEN:  Soft, nontender.   NEURO:  Nonfocal. Well oriented.  Appropriate affect.   Pelvic: deferred  Radiology  PET at [facility] on 01/19/22.  Head & Neck:  - Visualized Head: No suspicious tracer uptake.  - Lymph Nodes: No suspicious tracer uptake. No lymphadenopathy.  - Thyroid: No suspicious tracer uptake.   Chest:  - Lymph Nodes & Mediastinum: No suspicious tracer uptake.  - Thoracic Vasculature: No suspicious tracer uptake.  - Heart & Pericardium: No suspicious tracer uptake.  - Lungs & Pleura: No suspicious tracer uptake.  Abdomen & Pelvis:  - Liver:  No suspicious tracer uptake.  - Biliary & Gallbladder: No suspicious tracer uptake. Cholelithiasis.  - Spleen: No suspicious tracer uptake.  - Pancreas: No suspicious tracer uptake.  - Adrenal Glands: No suspicious tracer uptake.  - Kidneys: No suspicious tracer uptake.  - Genitourinary: Focal area of markedly increased FDG activity in the fundus of the uterus (series 3 image 214).  - Abdominal & Pelvic Vasculature: No suspicious tracer uptake.  - Gastrointestinal: No suspicious tracer uptake.  - Peritoneum, Mesentery, & Retroperitoneum: No suspicious tracer uptake.  - Lymph Nodes: There is a prominent right pelvic lymph node measuring up to 1.1 cm with markedly increased FDG activity (series 3 image 211). There is also a small presacral node with increased FDG avidity (series 3 image 95).   Musculoskeletal:  - Bones:  No suspicious tracer uptake.  - Soft Tissues: No suspicious tracer uptake.   IMPRESSION:  1.  Focal area of markedly increased  FDG activity in the fundus of the uterus which may represent uterine cancer  2.  Subcentimeter right pelvic and presacral lymph nodes with intense FDG activity concerning for nodal metastases.   PET/CT 04/27/22  FINDINGS:  Examination is limited by poor penetration secondary to patient habitus.  Findings below are within this limitation.   Visualized Head & Neck:  - Lymph Nodes: No hypermetabolic cervical lymphadenopathy.  - Thyroid: No suspicious tracer uptake.  - Other: No suspicious tracer uptake.   Chest:  - Lymph Nodes: No hypermetabolic axillary, mediastinal, or hilar  lymphadenopathy.  - Lungs & Pleura: No suspicious tracer uptake. No pulmonary  consolidation, concerning nodules or significant effusions.  - Heart & Pericardium: No suspicious tracer uptake. Right chest port  terminates in the right atrium. Coronary calcifications.   Abdomen & Pelvis:  - Lymph Nodes: Interval decrease in size of the previously seen right  pelvic and presacral lymph nodes, now with FDG activity less than blood  pool. These are difficult to measure due to streak artifact and photon  starvation due to patient habitus. No new hypermetabolic abdominal, pelvic,  mesenteric, or inguinal lymphadenopathy.  - Liver:  No focal hypermetabolic activity above background liver.  - Biliary & Gallbladder: No suspicious tracer uptake. Cholelithiasis.  - Spleen: No focal hypermetabolic activity above background spleen. Normal  activity relative to liver.  - Pancreas: No suspicious tracer uptake.  - Adrenal Glands: No suspicious tracer uptake.  - Kidneys: Normal urinary excretion without hydronephrosis. No  hypermetabolic masses.  - Genitourinary: Enlarged uterus with interval increase in multifocal  marked FDG activity involving the uterine myometrium and  endometrial/myometrial junction of the fundus and body. The dominant  portion of activity is located in the fundus and demonstrates activity  greater than  liver. The endometrial cavity is dilated and hypoattenuating  centrally, likely representing endometrial fluid, increased from prior.  Urinary bladder is decompressed.  - Vasculature: No suspicious tracer uptake.  - Gastrointestinal: Physiologic uptake without suspicious focal uptake.  - Peritoneum: No suspicious tracer uptake.   Musculoskeletal:  - Bones: No suspicious osseous lesions.  - Soft Tissues: No suspicious hypermetabolic lesions.   IMPRESSION:   1. Worsening multifocal hypermetabolic activity in the uterine myometrium  and myometrial/endometrial junction, greatest in the fundus. Though  findings could be treatment related, worsening tumor burden is favored.   2. Progressive uterine enlargement and increased endometrial fluid.  Correlate for cervical stenosis and consider pelvic MRI as clinically  indicated.   3. Decreased size and  resolved FDG activity of the previously seen right  pelvic and presacral nodes. No new hypermetabolic nodal metastases.    Assessment:  Kathryn Maddox is a 71 y.o. female diagnosed with grade 1 endometrial cancer 2/22 on endometrial biopsy. Morbid obesity with BMI 72. MRI with 13 mm stripe. No or minimal myometrial invasion, less than 50%, suggesting stage 1a disease by imaging.  Dr Leafy Ro placed [place] IUD in clinic 4/22. Repeat biopsy 03/24/21 showed persistent grade 1 cancer.  Repeat MRI 10/22 stable. Unable to tolerate norethindrone and IUD expelled.  S/p external beam radiation and vaginal brachytherapy 4/23. PET + findings indicate recurrent disease with possible metastasis to the pelvic lymph nodes 8/23.   Treated with four cycles of carboplatin/taxol chemotherapy and pembrolizumab added with cycle 2.   Patient had a lot of difficulty tolerating chemotherapy so fall with extreme fatigue, weakness and poor appetite.   Repeat PET CT scan done at [facility] 11/23 showed interval decrease in the size of previously seen right pelvic and presacral lymph  nodes.  However there is enlarged uterus with interval increase in multifocal marked FDG avidity involving the uterine myometrium and endometrial/myometrial junction of the fundus and body concerning for worsening tumor burden.  She is not interested in pursuing further systemic treatment options.  Medical co-morbidities complicating care: HTN and diabetes non-insulin dependent, morbid obesity (BMI 72) and sleep apnea Plan:       Problem List Items Addressed This Visit            Genitourinary    Endometrial cancer ([place]) - Primary         We discussed treatment goals of care.  At this point she is most interested in quality of life.  We reviewed that it is hard to determine prognosis.  Discussed that the grade 1 endometrial cancer is not posing an immediate life threatening problem.  Although the nodal disease may have regressed with chemotherapy, uterine disease seems more prominent, but she is not bleeding.  Do not think that hysterectomy would be helpful at this point in view of nodal disease and she is a poor surgical candidate.  I suggested that it may be helpful to treat her with Letrozole, an aromatase inhibitor that inhibits production of estrogen in the adipose tissue.  This could provide benefit in the setting of a grade 1 endometrial cancer that should be hormonally responsive, as progestin IUD and oral progestins have not been well tolerated. Discussed that Letrozole generally does not produce side effects, but most common side effect is joint aches.  She is in agreement with this plan and will be seeing Dr Darrall Dears soon and can discuss the option of starting Letrozole.   We will request tumor testing for p53 and mismatch repair protein expression on her endometrial biopsy.   Mellody Drown, MD

## 2022-05-04 NOTE — Progress Notes (Deleted)
[facility]  Progress Note  Patient Care Team: Sofie Hartigan, MD as PCP - General (Family Medicine) Clent Jacks, RN as Oncology Nurse Navigator [place], [place], MD as Referring Physician (Gynecologic Oncology) Noreene Filbert, MD as Consulting Physician (Radiation Oncology) Shirline Frees, MD as Referring Physician (Radiation Oncology) Lonia Farber, MD as Consulting Physician (Endocrinology)   Name of the patient: Kathryn Maddox  [id number]  July 06, 1950   Date of visit: 05/04/22  Diagnosis/Chief complaint- Endometrial Cancer  Interval history- Patient is 48 female diagnosed with medically inoperable uterine cancer. Initially diagnosed with grade 1 endometrial cancer, suggestive of 1a disease on imaging s/p IUD, persistent disease on repeat biopsy, unable to tolerate norethindrone, IUD expelled, now s/p external beam radiation at [place] followed by 4 fractions of vaginal brachytherapy at [facility], completed 10/04/21.   PET 01/19/22 showed focal area of increased FDG activity in fundus of uterus and right pelvic and presacral LN concerning for nodal metastases. She has been receiving carb-taxol, started 02/24/2022 with [facility] s/p 4 cycles, pembrolizumab added for cycle 2. She has tolerated chemo poorly.   PET/CT on 04/27/22 demonstrates progressive disease with worsening multifocal hypermetabolic activity in the uterine myometrium and myometrial/endometrial junction, greatest in the fundus. Though possibly treatment related, favors progression.   Last chemo was on 04/26/22.   She feels poorly post chemotherapy and is concerned about impact of treatments on her quality of life. She lives alone. She is considering hospice.     Gynecologic Oncology History Aimar Shrewsbury is a pleasant G24 female with multiple medical issues including morbid obesity, poorly controlled diabetes, and sleep apnea who is seen in consultation from Dr. Leonides Schanz for  grade 1 endometrial cancer, s/p IUD placement 12/02/20 as she is poor surgical candidate.   She has a long history of abnormal vaginal bleeding and menorrhagia and anemia due to chronic blood loss going back to her 30's. She had a syncopal episode from losing so much blood. In [place], had 9 D&Cs with hysteroscopy, last one was in 2010, and bleeding always continued. She was treated with progesterone injections to stop bleeding. In '92 she had precancerous cells in uterus and was seeing oncologist for this. Was supposed to get a hysterectomy with oncologist, but had a bowel obstruction and surgery was denied until she was recovered. Based on what she described she had a ventral hernia that was repaired and she does not know if bowel was resected or not. She had not been to gynecologist since 2011 until she was seen by Dr. Leonides Schanz 07/30/2020. She has had postmenopausal bleeding x 7-8 years.   Her work up has included pelvic US, EMBx, and labs.    Pelvic US 08/14/2020 at [facility]: Uterus: Measurements: 7.7 x 5.3 x 5.8 cm = volume: 123 mL. No fibroids or other mass visualized. Endometrium: Thickness: 10 mm.  No focal abnormality visualized. Right ovary: Nonvisualized. Left ovary: Nonvisualized. Other findings: No abnormal free fluid.   08/14/2020 EMBX showed a WELL DIFFERENTIATED ENDOMETRIAL ADENOCARCINOMA   She was previously on Megace and had a rash.   Opted for IUD for conservative management.   03/24/2021 EMBX - ENDOMETRIOID CARCINOMA, LOW-GRADE (FIGO 1), WITH TREATMENT EFFECT.    Pelvic MRI 04/07/2021 FINDINGS:  -- Uterus: Measures 10.2 x 6.3 x 7.5 cm (volume = 250 cm^3). Endometrial thickness measures 13 mm, without significant change compared to previous study. IUD again noted. Indistinct endometrial-myometrial junction is suspicious for superficial myometrial invasion, however there is no  evidence of deep (> 50%) myometrial invasion. Cervix and vagina are unremarkable. -- Bilateral ovaries:  Appears normal. No ovarian or adnexal masses IMPRESSION: Stable endometrial thickening measuring 13 mm, with IUD again noted. Probable superficial myometrial invasion, also without significant change. (FIGO stage IA). No evidence of pelvic metastatic disease. Sigmoid colon diverticulosis, without evidence of diverticulitis.   06/09/2021 Exam IUD noted to be expelled.   She is having ongoing burning and frequency of urination. 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 She has not seen urology for management. Continues to have some fecal incontinence/urgency post radiation and occasional intermittent diarrhea. Bleeding has stopped and pain has resolved.   Past Medical History:  Diagnosis Date   Carpal tunnel syndrome on both sides 03/2018   Cellulitis and abscess of lower extremity 03/2018   bilateral lower extrems, treated with antibx x 10 days   Diabetes mellitus without complication ([place])    Dyspnea 03/2018   with any exercise, due to weight   Eczema    Endometrial cancer ([place]) 08/26/2020   Hyperlipidemia    Hypertension    Morbid obesity with BMI of 70 and over, adult ([place]) 03/2018   Sleep apnea 03/2018   uses a cpap   Past Surgical History:  Procedure Laterality Date   CARPAL TUNNEL RELEASE Left 04/12/2018   Procedure: CARPAL TUNNEL RELEASE;  Surgeon: Hessie Knows, MD;  Location: [facility] ORS;  Service: Orthopedics;  Laterality: Left;   COLONOSCOPY WITH PROPOFOL N/A 12/02/2019   Procedure: COLONOSCOPY WITH PROPOFOL;  Surgeon: Toledo, Benay Pike, MD;  Location: [facility] ENDOSCOPY;  Service: Gastroenterology;  Laterality: N/A;   DILATION AND CURETTAGE OF UTERUS  03/2018   has had 10 d & c's with hysteroscopy   HERNIA REPAIR  2774   umbilical w/ obstructed bowel   IR IMAGING GUIDED PORT INSERTION  02/17/2022   LIPOMA EXCISION Right 1997   shoulder   obstructive bowel  surgery  01/2010   Social History   Socioeconomic History   Marital status: Divorced    Spouse name: Not on file   Number of children: Not on file   Years of education: Not on file   Highest education level: Not on file  Occupational History   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[place], now retired  Tobacco Use   Smoking status: Never    Passive exposure: Never   Smokeless tobacco: Never  Vaping Use   Vaping Use: Never used  Substance and Sexual Activity   Alcohol use: Yes    Comment: rarely   Drug use: Never   Sexual activity: Not Currently  Other Topics Concern   Not on file  Social History Narrative   Not on file   Social Determinants of Health   Financial Resource Strain: Not on file  Food Insecurity: Not on file  Transportation Needs: Not on file  Physical Activity: Not on file  Stress: Not on file  Social Connections: Not on file  Intimate Partner Violence: Not on file   Family History  Problem Relation Age of Onset   Breast cancer Neg Hx    Allergies  Allergen Reactions   Megace [Megestrol] Rash   Penicillins Rash and Other (See Comments)    Has patient had a PCN reaction causing immediate rash, facial/tongue/throat swelling, SOB or lightheadedness with hypotension: Unknown Has patient had a PCN reaction causing severe rash involving  mucus membranes or skin necrosis: Unknown Has patient had a PCN reaction that required hospitalization: Unknown Has patient had a PCN reaction occurring within the last 10 years: No If all of the above answers are "NO", then may proceed with Cephalosporin use.    Ibuprofen Hypertension   Pravastatin Other (See Comments)    Muscle pain, extreme constipation   Current Outpatient Medications:    dexamethasone (DECADRON) 4 MG tablet, Take 2 tablets (8 mg total) by mouth daily for 3 days. Start the day after chemotherapy. Take with food., Disp: 30 tablet, Rfl: 1   Easy Touch Lancets 30G/Twist MISC,  , Disp: , Rfl:    ergocalciferol (VITAMIN D2) 1.25 MG (50000 UT) capsule, Take 1 capsule (50,000 Units total) by mouth once a week., Disp: 30 capsule, Rfl: 3   lidocaine-prilocaine (EMLA) cream, Apply 1 Application topically as needed. Apply to port 1 hour prior to chemotherapy. Cover with plastic wrap., Disp: 30 g, Rfl: 2   losartan (COZAAR) 50 MG tablet, Take 50 mg by mouth daily., Disp: , Rfl:    ondansetron (ZOFRAN) 8 MG tablet, Take 1 tablet (8 mg total) by mouth every 8 (eight) hours as needed for nausea or vomiting. Start on the third day after chemotherapy. (Patient not taking: Reported on 05/04/2022), Disp: 30 tablet, Rfl: 1   prochlorperazine (COMPAZINE) 10 MG tablet, Take 1 tablet (10 mg total) by mouth every 6 (six) hours as needed for nausea or vomiting. (Patient not taking: Reported on 03/15/2022), Disp: 30 tablet, Rfl: 1   tirzepatide (MOUNJARO) 5 MG/0.5ML Pen, INJECT 5 MG UNDER THE SKIN EVERY 7 DAYS, Disp: , Rfl:  No current facility-administered medications for this visit.  Facility-Administered Medications Ordered in Other Visits:    heparin lock flush 100 unit/mL, 500 Units, Intravenous, Once, [name], MD   sodium chloride flush (NS) 0.9 % injection 10 mL, 10 mL, Intravenous, PRN, [name], MD  Review of Systems General:  weakness & fatigue Skin: no complaints Eyes: no complaints HEENT: no complaints Breasts: no complaints Pulmonary: no complaints Cardiac: no complaints Gastrointestinal: alternating constipation and diarrhea Genitourinary/Sexual: no complaints Ob/Gyn: no complaints Musculoskeletal: no complaints Hematology: no complaints Neurologic/Psych: no complaints   Objective:  Physical exam:  There were no vitals filed for this visit.  ***GENERAL: Patient is a well appearing female in no acute distress. Obese.  HEENT:  atraumatic normocephalic LUNGS:  Clear to auscultation bilaterally.   HEART:  Regular rate and rhythm.  ABDOMEN:  Soft,  nontender.   NEURO:  Nonfocal. Well oriented.  Appropriate affect.   Pelvic: deferred   Assessment:  Mane Consolo is a 71 y.o. female diagnosed with grade 1 endometrial cancer 2/22 on endometrial biopsy. Morbid obesity with BMI 72. MRI with 13 mm stripe. No or minimal myometrial invasion, less than 50%, suggesting stage 1a disease by imaging.  Dr Leafy Ro placed [place] IUD in clinic 4/22. Repeat biopsy 03/24/21 showed persistent grade 1 cancer.  Repeat MRI 10/22 stable. Unable to tolerate norethindrone and IUD expelled. Challenging pelvic exam. S/p external beam radiation and vaginal brachytherapy. Vaginal agglutination to the cervix at the apex. PET + findings indicate recurrent disease with possible metastasis to the pelvic lymph nodes.   Medical co-morbidities complicating care: HTN and diabetes non-insulin dependent, morbid obesity (BMI 72) and sleep apnea Plan:   Continue close surveillance with radiation oncology. PET scan ordered 04/27/2022.   We discussed treatment goals of care.  At this point she is most interested in quality of life.  We reviewed that it is hard to determine prognosis at this time given one imaging study.  We will be able to determine better whether or not she has indolent or aggressive disease based on her subsequent imaging.  I do think it be worthwhile to see the medical oncology team and discussed the different options that exist including hormonal, chemotherapy and biologic therapy.  We will request tumor testing for p53 and mismatch repair protein expression on her endometrial biopsy.   Continue close follow-up with repeat visit after her PET scan in November.  Beckey Rutter, DNP, AGNP-C [place] at [facility] [phone] (clinic)  I personally interviewed and examined the patient. Agreed with the above/below plan of care. I have directly contributed to assessment and plan of care of this patient and educated and discussed with patient and family.   Mellody Drown, MD

## 2022-05-05 ENCOUNTER — Encounter: Payer: Self-pay | Admitting: Internal Medicine

## 2022-05-05 LAB — FOLATE RBC
Folate, Hemolysate: 339 ng/mL
Folate, RBC: 1215 ng/mL (ref >498)
Hematocrit: 27.9 % — ABNORMAL LOW (ref 34.0–46.6)

## 2022-05-06 ENCOUNTER — Telehealth: Payer: Self-pay | Admitting: *Deleted

## 2022-05-06 NOTE — Telephone Encounter (Signed)
Patient called asking about the pills that Dr A was supposed to order for her per Dr Fransisca Connors  "She is in agreement with this plan and will be seeing Dr Darrall Dears soon and can discuss the option of starting Letrozole. "  Please advise

## 2022-05-09 ENCOUNTER — Other Ambulatory Visit: Payer: Self-pay | Admitting: Internal Medicine

## 2022-05-09 DIAGNOSIS — G629 Polyneuropathy, unspecified: Secondary | ICD-10-CM

## 2022-05-18 ENCOUNTER — Encounter: Payer: Self-pay | Admitting: Internal Medicine

## 2022-05-18 ENCOUNTER — Inpatient Hospital Stay (HOSPITAL_BASED_OUTPATIENT_CLINIC_OR_DEPARTMENT_OTHER): Payer: HMO | Admitting: Hospice and Palliative Medicine

## 2022-05-18 ENCOUNTER — Inpatient Hospital Stay (HOSPITAL_BASED_OUTPATIENT_CLINIC_OR_DEPARTMENT_OTHER): Payer: HMO | Admitting: Internal Medicine

## 2022-05-18 ENCOUNTER — Ambulatory Visit
Admission: RE | Admit: 2022-05-18 | Discharge: 2022-05-18 | Disposition: A | Discharge status: Home / Self Care | Payer: HMO | Source: Ambulatory Visit | Attending: Radiation Oncology | Admitting: Radiation Oncology

## 2022-05-18 ENCOUNTER — Other Ambulatory Visit: Payer: Self-pay | Admitting: *Deleted

## 2022-05-18 VITALS — BP 124/63 | HR 85 | Temp 98.7°F | Resp 20 | Wt 318.3 lb

## 2022-05-18 DIAGNOSIS — C541 Malignant neoplasm of endometrium: Secondary | ICD-10-CM

## 2022-05-18 DIAGNOSIS — Z5111 Encounter for antineoplastic chemotherapy: Secondary | ICD-10-CM | POA: Diagnosis not present

## 2022-05-18 DIAGNOSIS — Z515 Encounter for palliative care: Secondary | ICD-10-CM

## 2022-05-18 DIAGNOSIS — R531 Weakness: Secondary | ICD-10-CM | POA: Insufficient documentation

## 2022-05-18 NOTE — Progress Notes (Signed)
[place] at [facility] Telephone:([phone]) [phone] [phone]) [phone]   Name: Kathryn Maddox Date: 05/18/2022 MRN: [id number]  DOB: March 02, 1951  Patient Care Team: Sofie Hartigan, MD as PCP - General (Family Medicine) Clent Jacks, RN as Oncology Nurse Navigator [place], [place], MD as Referring Physician (Gynecologic Oncology) Noreene Filbert, MD as Consulting Physician (Radiation Oncology) Shirline Frees, MD as Referring Physician (Radiation Oncology) Lonia Farber, MD as Consulting Physician (Endocrinology)    REASON FOR CONSULTATION: Kathryn Maddox is a 71 y.o. female with multiple medical problems including morbid obesity, hypertension, hyperlipidemia, stage III endometrial cancer status post EBRT and vaginal brachytherapy.  Patient last received cycle 4 carbo/Taxol/Keytruda on 04/26/2022.  She had poor tolerance of chemotherapy with profound fatigue/weakness.  Palliative care was consulted to help address goals.  SOCIAL HISTORY:     reports that she has never smoked. She has never been exposed to tobacco smoke. She has never used smokeless tobacco. She reports current alcohol use. She reports that she does not use drugs.  Patient is widowed.  She has no children she lives at home alone.  She is originally from [place], [place] and moved here for warmer weather.  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  ADVANCE DIRECTIVES:  Not on file  CODE STATUS:   PAST MEDICAL HISTORY: Past Medical History:  Diagnosis Date   Carpal tunnel syndrome on both sides 03/2018   Cellulitis and abscess of lower extremity 03/2018   bilateral lower extrems, treated with antibx x 10 days   Diabetes mellitus without complication ([place])    Dyspnea 03/2018   with any exercise, due to weight   Eczema    Endometrial cancer ([place]) 08/26/2020   Hyperlipidemia    Hypertension     Morbid obesity with BMI of 70 and over, adult ([place]) 03/2018   Sleep apnea 03/2018   uses a cpap    PAST SURGICAL HISTORY:  Past Surgical History:  Procedure Laterality Date   CARPAL TUNNEL RELEASE Left 04/12/2018   Procedure: CARPAL TUNNEL RELEASE;  Surgeon: Hessie Knows, MD;  Location: [facility] ORS;  Service: Orthopedics;  Laterality: Left;   COLONOSCOPY WITH PROPOFOL N/A 12/02/2019   Procedure: COLONOSCOPY WITH PROPOFOL;  Surgeon: Toledo, Benay Pike, MD;  Location: [facility] ENDOSCOPY;  Service: Gastroenterology;  Laterality: N/A;   DILATION AND CURETTAGE OF UTERUS  03/2018   has had 10 d & c's with hysteroscopy   HERNIA REPAIR  4536   umbilical w/ obstructed bowel   IR IMAGING GUIDED PORT INSERTION  02/17/2022   LIPOMA EXCISION Right 1997   shoulder   obstructive bowel surgery  01/2010    HEMATOLOGY/ONCOLOGY HISTORY:  Oncology History  Endometrial cancer ([place])  07/30/2020 Initial Diagnosis   She was seen by Dr. Leonides Schanz of OBGYN for post menopausal bleeding x 7-8 years.    08/14/2020 Imaging   Pelvic US showed  FINDINGS: Uterus: Measurements: 7.7 x 5.3 x 5.8 cm = volume: 123 mL. No fibroids or other mass visualized. Endometrium: Thickness: 10 mm.  No focal abnormality visualized. Right ovary: Nonvisualized. Left ovary: Nonvisualized. Other findings: No abnormal free fluid.   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 No or minimal myometrial invasion, less than 50%, suggesting stage 1a disease by imaging.  -She was not a surgical candidate due to morbid obesity.  -S/p IUD Mirena  by Dr. Leafy Ro 4/22 - Repeat EMBx on 03/24/21 showed persistent grade 1 endometrioid cancer.  - Unable to tolerate Norethidrone and IUD expelled.       Radiation Therapy   - S/p EBRT at [place] followed by 4 fractions of vaginal brachytherapy at [place] completed on 9/67/5916.   - complicated by radiation cystitis and diarrhea. Was  seen by Dr. Delaine Lame of ID. Diagnosed with VRE colonization. Symptoms have largely improved since.     01/19/2022 Progression   PET scan performed at [facility]-  IMPRESSION:  1.  Focal area of markedly increased FDG activity in the fundus of the uterus which may represent uterine cancer  2.  Subcentimeter right pelvic and presacral lymph nodes with intense FDG activity concerning for nodal metastases.    02/24/2022 -  Chemotherapy   Patient is on Treatment Plan :  Dose reduced.   Carboplatin AUC 4 + Paclitaxel 135 mg/m2 q21d due to functional status and pre-existing peripheral neuropathy.        ALLERGIES:  is allergic to megace [megestrol], penicillins, ibuprofen, and pravastatin.  MEDICATIONS:  Current Outpatient Medications  Medication Sig Dispense Refill   dexamethasone (DECADRON) 4 MG tablet Take 2 tablets (8 mg total) by mouth daily for 3 days. Start the day after chemotherapy. Take with food. (Patient not taking: Reported on 05/18/2022) 30 tablet 1   Easy Touch Lancets 30G/Twist MISC  (Patient not taking: Reported on 05/18/2022)     ergocalciferol (VITAMIN D2) 1.25 MG (50000 UT) capsule Take 1 capsule (50,000 Units total) by mouth once a week. (Patient not taking: Reported on 05/18/2022) 30 capsule 3   lidocaine-prilocaine (EMLA) cream Apply 1 Application topically as needed. Apply to port 1 hour prior to chemotherapy. Cover with plastic wrap. 30 g 2   losartan (COZAAR) 50 MG tablet Take 50 mg by mouth daily.     ondansetron (ZOFRAN) 8 MG tablet Take 1 tablet (8 mg total) by mouth every 8 (eight) hours as needed for nausea or vomiting. Start on the third day after chemotherapy. (Patient not taking: Reported on 05/04/2022) 30 tablet 1   prochlorperazine (COMPAZINE) 10 MG tablet Take 1 tablet (10 mg total) by mouth every 6 (six) hours as needed for nausea or vomiting. (Patient not taking: Reported on 03/15/2022) 30 tablet 1   tirzepatide (MOUNJARO) 5 MG/0.5ML Pen INJECT 5 MG UNDER THE SKIN  EVERY 7 DAYS     No current facility-administered medications for this visit.    VITAL SIGNS: There were no vitals taken for this visit. There were no vitals filed for this visit.  Estimated body mass index is 60.14 kg/m as calculated from the following:   Height as of 02/28/22: '5\' 1"'$  (1.549 m).   Weight as of an earlier encounter on 05/18/22: 318 lb 4.8 oz (144.4 kg).  LABS: CBC:    Component Value Date/Time   WBC 1.9 (L) 05/04/2022 1119   HGB 9.2 (L) 05/04/2022 1119   HCT 27.9 (L) 05/04/2022 1119   HCT 28.1 (L) 05/04/2022 1119   PLT 178 05/04/2022 1119   MCV 85.2 05/04/2022 1119   NEUTROABS 1.1 (L) 05/04/2022 1119   LYMPHSABS 0.5 (L) 05/04/2022 1119   MONOABS 0.2 05/04/2022 1119   EOSABS 0.0 05/04/2022 1119   BASOSABS 0.0 05/04/2022 1119   Comprehensive Metabolic Panel:    Component Value Date/Time   NA 133 (L) 05/04/2022 1134   K 3.6 05/04/2022 1134   CL 99 05/04/2022 1134   CO2 25 05/04/2022 1134  BUN 13 05/04/2022 1134   CREATININE 0.67 05/04/2022 1134   GLUCOSE 135 (H) 05/04/2022 1134   CALCIUM 9.4 05/04/2022 1134   AST 25 05/04/2022 1134   ALT 27 05/04/2022 1134   ALKPHOS 45 05/04/2022 1134   BILITOT 0.6 05/04/2022 1134   PROT 7.0 05/04/2022 1134   ALBUMIN 3.6 05/04/2022 1134    RADIOGRAPHIC STUDIES: No results found.  PERFORMANCE STATUS (ECOG) : 2 - Symptomatic, <50% confined to bed  Review of Systems Unless otherwise noted, a complete review of systems is negative.  Physical Exam General: NAD Cardiovascular: regular rate and rhythm Pulmonary: clear ant fields Abdomen: soft, nontender, + bowel sounds GU: no suprapubic tenderness Extremities: no edema, no joint deformities Skin: no rashes Neurological: Weakness but otherwise nonfocal  IMPRESSION: Patient has had profound weakness and fatigue following chemotherapy.  She has been slow to recover, although her last chemotherapy was 3 weeks ago.  Patient is opted not to proceed with further  chemotherapy.  Patient saw Dr. Darrall Dears today with plans for 4-week follow-up and possible future consideration of letrozole.  At baseline, patient lives at home alone.  She has difficulty with transportation issues.  She was asking about home resources.  She does have Medicaid.  Will send referral to social work to [employer].  Patient might benefit from [facility].  Will also send referral for community palliative care.  Patient states that she is still trying to be independent at home but her performance status has been poor following the last chemotherapy.  Will refer to home health PT to see if we can improve her strength and functioning.  Symptomatically, she has peripheral neuropathy.  Discussed option for acupuncture.  Nearest acupuncture to her residence would be [facility].  PLAN: -Continue current scope of treatment -Referrals to community palliative care, home health, and social work -Follow-up in 1 month   Patient expressed understanding and was in agreement with this plan. She also understands that She can call the clinic at any time with any questions, concerns, or complaints.     Time Total: 15 minutes  Visit consisted of counseling and education dealing with the complex and emotionally intense issues of symptom management and palliative care in the setting of serious and potentially life-threatening illness.Greater than 50%  of this time was spent counseling and coordinating care related to the above assessment and plan.  Signed by: Altha Harm, PhD, NP-C

## 2022-05-18 NOTE — Progress Notes (Signed)
[facility] OFFICE PROGRESS NOTE  Patient Care Team: Sofie Hartigan, MD as PCP - General (Family Medicine) Clent Jacks, RN as Oncology Nurse Navigator [place], [place], MD as Referring Physician (Gynecologic Oncology) Noreene Filbert, MD as Consulting Physician (Radiation Oncology) Shirline Frees, MD as Referring Physician (Radiation Oncology) Lonia Farber, MD as Consulting Physician (Endocrinology)  CURRENT TREATMENT-  Carboplatin AUC 4 and Paclitaxel 135 mg/m2 q21 days started on 02/24/2022. Pembrolizumab added with cycle 2  ASSESSMENT & PLAN:  Kathryn Maddox is a 71 yo F with pmh of morbid obesity, HTN and HLD was diagnosed with endometrioid cancer, grade 1, staged 1A per imaging in Feb 2022, referred to medical oncology to discuss about systemic treatment options upon progression.    #Endometrioid cancer, Stage III per imaging, pMMR and p53 wildtype - diagnosed with stage 1A grade 1 endometrioid cancer in Feb 2022 after post menopausal bleeding.  - not a surgical candidate due to morbid obesity  - s/p Mirena IUD with persistance of disease evident on repeat EMBx done in 10/22  - could not tolerate Norethindrone  - S/p EBRT at [facility] followed by 4 fractions of vaginal brachytherapy at [facility] completed on 10/04/2021.  - PET/CT from 01/19/2022 showed focal area of increased FDG activity in fundus of the uterus and right pelvic and presacral lymph nodes concerning for nodal metastases.    -Patient completed 4 cycles of carboplatin, Taxol and pembrolizumab (added with cycle 2) on 04/26/2022.  Had difficulty tolerating with generalized weakness, fatigue and worsening neuropathy.  Repeat PET CT scan done at [facility] showed interval decrease in the size of previously seen right pelvic and presacral lymph nodes.  However there is enlarged uterus with interval increase in multifocal marked FDG avidity concerning for worsening tumor burden.   -Patient continues to  feel weak.  Her oral intake has slightly improved.  Energy level continues to be low.  Her bowel movements vary between constipation and diarrhea.  Her neuropathy has worsened and she could not tolerate gabapentin and Cymbalta due to GI upset.  Discussed about alternatives with acupuncture or massage.  She was also looking into services for podiatry to come home.  She is also planned to see palliative care.  I discussed with Josh who will talk to her about options for above services.  Now that she is off chemotherapy, hopefully her physical strength and appetite will improve.  Patient would like to wait for few weeks before she starts letrozole.  #Abdominal pelvic pain,  -controlled.  Could be from prior radiation -Morphine 15 mg every 6 as needed.    #Neuropathy from long standing diabetes -Worsened on Taxol.  Patient could not tolerate gabapentin and Cymbalta due to GI upset. - # access - port   RTC in 4 weeks for MD visit, labs  No orders of the defined types were placed in this encounter.   All questions were answered. The patient knows to call the clinic with any problems, questions or concerns. The total time spent in the appointment was 30 minutes encounter with patients including review of chart and various tests results, discussions about plan of care and coordination of care plan   [name], MD 05/18/2022 9:07 AM  INTERVAL HISTORY: [name] with diagnosis of stage III endometrioid cancer s/p Mirena IUD, could not tolerate norethindrone, EBRT and vaginal brachytherapy now on systemic treatment.  Patient's last chemotherapy 3 weeks ago.  She continues to feel weak and tired.  Her appetite is low.  Energy level is low.  Bowel movements fluctuate.  Due to her feeling weak she has a lot of emotions.  REVIEW OF SYSTEMS:   Pertinent positive ROS as above. All other systems were reviewed with the patient and are negative.  I have reviewed the past medical  history, past surgical history, social history and family history with the patient and they are unchanged from previous note.  ALLERGIES:  is allergic to megace [megestrol], penicillins, ibuprofen, and pravastatin.  MEDICATIONS:  Current Outpatient Medications  Medication Sig Dispense Refill   dexamethasone (DECADRON) 4 MG tablet Take 2 tablets (8 mg total) by mouth daily for 3 days. Start the day after chemotherapy. Take with food. 30 tablet 1   Easy Touch Lancets 30G/Twist MISC      ergocalciferol (VITAMIN D2) 1.25 MG (50000 UT) capsule Take 1 capsule (50,000 Units total) by mouth once a week. 30 capsule 3   lidocaine-prilocaine (EMLA) cream Apply 1 Application topically as needed. Apply to port 1 hour prior to chemotherapy. Cover with plastic wrap. 30 g 2   losartan (COZAAR) 50 MG tablet Take 50 mg by mouth daily.     tirzepatide (MOUNJARO) 5 MG/0.5ML Pen INJECT 5 MG UNDER THE SKIN EVERY 7 DAYS     ondansetron (ZOFRAN) 8 MG tablet Take 1 tablet (8 mg total) by mouth every 8 (eight) hours as needed for nausea or vomiting. Start on the third day after chemotherapy. (Patient not taking: Reported on 05/04/2022) 30 tablet 1   prochlorperazine (COMPAZINE) 10 MG tablet Take 1 tablet (10 mg total) by mouth every 6 (six) hours as needed for nausea or vomiting. (Patient not taking: Reported on 03/15/2022) 30 tablet 1   No current facility-administered medications for this visit.    SUMMARY OF ONCOLOGIC HISTORY: Oncology History  Endometrial cancer ([place])  07/30/2020 Initial Diagnosis   She was seen by Dr. Leonides Schanz of OBGYN for post menopausal bleeding x 7-8 years.    08/14/2020 Imaging   Pelvic US showed  FINDINGS: Uterus: Measurements: 7.7 x 5.3 x 5.8 cm = volume: 123 mL. No fibroids or other mass visualized. Endometrium: Thickness: 10 mm.  No focal abnormality visualized. Right ovary: Nonvisualized. Left ovary: Nonvisualized. Other findings: No abnormal free fluid.   08/14/2020 Initial Biopsy    EMBX showed a WELL DIFFERENTIATED ENDOMETRIAL ADENOCARCINOMA   MRI with 13 mm stripe. No or minimal myometrial invasion, less than 50%, suggesting stage 1a disease by imaging.  -She was not a surgical candidate due to morbid obesity.  -S/p IUD Mirena by Dr. Leafy Ro 4/22 - Repeat EMBx on 03/24/21 showed persistent grade 1 endometrioid cancer.  - Unable to tolerate Norethidrone and IUD expelled.       Radiation Therapy   - S/p EBRT at [facility] followed by 4 fractions of vaginal brachytherapy at [facility] completed on 6/62/9476.   - complicated by radiation cystitis and diarrhea. Was seen by Dr. Delaine Lame of ID. Diagnosed with VRE colonization. Symptoms have largely improved since.     01/19/2022 Progression   PET scan performed at [facility]-  IMPRESSION:  1.  Focal area of markedly increased FDG activity in the fundus of the uterus which may represent uterine cancer  2.  Subcentimeter right pelvic and presacral lymph nodes with intense FDG activity concerning for nodal metastases.    02/24/2022 -  Chemotherapy   Patient is on Treatment Plan :  Dose reduced.   Carboplatin AUC 4 + Paclitaxel 135 mg/m2 q21d due to  functional status and pre-existing peripheral neuropathy.        PHYSICAL EXAMINATION: ECOG PERFORMANCE STATUS: 2 - Symptomatic, <50% confined to bed  There were no vitals filed for this visit.   Filed Weights   05/18/22 0903  Weight: (!) 318 lb 4.8 oz (144.4 kg)     GENERAL:alert, no distress and comfortable SKIN: skin color, texture, turgor are normal, no rashes or significant lesions EYES: normal, Conjunctiva are pink and non-injected, sclera clear OROPHARYNX:no exudate, no erythema and lips, buccal mucosa, and tongue normal  NECK: supple, thyroid normal size, non-tender, without nodularity LYMPH:  no palpable lymphadenopathy in the cervical, axillary or inguinal LUNGS: clear to auscultation and percussion with normal breathing effort HEART: regular rate & rhythm and no  murmurs and no lower extremity edema ABDOMEN:abdomen soft, non-tender and normal bowel sounds Musculoskeletal:no cyanosis of digits and no clubbing  NEURO: alert & oriented x 3 with fluent speech, no focal motor/sensory deficits  LABORATORY DATA:  I have reviewed the data as listed    Component Value Date/Time   NA 133 (L) 05/04/2022 1134   K 3.6 05/04/2022 1134   CL 99 05/04/2022 1134   CO2 25 05/04/2022 1134   GLUCOSE 135 (H) 05/04/2022 1134   BUN 13 05/04/2022 1134   CREATININE 0.67 05/04/2022 1134   CALCIUM 9.4 05/04/2022 1134   PROT 7.0 05/04/2022 1134   ALBUMIN 3.6 05/04/2022 1134   AST 25 05/04/2022 1134   ALT 27 05/04/2022 1134   ALKPHOS 45 05/04/2022 1134   BILITOT 0.6 05/04/2022 1134   GFRNONAA >60 05/04/2022 1134    No results found for: "SPEP", "UPEP"  Lab Results  Component Value Date   WBC 1.9 (L) 05/04/2022   NEUTROABS 1.1 (L) 05/04/2022   HGB 9.2 (L) 05/04/2022   HCT 27.9 (L) 05/04/2022   HCT 28.1 (L) 05/04/2022   MCV 85.2 05/04/2022   PLT 178 05/04/2022      Chemistry      Component Value Date/Time   NA 133 (L) 05/04/2022 1134   K 3.6 05/04/2022 1134   CL 99 05/04/2022 1134   CO2 25 05/04/2022 1134   BUN 13 05/04/2022 1134   CREATININE 0.67 05/04/2022 1134      Component Value Date/Time   CALCIUM 9.4 05/04/2022 1134   ALKPHOS 45 05/04/2022 1134   AST 25 05/04/2022 1134   ALT 27 05/04/2022 1134   BILITOT 0.6 05/04/2022 1134       RADIOGRAPHIC STUDIES: I have personally reviewed the radiological images as listed and agreed with the findings in the report. No results found.

## 2022-05-18 NOTE — Progress Notes (Signed)
Radiation Oncology Follow up Note  Name: Kathryn Maddox   Date:   05/18/2022 MRN:  [id number] DOB: 1950/07/05    This 71 y.o. female presents to the clinic today for 28-monthfollow-up status post external beam radiation therapy as well as intracavitary HDR for FIGO grade 1 endometrial carcinoma with superficial invasion.  REFERRING PROVIDER: FSofie Hartigan MD  HPI: Patient is a 71year old female now out 6 months having completed external beam radiation therapy as well as intracavitary HDR for FIGO grade 1 endometrial carcinoma with superficial invasion..  She developed right pelvic and presacral lymph node involvement by PET/CT criteria with also enlarged uterus and interval increase in multifocal FDG avid lesions involving the uterine myometrium endometrium.  She has been treated with chemotherapy tolerated that poorly and now is only interested in quality of life issues.  Recommendation for letrozole has been made.  She is not having vaginal discharge or vaginal bleeding at this time.  COMPLICATIONS OF TREATMENT: none  FOLLOW UP COMPLIANCE: keeps appointments   PHYSICAL EXAM:  There were no vitals taken for this visit. Morbidly obese female wheelchair-bound in NAD.  Well-developed well-nourished patient in NAD. HEENT reveals PERLA, EOMI, discs not visualized.  Oral cavity is clear. No oral mucosal lesions are identified. Neck is clear without evidence of cervical or supraclavicular adenopathy. Lungs are clear to A&P. Cardiac examination is essentially unremarkable with regular rate and rhythm without murmur rub or thrill. Abdomen is benign with no organomegaly or masses noted. Motor sensory and DTR levels are equal and symmetric in the upper and lower extremities. Cranial nerves II through XII are grossly intact. Proprioception is intact. No peripheral adenopathy or edema is identified. No motor or sensory levels are noted. Crude visual fields are within normal range.  RADIOLOGY RESULTS:  PET CT results reviewed  PLAN: At this time I would reserve any further radiation for palliation.  I explained that to the patient.  She will continue follow-up care with medical oncology as well as GYN oncology with focus on quality of life.  Be happy to reevaluate her for any palliative treatment at this time.  I would like to take this opportunity to thank you for allowing me to participate in the care of your patient..Noreene Filbert MD

## 2022-05-19 ENCOUNTER — Telehealth: Payer: Self-pay

## 2022-05-19 ENCOUNTER — Ambulatory Visit: Payer: HMO | Admitting: Radiation Oncology

## 2022-05-19 NOTE — Telephone Encounter (Signed)
930 am.  Phone call made to patient to schedule home visit.  Patient agreeable to next Wednesday at 1130.

## 2022-05-20 ENCOUNTER — Inpatient Hospital Stay: Payer: HMO | Attending: Obstetrics and Gynecology | Admitting: Licensed Clinical Social Worker

## 2022-05-20 DIAGNOSIS — I10 Essential (primary) hypertension: Secondary | ICD-10-CM | POA: Insufficient documentation

## 2022-05-20 DIAGNOSIS — K5900 Constipation, unspecified: Secondary | ICD-10-CM | POA: Insufficient documentation

## 2022-05-20 DIAGNOSIS — E860 Dehydration: Secondary | ICD-10-CM | POA: Insufficient documentation

## 2022-05-20 DIAGNOSIS — C541 Malignant neoplasm of endometrium: Secondary | ICD-10-CM | POA: Insufficient documentation

## 2022-05-20 DIAGNOSIS — R197 Diarrhea, unspecified: Secondary | ICD-10-CM | POA: Insufficient documentation

## 2022-05-20 DIAGNOSIS — E876 Hypokalemia: Secondary | ICD-10-CM | POA: Insufficient documentation

## 2022-05-20 DIAGNOSIS — G629 Polyneuropathy, unspecified: Secondary | ICD-10-CM | POA: Insufficient documentation

## 2022-05-20 DIAGNOSIS — M545 Low back pain, unspecified: Secondary | ICD-10-CM | POA: Insufficient documentation

## 2022-05-20 DIAGNOSIS — E1142 Type 2 diabetes mellitus with diabetic polyneuropathy: Secondary | ICD-10-CM | POA: Insufficient documentation

## 2022-05-20 NOTE — Progress Notes (Signed)
[facility] Work  Initial Assessment   Kathryn Maddox is a 71 y.o. year old female contacted by phone. Clinical Social Work was referred by medical provider for assessment of psychosocial needs.   SDOH (Social Determinants of Health) assessments performed: Yes SDOH Interventions    Flowsheet Row Clinical Support from 05/20/2022 in [place] at [facility] Interventions   Food Insecurity Interventions Intervention Not Indicated  Housing Interventions Intervention Not Indicated  Transportation Interventions Intervention Not Indicated, [facility] [place] ([place]. Only)  Utilities Interventions Intervention Not Indicated  Alcohol Usage Interventions Intervention Not Indicated (Score <7)  Financial Strain Interventions Intervention Not Indicated  Physical Activity Interventions Other (Comments)  [Palliative Care NP ordered home health PT]  Stress Interventions Provide Counseling  Social Connections Interventions Intervention Not Indicated       SDOH Screenings   Food Insecurity: No Food Insecurity (05/20/2022)  Housing: Low Risk  (05/20/2022)  Transportation Needs: No Transportation Needs (05/20/2022)  Utilities: Not At Risk (05/20/2022)  Alcohol Screen: Low Risk  (05/20/2022)  Depression (PHQ2-9): Low Risk  (05/20/2022)  Financial Resource Strain: Low Risk  (05/20/2022)  Physical Activity: Inactive (05/20/2022)  Social Connections: Socially Isolated (05/20/2022)  Stress: Stress Concern Present (05/20/2022)  Tobacco Use: Low Risk  (05/18/2022)     Distress Screen completed: Yes    08/26/2020   12:56 PM  ONCBCN DISTRESS SCREENING  Screening Type Initial Screening  Distress experienced in past week (1-10) 4      Family/Social Information:  Housing Arrangement: patient lives alone , Kathryn Maddox main contact [phone]  Family members/support persons in your life? Friends Transportation concerns: no, but patient does need assistance from the  car to the door due to neuropathy Employment: Retired  .  Income source: Paediatric nurse concerns: No Type of concern: None Food access concerns: no Religious or spiritual practice: No Services Currently in place:  [employer] and Medicaid  Coping/ Adjustment to diagnosis: Patient understands treatment plan and what happens next? yes Concerns about diagnosis and/or treatment: Pain or discomfort during procedures, Afraid of cancer, How will I care for myself, and Quality of life Patient reported stressors: Anxiety/ nervousness, Adjusting to my illness, and Physical issues Hopes and/or priorities: N/A Patient enjoys  pedicures Current coping skills/ strengths: Ability for insight , Active sense of humor , Average or above average intelligence , Capable of independent living , Communication skills , Financial means , General fund of knowledge , Motivation for treatment/growth , Supportive family/friends , and Work skills     SUMMARY: Current SDOH Barriers:  Financial constraints related to fixed income, Limited social support, Level of care concerns, and ADL IADL limitations - Palliative Care NP has requested home health PT to assist patient with improvement on ADLs, due to foot neuropathy   Clinical Social Work Clinical Goal(s):  Patient will follow up with [employer] and Medicaid case manager to request assistance with transportation to non-cancer Maddox appointments, acupuncture and massage therapy to assist with back pain and foot neuropathy* as directed by SW  Interventions: Discussed common feeling and emotions when being diagnosed with cancer, and the importance of support during treatment Informed patient of the support team roles and support services at [facility] Provided CSW contact information and encouraged patient to call with any questions or concerns Referred patient to [facility]ryn Maddox [employer], Medicaid case manager and Provided patient with  information about private duty caregivers, resources available for caregiving from [employer] and Medicaid, transportation and Kathryn Maddox  calendar.  CSW will emailprivate duty caregiver list and Kathryn Maddox calendar to patient [email or web address]   Follow Up Plan: Patient will contact CSW with any support or resource needs Patient verbalizes understanding of plan: Yes    Kathryn Amas, LCSW   Patient is participating in a Managed Medicaid Plan:  Yes

## 2022-05-24 ENCOUNTER — Inpatient Hospital Stay: Payer: HMO

## 2022-05-24 NOTE — Progress Notes (Addendum)
Nutrition Follow-up:   Patient with stage III endometrial cancer.  Patient receiving carbo/taxol and [place].  Has completed chemotherapy and maybe considering starting letrozole soon.    Spoke with patient via phone for nutrition follow-up.  Patient reports that yesterday was a bad day and she did not eat well (1 biscotti all day). Says that she was in pain due to constipation but was able to have a bowel movement and felt better.  Feels better today.  Does have a UTI and taking antibiotic for that at this time.  Says that she is drinking mostly water, some sprite.  Has some cooked chicken and vegetables that she is going to eat today.  Reports dry mouth.      Medications: reviewed  Labs: reviewed  Anthropometrics:   Weight 318 lb 4.8 oz on 11/29 331 lb on 11/14 341 lb on 10/17   NUTRITION DIAGNOSIS: Inadequate oral intake continues    INTERVENTION:  Encouraged small frequent nibbles/snacks of foods high in protein Encouraged fluid to help with constipation Discussed strategies to help with dry mouth Patient had to leave conversation due to feeling nauseated after taking antibiotic.     MONITORING, EVALUATION, GOAL: weight trends, intake   NEXT VISIT: Tuesday, Jan 9 phone call  Sahmir Weatherbee B. Zenia Resides, [place], [place] Registered Dietitian [phone]

## 2022-05-25 ENCOUNTER — Other Ambulatory Visit: Payer: Medicaid Other

## 2022-05-25 DIAGNOSIS — Z515 Encounter for palliative care: Secondary | ICD-10-CM

## 2022-05-25 NOTE — Progress Notes (Signed)
COMMUNITY PALLIATIVE CARE SW NOTE  PATIENT NAME: Kathryn Maddox DOB: 10-Aug-1950 MRN: [id number]  PRIMARY CARE PROVIDER: Sofie Hartigan, MD  RESPONSIBLE PARTY:  Acct ID - Guarantor Home Phone Work Phone Relationship Acct Type  [id number] Kathryn [phone]  Self P/F     [address], [place], [place] [address]     PLAN OF CARE and INTERVENTIONS:             GOALS OF CARE/ ADVANCE CARE PLANNING:    Goals include to maximize quality of life and assist with pain management. Our advance care planning conversation included a discussion about:    The value and importance of advance care planning  Review and updating or creation of an advance directive document.                       Patient is a FULL CODE - MOST form reviewed, and blank form left in home for patient review. Patient has a will and patients friend, Kathryn Maddox, holds [place].  2.                Palliative care encounter: SW and RN completed joint initial in home visit with patient.   Functional changes/updates: patient with endometrial cancer. Patient received her last cancer treatment on 04/26/22. Patient will not pursue any further treatment. Patient shared her side effect symptoms from the treatment to include lack of energy, poor vision, poor appetite and some memory deficits. Patient experiences constant pain mainly in the back. Holistic pain management approaches briefly discussed to include acupuncture, as patient shared that she does not take much pain medication.  Patient ambulates with rollator.      Psychosocial assessment: completed.   In home support: patient resides independently and has support from a friend. Cancer center SW has provided resources. Oncology office has ordered [facility] PT.  Transportation: no needs.  Food: no food insecurities witnessed. Patient shared that she has a poor appetite but does not desire pursuing an appetite stimulant. Patient also not interested in supplements like ensure or  boost, out of fear of possible diarrhea.   Safety and long term planning: patient feels safe in his home and desires to remain in her home. Life alert system discussed.   SW discussed goals, reviewed care plan, provided emotional support, used active and reflective listening in the form of reciprocity emotional response. Questions and concerns were addressed. The patient/family was encouraged to call with any additional questions and/or concerns. PC Provided general support and encouragement, no other unmet needs identified. Will continue to follow.   3.         PATIENT/CAREGIVER EDUCATION/ COPING:   Appearance: well groomed, appropriate given situation  Mental Status: Alert/oriented. Eye Contact: Good. Able to engage in proper eye contact  Thought Process: rational  Thought Content: not assessed  Speech: normal  Mood: Normal and calm Affect: Congruent to endorsed mood, full ranging Insight: normal Judgement: normal  Interaction Style: Cooperative   Patient A&O, patient engaged in fluent conversation and answered all questions appropriately.    4.         PERSONAL EMERGENCY PLAN:  Patient will call 9-1-1 for emergencies.    5.         COMMUNITY RESOURCES COORDINATION/ HEALTH CARE NAVIGATION:  patient manages her care.    6.      FINANCIAL CONCERNS/NEEDS: None. Patient does not qualify for community Medicaid.  Primary Health Insurance:  HTA Medicare Secondary Health Insurance: Medicaid Prescription Coverage: Yes, no history of difficulty obtaining or affording prescriptions reported.     SOCIAL HX:  Social History   Tobacco Use   Smoking status: Never    Passive exposure: Never   Smokeless tobacco: Never  Substance Use Topics   Alcohol use: Yes    Comment: rarely    CODE STATUS: FULL CODE  ADVANCED DIRECTIVES: Y MOST FORM COMPLETE: DISCUSSED/REVIEWED HOSPICE EDUCATION PROVIDED: N  PPS: patient is independent with ADL's and A&O x4 to make needs  known.   Time spent: 50 min      [place], [place]

## 2022-05-26 NOTE — Progress Notes (Signed)
PATIENT NAME: Kathryn Maddox DOB: September 22, 1950 MRN: [id number]  PRIMARY CARE PROVIDER: Sofie Hartigan, MD  RESPONSIBLE PARTY:  Acct ID - Guarantor Home Phone Work Phone Relationship Acct Type  [id number] Delrae [phone]  Self P/F     [address], [place], [place] [address]   Initial PC visit completed with patient and [name], SW.  ACP:  Discussed code status and reviewed MOST form.  Left blank form and [place] booklet with patient.  Will discuss further on next visit. Patient states she is not afraid to die and she is aware this dx is not curable.  Patient already has a living will in place.  She advises she is updating a personal will this week.  Endometrial Cancer:  Patient received 4 cycles of carbo/Taxol/Keytruda, last cycle completed on 04/26/22.  Patient states she remains very weak despite being 1 month post treatment.  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 She reports weakness, loss of appetite, insomnia, visual changes and memory deficits since chemo started.  She does not wish to pursue any further chemo.  Patient will follow up with oncology in January to further discuss letrozole.  Pain:  Ongoing lower back radiating pain and neuropathy.  Patient states her goal would be to improve her pain level.  We discussed current regimen of acetaminophen, motrin and MS Contin.  Patient states she does not like to take a lot of medications due to side effects.  Opioids cause severe constipation for patient.  She recently took miralax and 2 suppositories to address constipation.  Began having diarrhea after regimen but no issues so far today.   MS Contin 1 tab was taken earlier this week.  Patient states she slept well during the night but was "out of it and very groggy" the next day.  Her neighbor who is also a nurse was concerned about overdose per patient.  We discussed holistic management of acupuncture and patient is open to trying this  and will pursue referral at [place] office.  Patient advised gabapentin and oxycodone were previously tried and not beneficial.  Will follow up with oncology to see if there are other options.  Resources:  Patient lives alone but has a neighbor who is a Marine scientist that checks in with her.  She has another friend who assists with household chores and will pick items if needed.  Discussed life alert given patient lives alone.  She will follow up with insurance.  Home Health referral still pending.  Patient has not heard from anyone yet.  Looks like referral was sent out on 05/18/22.  Discussed recent shortages reported within home health agencies and it may take a few days for referral to be accepted.   CODE STATUS: Full ADVANCED DIRECTIVES: Yes-not on file MOST FORM: No PPS: 50%   PHYSICAL EXAM:   LUNGS: clear to auscultation  CARDIAC: Cor RRR}  EXTREMITIES: 1+ pedal edema bilaterally SKIN: no open areas reported by patient.  Pale in appearance.  NEURO: positive for gait problems, memory problems, and weakness       Lorenza Burton, RN

## 2022-05-31 ENCOUNTER — Telehealth: Payer: Self-pay | Admitting: *Deleted

## 2022-05-31 NOTE — Telephone Encounter (Signed)
Patient agrees to 930 tomorrow appointment

## 2022-05-31 NOTE — Telephone Encounter (Signed)
Patient called reporting that she is feeling very weak and is asking what can be done for her. Please advise

## 2022-06-01 ENCOUNTER — Other Ambulatory Visit: Payer: Self-pay

## 2022-06-01 ENCOUNTER — Inpatient Hospital Stay: Payer: HMO

## 2022-06-01 ENCOUNTER — Encounter: Payer: Self-pay | Admitting: Hospice and Palliative Medicine

## 2022-06-01 ENCOUNTER — Inpatient Hospital Stay (HOSPITAL_BASED_OUTPATIENT_CLINIC_OR_DEPARTMENT_OTHER): Payer: HMO | Admitting: Hospice and Palliative Medicine

## 2022-06-01 VITALS — BP 129/76 | HR 93 | Temp 98.6°F | Resp 22

## 2022-06-01 DIAGNOSIS — E1142 Type 2 diabetes mellitus with diabetic polyneuropathy: Secondary | ICD-10-CM | POA: Diagnosis not present

## 2022-06-01 DIAGNOSIS — M5432 Sciatica, left side: Secondary | ICD-10-CM

## 2022-06-01 DIAGNOSIS — E860 Dehydration: Secondary | ICD-10-CM

## 2022-06-01 DIAGNOSIS — Z95828 Presence of other vascular implants and grafts: Secondary | ICD-10-CM

## 2022-06-01 DIAGNOSIS — E876 Hypokalemia: Secondary | ICD-10-CM | POA: Diagnosis not present

## 2022-06-01 DIAGNOSIS — I10 Essential (primary) hypertension: Secondary | ICD-10-CM | POA: Diagnosis not present

## 2022-06-01 DIAGNOSIS — G629 Polyneuropathy, unspecified: Secondary | ICD-10-CM | POA: Diagnosis not present

## 2022-06-01 DIAGNOSIS — K5900 Constipation, unspecified: Secondary | ICD-10-CM | POA: Diagnosis not present

## 2022-06-01 DIAGNOSIS — C541 Malignant neoplasm of endometrium: Secondary | ICD-10-CM

## 2022-06-01 DIAGNOSIS — M545 Low back pain, unspecified: Secondary | ICD-10-CM | POA: Diagnosis not present

## 2022-06-01 DIAGNOSIS — R531 Weakness: Secondary | ICD-10-CM

## 2022-06-01 DIAGNOSIS — D509 Iron deficiency anemia, unspecified: Secondary | ICD-10-CM

## 2022-06-01 DIAGNOSIS — R197 Diarrhea, unspecified: Secondary | ICD-10-CM | POA: Diagnosis not present

## 2022-06-01 LAB — COMPREHENSIVE METABOLIC PANEL
ALT: 12 U/L (ref 0–44)
AST: 19 U/L (ref 15–41)
Albumin: 3 g/dL — ABNORMAL LOW (ref 3.5–5.0)
Alkaline Phosphatase: 42 U/L (ref 38–126)
Anion gap: 11 (ref 5–15)
BUN: 13 mg/dL (ref 8–23)
CO2: 25 mmol/L (ref 22–32)
Calcium: 8.5 mg/dL — ABNORMAL LOW (ref 8.9–10.3)
Chloride: 102 mmol/L (ref 98–111)
Creatinine, Ser: 0.61 mg/dL (ref 0.44–1.00)
GFR, Estimated: >60 mL/min (ref >60)
Glucose, Bld: 137 mg/dL — ABNORMAL HIGH (ref 70–99)
Potassium: 3.2 mmol/L — ABNORMAL LOW (ref 3.5–5.1)
Sodium: 138 mmol/L (ref 135–145)
Total Bilirubin: 0.5 mg/dL (ref 0.3–1.2)
Total Protein: 6.6 g/dL (ref 6.5–8.1)

## 2022-06-01 LAB — IRON AND TIBC
Iron: 30 ug/dL (ref 28–170)
Saturation Ratios: 13 % (ref 10.4–31.8)
TIBC: 237 ug/dL — ABNORMAL LOW (ref 250–450)
UIBC: 207 ug/dL

## 2022-06-01 LAB — CBC WITH DIFFERENTIAL/PLATELET
Abs Immature Granulocytes: 0.03 10*3/uL (ref 0.00–0.07)
Basophils Absolute: 0 10*3/uL (ref 0.0–0.1)
Basophils Relative: 1 %
Eosinophils Absolute: 0 10*3/uL (ref 0.0–0.5)
Eosinophils Relative: 1 %
HCT: 27.1 % — ABNORMAL LOW (ref 36.0–46.0)
Hemoglobin: 8.3 g/dL — ABNORMAL LOW (ref 12.0–15.0)
Immature Granulocytes: 1 %
Lymphocytes Relative: 10 %
Lymphs Abs: 0.5 10*3/uL — ABNORMAL LOW (ref 0.7–4.0)
MCH: 26.6 pg (ref 26.0–34.0)
MCHC: 30.6 g/dL (ref 30.0–36.0)
MCV: 86.9 fL (ref 80.0–100.0)
Monocytes Absolute: 0.7 10*3/uL (ref 0.1–1.0)
Monocytes Relative: 15 %
Neutro Abs: 3.5 10*3/uL (ref 1.7–7.7)
Neutrophils Relative %: 72 %
Platelets: 364 10*3/uL (ref 150–400)
RBC: 3.12 MIL/uL — ABNORMAL LOW (ref 3.87–5.11)
RDW: 18.1 % — ABNORMAL HIGH (ref 11.5–15.5)
WBC: 4.8 10*3/uL (ref 4.0–10.5)
nRBC: 0 % (ref 0.0–0.2)

## 2022-06-01 LAB — FERRITIN: Ferritin: 391 ng/mL — ABNORMAL HIGH (ref 11–307)

## 2022-06-01 LAB — MAGNESIUM: Magnesium: 1.6 mg/dL — ABNORMAL LOW (ref 1.7–2.4)

## 2022-06-01 MED ORDER — MAGNESIUM SULFATE 2 GM/50ML IV SOLN
2.0000 g | Freq: Once | INTRAVENOUS | Status: AC
  Administered 2022-06-01: 2 g via INTRAVENOUS
  Filled 2022-06-01: qty 50

## 2022-06-01 MED ORDER — POTASSIUM CHLORIDE 20 MEQ/100ML IV SOLN
20.0000 meq | Freq: Once | INTRAVENOUS | Status: AC
  Administered 2022-06-01: 20 meq via INTRAVENOUS

## 2022-06-01 MED ORDER — HEPARIN SOD (PORK) LOCK FLUSH 100 UNIT/ML IV SOLN
500.0000 [IU] | Freq: Once | INTRAVENOUS | Status: AC
  Administered 2022-06-01: 500 [IU]
  Filled 2022-06-01: qty 5

## 2022-06-01 MED ORDER — SODIUM CHLORIDE 0.9% FLUSH
10.0000 mL | Freq: Once | INTRAVENOUS | Status: AC
  Administered 2022-06-01: 10 mL via INTRAVENOUS
  Filled 2022-06-01: qty 10

## 2022-06-01 MED ORDER — ACETAMINOPHEN 325 MG PO TABS
650.0000 mg | ORAL_TABLET | Freq: Once | ORAL | Status: AC
  Administered 2022-06-01: 650 mg via ORAL
  Filled 2022-06-01: qty 2

## 2022-06-01 MED ORDER — POTASSIUM CHLORIDE CRYS ER 10 MEQ PO TBCR: 10.0000 meq | EXTENDED_RELEASE_TABLET | Freq: Every day | ORAL | 0 refills | Status: DC

## 2022-06-01 MED ORDER — SODIUM CHLORIDE 0.9 % IV SOLN
INTRAVENOUS | Status: DC
  Filled 2022-06-01 (×2): qty 250

## 2022-06-01 MED ORDER — MAGNESIUM CHLORIDE 64 MG PO TBEC: 1.0000 | DELAYED_RELEASE_TABLET | Freq: Every day | ORAL | 0 refills | Status: DC

## 2022-06-01 NOTE — Progress Notes (Signed)
[facility] [place] [facility] [phone] [phone]  Patient Care Team: Sofie Hartigan, MD as PCP - General (Family Medicine) Clent Jacks, RN as Oncology Nurse Navigator [place], [place], MD as Referring Physician (Gynecologic Oncology) Noreene Filbert, MD as Consulting Physician (Radiation Oncology) Shirline Frees, MD as Referring Physician (Radiation Oncology) Lonia Farber, MD as Consulting Physician (Endocrinology)   NAME OF PATIENT: Kathryn Maddox  [id number]  September 04, 1950   DATE OF VISIT: 06/01/22  REASON FOR CONSULT: Kathryn Maddox is a 71 y.o. female with multiple medical problems including morbid obesity, hypertension, hyperlipidemia, stage III endometrial cancer status post EBRT and vaginal brachytherapy now on treatment with chemotherapy/immunotherapy.   INTERVAL HISTORY: Patient received cycle 4 carbo/Taxol/Keytruda on 04/26/2022.  She subsequently had severe fatigue and worsening neuropathy. She had follow-up with Dr. Darrall Dears on 05/18/2022 with patient opting not to proceed with further systemic chemotherapy and wanted a few weeks prior to trial of letrozole.  Patient could not tolerate gabapentin or Cymbalta due to GI upset.  She was referred to acupuncture.  Patient was also referred to community palliative care.  Patient presents [facility] today for evaluation of ongoing fatigue.  She states that fatigue has not improved even after stopping chemotherapy.  She continues to live at home and is able to engage in her ADLs but just does not have much energy or desire to engage in activities outside the home.  She continues to have low back pain and intermittent constipation/diarrhea.  Appetite may be slightly improved.  She denies fever or chills or any symptomatic complaints.  No nausea or vomiting.  No fever or chills.  No abdominal pain.  Denies urinary symptoms.  No cough or congestion or  shortness of breath or chest pain. Patient offers no further specific complaints today.   PAST MEDICAL HISTORY: Past Medical History:  Diagnosis Date   Carpal tunnel syndrome on both sides 03/2018   Cellulitis and abscess of lower extremity 03/2018   bilateral lower extrems, treated with antibx x 10 days   Diabetes mellitus without complication ([place])    Dyspnea 03/2018   with any exercise, due to weight   Eczema    Endometrial cancer ([place]) 08/26/2020   Hyperlipidemia    Hypertension    Morbid obesity with BMI of 70 and over, adult ([place]) 03/2018   Sleep apnea 03/2018   uses a cpap    PAST SURGICAL HISTORY:  Past Surgical History:  Procedure Laterality Date   CARPAL TUNNEL RELEASE Left 04/12/2018   Procedure: CARPAL TUNNEL RELEASE;  Surgeon: Hessie Knows, MD;  Location: [facility] ORS;  Service: Orthopedics;  Laterality: Left;   COLONOSCOPY WITH PROPOFOL N/A 12/02/2019   Procedure: COLONOSCOPY WITH PROPOFOL;  Surgeon: Toledo, Benay Pike, MD;  Location: [facility] ENDOSCOPY;  Service: Gastroenterology;  Laterality: N/A;   DILATION AND CURETTAGE OF UTERUS  03/2018   has had 10 d & c's with hysteroscopy   HERNIA REPAIR  3790   umbilical w/ obstructed bowel   IR IMAGING GUIDED PORT INSERTION  02/17/2022   LIPOMA EXCISION Right 1997   shoulder   obstructive bowel surgery  01/2010    HEMATOLOGY/ONCOLOGY HISTORY:  Oncology History  Endometrial cancer ([place])  07/30/2020 Initial Diagnosis   She was seen by Dr. Leonides Schanz of OBGYN for post menopausal bleeding x 7-8 years.    08/14/2020 Imaging   Pelvic US showed  FINDINGS: Uterus: Measurements: 7.7 x 5.3 x 5.8 cm = volume: 123  mL. No fibroids or other mass visualized. Endometrium: Thickness: 10 mm.  No focal abnormality visualized. Right ovary: Nonvisualized. Left ovary: Nonvisualized. Other findings: No abnormal free fluid.   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 No or  minimal myometrial invasion, less than 50%, suggesting stage 1a disease by imaging.  -She was not a surgical candidate due to morbid obesity.  -S/p IUD Mirena by Dr. Leafy Ro 4/22 - Repeat EMBx on 03/24/21 showed persistent grade 1 endometrioid cancer.  - Unable to tolerate Norethidrone and IUD expelled.       Radiation Therapy   - S/p EBRT at [place] followed by 4 fractions of vaginal brachytherapy at [place] completed on 9/62/8366.   - complicated by radiation cystitis and diarrhea. Was seen by Dr. Delaine Lame of ID. Diagnosed with VRE colonization. Symptoms have largely improved since.     01/19/2022 Progression   PET scan performed at [facility]-  IMPRESSION:  1.  Focal area of markedly increased FDG activity in the fundus of the uterus which may represent uterine cancer  2.  Subcentimeter right pelvic and presacral lymph nodes with intense FDG activity concerning for nodal metastases.    02/24/2022 -  Chemotherapy   Patient is on Treatment Plan :  Dose reduced.   Carboplatin AUC 4 + Paclitaxel 135 mg/m2 q21d due to functional status and pre-existing peripheral neuropathy.        ALLERGIES:  is allergic to megace [megestrol], penicillins, ibuprofen, and pravastatin.  MEDICATIONS:  Current Outpatient Medications  Medication Sig Dispense Refill   dexamethasone (DECADRON) 4 MG tablet Take 2 tablets (8 mg total) by mouth daily for 3 days. Start the day after chemotherapy. Take with food. (Patient not taking: Reported on 05/18/2022) 30 tablet 1   Easy Touch Lancets 30G/Twist MISC  (Patient not taking: Reported on 05/18/2022)     ergocalciferol (VITAMIN D2) 1.25 MG (50000 UT) capsule Take 1 capsule (50,000 Units total) by mouth once a week. (Patient not taking: Reported on 05/18/2022) 30 capsule 3   lidocaine-prilocaine (EMLA) cream Apply 1 Application topically as needed. Apply to port 1 hour prior to chemotherapy. Cover with plastic wrap. 30 g 2   losartan (COZAAR) 50 MG tablet Take 50 mg by  mouth daily.     ondansetron (ZOFRAN) 8 MG tablet Take 1 tablet (8 mg total) by mouth every 8 (eight) hours as needed for nausea or vomiting. Start on the third day after chemotherapy. (Patient not taking: Reported on 05/04/2022) 30 tablet 1   prochlorperazine (COMPAZINE) 10 MG tablet Take 1 tablet (10 mg total) by mouth every 6 (six) hours as needed for nausea or vomiting. (Patient not taking: Reported on 03/15/2022) 30 tablet 1   tirzepatide (MOUNJARO) 5 MG/0.5ML Pen INJECT 5 MG UNDER THE SKIN EVERY 7 DAYS     No current facility-administered medications for this visit.    VITAL SIGNS: There were no vitals taken for this visit. There were no vitals filed for this visit.  Estimated body mass index is 60.14 kg/m as calculated from the following:   Height as of 02/28/22: '5\' 1"'$  (1.549 m).   Weight as of 05/18/22: 318 lb 4.8 oz (144.4 kg).  LABS: CBC:    Component Value Date/Time   WBC 1.9 (L) 05/04/2022 1119   HGB 9.2 (L) 05/04/2022 1119   HCT 27.9 (L) 05/04/2022 1119   HCT 28.1 (L) 05/04/2022 1119   PLT 178 05/04/2022 1119  MCV 85.2 05/04/2022 1119   NEUTROABS 1.1 (L) 05/04/2022 1119   LYMPHSABS 0.5 (L) 05/04/2022 1119   MONOABS 0.2 05/04/2022 1119   EOSABS 0.0 05/04/2022 1119   BASOSABS 0.0 05/04/2022 1119   Comprehensive Metabolic Panel:    Component Value Date/Time   NA 133 (L) 05/04/2022 1134   K 3.6 05/04/2022 1134   CL 99 05/04/2022 1134   CO2 25 05/04/2022 1134   BUN 13 05/04/2022 1134   CREATININE 0.67 05/04/2022 1134   GLUCOSE 135 (H) 05/04/2022 1134   CALCIUM 9.4 05/04/2022 1134   AST 25 05/04/2022 1134   ALT 27 05/04/2022 1134   ALKPHOS 45 05/04/2022 1134   BILITOT 0.6 05/04/2022 1134   PROT 7.0 05/04/2022 1134   ALBUMIN 3.6 05/04/2022 1134    RADIOGRAPHIC STUDIES: No results found.  PERFORMANCE STATUS (ECOG) : 3 - Symptomatic, >50% confined to bed  Review of Systems Unless otherwise noted, a complete review of systems is negative.  Physical  Exam General: NAD Cardiovascular: regular rate and rhythm Pulmonary: clear ant fields Abdomen: soft, nontender, + bowel sounds GU: no suprapubic tenderness Extremities: no edema, no joint deformities Skin: no rashes Neurological: Weakness but otherwise nonfocal  IMPRESSION/PLAN: Fatigue/weakness -likely multifactorial with limited reserve given cancer/previous treatment.  Labs suggestive of slightly worse anemia.  Will add on iron panel today. ?referral to GI. We have previously referred for home health PT.  Patient says that she is not interested in prescription medications for management of symptoms.  Suggested that she could try American ginseng.  Depressive symptoms -patient admits to frequently crying but says that she does not feel like she is depressed.  I do wonder if this is contributing to her perception of fatigue.  She declined my offer to try an antidepressant.  She said that she would prefer not to use prescription medications to manage symptoms.  Will send referral to psychologist.   Neuropathy/Pain - she is not interested in prescription pain meds. She takes acetaminophen as needed for pain.  She has reached out to acupuncturist but needs to coordinate transportation.  Hypokalemia/hypomagnesemia -replete with IV K and mag. Start oral supplementation.   Dehydration - IV fluids today  RTC next week for labs/fluids   Patient expressed understanding and was in agreement with this plan. She also understands that She can call clinic at any time with any questions, concerns, or complaints.   Thank you for allowing me to participate in the care of this very pleasant patient.   Time Total: 20 minutes  Visit consisted of counseling and education dealing with the complex and emotionally intense issues of symptom management in the setting of serious illness.Greater than 50%  of this time was spent counseling and coordinating care related to the above assessment and plan.  Signed  by: Altha Harm, PhD, NP-C

## 2022-06-03 ENCOUNTER — Encounter: Payer: Self-pay | Admitting: *Deleted

## 2022-06-07 ENCOUNTER — Other Ambulatory Visit: Payer: Medicaid Other

## 2022-06-07 VITALS — HR 93 | Temp 98.0°F

## 2022-06-07 DIAGNOSIS — Z515 Encounter for palliative care: Secondary | ICD-10-CM

## 2022-06-07 NOTE — Progress Notes (Addendum)
PATIENT NAME: Kathryn Maddox DOB: 30-Oct-1950 MRN: [id number]  PRIMARY CARE PROVIDER: Sofie Hartigan, MD  RESPONSIBLE PARTY:  Acct ID - Guarantor Home Phone Work Phone Relationship Acct Type  [id number] Kathryn Maddox, [phone]  Self P/F     [address] [place], [place] [address]   ACP:  Discussion completed on code status and patient desires DNR.  Message sent to Kathryn Chang, NP regarding code status change and to have form completed on tomorrow's visit to the cancer center.   Patient endorses 5 wishes were completed.  She has reviewed the MOST form but has not completed at this time.  Patient shared her feelings about her current situation and how angry she is.  She does not want to seek additional chemo treatment and is uncertain about letrozole.  Her goal is to improve her quality of life not so much quantity of life.   We discussed upcoming appointment in January with oncology to further discuss letrozole treatment risks vs benefits.   Constipation/Diarrhea:  Ongoing.  Took Miralax and rexol on Sunday and was able to have a bowel movement.  Was able to have a bowel movement today without problems.   Depression:  Patient reports crying and anger spells due to her medical situation.  She does not wish to be started on an anti-depressant and be seen by therapy.  Weakness:  Ongoing.  Was seen last week at the cancer center to have blood work completed and infusion.  She will return tomorrow for blood work and possible infusion.    CODE STATUS: Full-desires DNR ADVANCED DIRECTIVES: Kathryn Maddox holds HCPOA MOST FORM: No PPS: 50%   PHYSICAL EXAM:   VITALS: Today's Vitals   06/07/22 1319  Pulse: 93  Temp: 98 F (36.7 C)  SpO2: 97%    LUNGS: clear to auscultation  CARDIAC: Cor RRR}  EXTREMITIES: - for edema SKIN: Skin color, texture, turgor normal. No rashes or lesions or normal  NEURO: positive for weakness       Kathryn Burton, RN

## 2022-06-07 NOTE — Progress Notes (Signed)
COMMUNITY PALLIATIVE CARE SW NOTE  PATIENT NAME: Kathryn Maddox DOB: 1951-02-23 MRN: [id number]  PRIMARY CARE PROVIDER: Sofie Hartigan, MD  RESPONSIBLE PARTY:  Acct ID - Guarantor Home Phone Work Phone Relationship Acct Type  [id number] ONICA, [phone]  Self P/F     [address], [place], [place] [address]     PLAN OF CARE and INTERVENTIONS:             GOALS OF CARE/ ADVANCE CARE PLANNING:    Goals include to maximize quality of life and assist with pain management. Our advance care planning conversation included a discussion about:    The value and importance of advance care planning  Review and updating or creation of an advance directive document.                       Patient is a FULL CODE (PATIENT WISH TO BE DNR) - MOST form reviewed, and blank form left in home for patient review. Patient has a will and patients friend, [name], holds [place].   2.                Palliative care encounter: SW and RN completed joint initial in home visit with patient.   Functional changes/updates: Patient continues to experience constant pain mainly in the back as well as from neuropathy. Holistic pain management approaches briefly discussed to include acupuncture, as patient shared that she does not take much pain medication. Patient shared that acupuncture is not an option as she can not get to the sessions nor will she be able to get on to the tab;e to receive the service. Patient states the pain keeps her from sleeping.   Breathing: patient shared that she is now experiencing shortness of breath with ambulation.  Patient ambulates with rollator.      Psychosocial assessment: completed.    In home support: patient resides independently and has support from a friend. Cancer center SW has provided resources. Oncology office has ordered [facility] declined referral d/t staffing.  Transportation: no needs.   Food: no food insecurities witnessed. Patient shared that she  has a poor appetite but does not desire pursuing an appetite stimulant. Patient also not interested in supplements like ensure or boost, out of fear of possible diarrhea.    Safety and long term planning: patient feels safe in his home and desires to remain in her home. Life alert system discussed.   SW discussed goals, reviewed care plan, provided emotional support, used active and reflective listening in the form of reciprocity emotional response. Questions and concerns were addressed. The patient/family was encouraged to call with any additional questions and/or concerns. PC Provided general support and encouragement, no other unmet needs identified. Will continue to follow.   3.         PATIENT/CAREGIVER EDUCATION/ COPING:   Appearance: well groomed, appropriate given situation  Mental Status: Alert/oriented. Eye Contact: Good. Able to engage in proper eye contact  Thought Process: rational  Thought Content: not assessed  Speech: normal  Mood: Normal and calm Affect: Congruent to endorsed mood, full ranging Insight: normal Judgement: normal  Interaction Style: Cooperative   Patient A&O, patient engaged in fluent conversation and answered all questions appropriately. Patient is having difficulty with emotions around her cancer dx. Patient expressed that she is mostly angry versus sad due to the cancer treatments leaving her with so many adverse symptoms. SW utilized active and reflective listening during visit.  4.         PERSONAL EMERGENCY PLAN:  Patient will call 9-1-1 for emergencies.    5.         COMMUNITY RESOURCES COORDINATION/ HEALTH CARE NAVIGATION:  patient manages her care.    6.      FINANCIAL CONCERNS/NEEDS: None. Patient does not qualify for community Medicaid.                         Primary Health Insurance:  HTA Medicare Secondary Health Insurance: Medicaid Prescription Coverage: Yes, no history of difficulty obtaining or affording prescriptions reported.      SOCIAL HX:  Social History   Tobacco Use   Smoking status: Never    Passive exposure: Never   Smokeless tobacco: Never  Substance Use Topics   Alcohol use: Yes    Comment: rarely    CODE STATUS: FULL CODE  ADVANCED DIRECTIVES: Y MOST FORM COMPLETE: DISCUSSED HOSPICE EDUCATION PROVIDED: N  PPS: patient is independent with ADL's      [name], LCSW

## 2022-06-08 ENCOUNTER — Inpatient Hospital Stay: Payer: HMO

## 2022-06-08 ENCOUNTER — Encounter: Payer: Self-pay | Admitting: Internal Medicine

## 2022-06-08 ENCOUNTER — Inpatient Hospital Stay (HOSPITAL_BASED_OUTPATIENT_CLINIC_OR_DEPARTMENT_OTHER): Payer: HMO | Admitting: Hospice and Palliative Medicine

## 2022-06-08 DIAGNOSIS — C541 Malignant neoplasm of endometrium: Secondary | ICD-10-CM

## 2022-06-08 DIAGNOSIS — E876 Hypokalemia: Secondary | ICD-10-CM

## 2022-06-08 DIAGNOSIS — R531 Weakness: Secondary | ICD-10-CM

## 2022-06-08 LAB — COMPREHENSIVE METABOLIC PANEL
ALT: 11 U/L (ref 0–44)
AST: 20 U/L (ref 15–41)
Albumin: 3.2 g/dL — ABNORMAL LOW (ref 3.5–5.0)
Alkaline Phosphatase: 38 U/L (ref 38–126)
Anion gap: 10 (ref 5–15)
BUN: 9 mg/dL (ref 8–23)
CO2: 25 mmol/L (ref 22–32)
Calcium: 8.7 mg/dL — ABNORMAL LOW (ref 8.9–10.3)
Chloride: 103 mmol/L (ref 98–111)
Creatinine, Ser: 0.58 mg/dL (ref 0.44–1.00)
GFR, Estimated: >60 mL/min (ref >60)
Glucose, Bld: 112 mg/dL — ABNORMAL HIGH (ref 70–99)
Potassium: 3.9 mmol/L (ref 3.5–5.1)
Sodium: 138 mmol/L (ref 135–145)
Total Bilirubin: 0.6 mg/dL (ref 0.3–1.2)
Total Protein: 6.8 g/dL (ref 6.5–8.1)

## 2022-06-08 LAB — CBC WITH DIFFERENTIAL/PLATELET
Abs Immature Granulocytes: 0.03 10*3/uL (ref 0.00–0.07)
Basophils Absolute: 0 10*3/uL (ref 0.0–0.1)
Basophils Relative: 0 %
Eosinophils Absolute: 0 10*3/uL (ref 0.0–0.5)
Eosinophils Relative: 1 %
HCT: 26.6 % — ABNORMAL LOW (ref 36.0–46.0)
Hemoglobin: 8.5 g/dL — ABNORMAL LOW (ref 12.0–15.0)
Immature Granulocytes: 1 %
Lymphocytes Relative: 12 %
Lymphs Abs: 0.7 10*3/uL (ref 0.7–4.0)
MCH: 27.4 pg (ref 26.0–34.0)
MCHC: 32 g/dL (ref 30.0–36.0)
MCV: 85.8 fL (ref 80.0–100.0)
Monocytes Absolute: 0.8 10*3/uL (ref 0.1–1.0)
Monocytes Relative: 15 %
Neutro Abs: 3.8 10*3/uL (ref 1.7–7.7)
Neutrophils Relative %: 71 %
Platelets: 349 10*3/uL (ref 150–400)
RBC: 3.1 MIL/uL — ABNORMAL LOW (ref 3.87–5.11)
RDW: 18.1 % — ABNORMAL HIGH (ref 11.5–15.5)
WBC: 5.4 10*3/uL (ref 4.0–10.5)
nRBC: 0 % (ref 0.0–0.2)

## 2022-06-08 LAB — MAGNESIUM: Magnesium: 1.7 mg/dL (ref 1.7–2.4)

## 2022-06-08 MED ORDER — SODIUM CHLORIDE 0.9% FLUSH
10.0000 mL | Freq: Once | INTRAVENOUS | Status: AC
  Administered 2022-06-08: 10 mL via INTRAVENOUS
  Filled 2022-06-08: qty 10

## 2022-06-08 MED ORDER — HEPARIN SOD (PORK) LOCK FLUSH 100 UNIT/ML IV SOLN
500.0000 [IU] | Freq: Once | INTRAVENOUS | Status: AC
  Administered 2022-06-08: 500 [IU] via INTRAVENOUS
  Filled 2022-06-08: qty 5

## 2022-06-08 MED ORDER — SODIUM CHLORIDE 0.9 % IV SOLN
INTRAVENOUS | Status: DC
  Filled 2022-06-08 (×2): qty 250

## 2022-06-08 NOTE — Progress Notes (Signed)
[place] at [facility] Telephone:(336) [phone] Fax:(336) [phone]   Name: Kathryn Maddox Date: 06/08/2022 MRN: [id number]  DOB: [birth year]  Patient Care Team: Sofie Hartigan, MD as PCP - General (Family Medicine) Clent Jacks, RN as Oncology Nurse Navigator [place], [place], MD as Referring Physician (Gynecologic Oncology) Noreene Filbert, MD as Consulting Physician (Radiation Oncology) Shirline Frees, MD as Referring Physician (Radiation Oncology) Lonia Farber, MD as Consulting Physician (Endocrinology)    REASON FOR CONSULTATION: Kathryn Maddox is a 71 y.o. female with multiple medical problems including morbid obesity, hypertension, hyperlipidemia, stage III endometrial cancer status post EBRT and vaginal brachytherapy.  Patient last received cycle 4 carbo/Taxol/Keytruda on 04/26/2022.  She had poor tolerance of chemotherapy with profound fatigue/weakness.  Palliative care was consulted to help address goals.  SOCIAL HISTORY:     reports that she has never smoked. She has never been exposed to tobacco smoke. She has never used smokeless tobacco. She reports current alcohol use. She reports that she does not use drugs.  Patient is widowed.  She has no children she lives at home alone.  She is originally from [place], [place] and moved here for warmer weather.  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  ADVANCE DIRECTIVES:  Not on file  CODE STATUS: DNR (DNR form signed on 06/08/22)  PAST MEDICAL HISTORY: Past Medical History:  Diagnosis Date   Carpal tunnel syndrome on both sides 03/2018   Cellulitis and abscess of lower extremity 03/2018   bilateral lower extrems, treated with antibx x 10 days   Diabetes mellitus without complication ([place])    Dyspnea 03/2018   with any exercise, due to weight   Eczema    Endometrial cancer ([place]) 08/26/2020    Hyperlipidemia    Hypertension    Morbid obesity with BMI of 70 and over, adult ([place]) 03/2018   Sleep apnea 03/2018   uses a cpap    PAST SURGICAL HISTORY:  Past Surgical History:  Procedure Laterality Date   CARPAL TUNNEL RELEASE Left 04/12/2018   Procedure: CARPAL TUNNEL RELEASE;  Surgeon: Hessie Knows, MD;  Location: [facility] ORS;  Service: Orthopedics;  Laterality: Left;   COLONOSCOPY WITH PROPOFOL N/A 12/02/2019   Procedure: COLONOSCOPY WITH PROPOFOL;  Surgeon: Toledo, Benay Pike, MD;  Location: [facility] END[facility]COPY;  Service: Gastroenterology;  Laterality: N/A;   DILATION AND CURETTAGE OF UTERUS  03/2018   has had 10 d & c's with hysteroscopy   HERNIA REPAIR  1025   umbilical w/ obstructed bowel   IR IMAGING GUIDED PORT INSERTION  02/17/2022   LIPOMA EXCISION Right 1997   shoulder   obstructive bowel surgery  01/2010    HEMATOLOGY/ONCOLOGY HISTORY:  Oncology History  Endometrial cancer ([place])  07/30/2020 Initial Diagnosis   She was seen by Dr. Leonides Schanz of OBGYN for post menopausal bleeding x 7-8 years.    08/14/2020 Imaging   Pelvic US showed  FINDINGS: Uterus: Measurements: 7.7 x 5.3 x 5.8 cm = volume: 123 mL. No fibroids or other mass visualized. Endometrium: Thickness: 10 mm.  No focal abnormality visualized. Right ovary: Nonvisualized. Left ovary: Nonvisualized. Other findings: No abnormal free fluid.   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 No or minimal myometrial invasion, less than 50%, suggesting stage 1a disease by imaging.  -She was not a surgical candidate due to morbid  obesity.  -S/p IUD Mirena by Dr. Leafy Ro 4/22 - Repeat EMBx on 03/24/21 showed persistent grade 1 endometrioid cancer.  - Unable to tolerate Norethidrone and IUD expelled.       Radiation Therapy   - S/p EBRT at [facility] followed by 4 fractions of vaginal brachytherapy at [place] completed on 1/88/4166.   - complicated by  radiation cystitis and diarrhea. Was seen by Dr. Delaine Lame of ID. Diagnosed with VRE colonization. Symptoms have largely improved since.     01/19/2022 Progression   PET scan performed at [facility]-  IMPRESSION:  1.  Focal area of markedly increased FDG activity in the fundus of the uterus which may represent uterine cancer  2.  Subcentimeter right pelvic and presacral lymph nodes with intense FDG activity concerning for nodal metastases.    02/24/2022 -  Chemotherapy   Patient is on Treatment Plan :  Dose reduced.   Carboplatin AUC 4 + Paclitaxel 135 mg/m2 q21d due to functional status and pre-existing peripheral neuropathy.        ALLERGIES:  is allergic to megace [megestrol], penicillins, ibuprofen, and pravastatin.  MEDICATIONS:  Current Outpatient Medications  Medication Sig Dispense Refill   dexamethasone (DECADRON) 4 MG tablet Take 2 tablets (8 mg total) by mouth daily for 3 days. Start the day after chemotherapy. Take with food. (Patient not taking: Reported on 05/18/2022) 30 tablet 1   Easy Touch Lancets 30G/Twist MISC  (Patient not taking: Reported on 05/18/2022)     ergocalciferol (VITAMIN D2) 1.25 MG (50000 UT) capsule Take 1 capsule (50,000 Units total) by mouth once a week. (Patient not taking: Reported on 05/18/2022) 30 capsule 3   lidocaine-prilocaine (EMLA) cream Apply 1 Application topically as needed. Apply to port 1 hour prior to chemotherapy. Cover with plastic wrap. 30 g 2   losartan (COZAAR) 50 MG tablet Take 50 mg by mouth daily.     magnesium chloride (SLOW-MAG) 64 MG TBEC SR tablet Take 1 tablet (64 mg total) by mouth daily. 15 tablet 0   ondansetron (ZOFRAN) 8 MG tablet Take 1 tablet (8 mg total) by mouth every 8 (eight) hours as needed for nausea or vomiting. Start on the third day after chemotherapy. (Patient not taking: Reported on 05/04/2022) 30 tablet 1   potassium chloride (KLOR-CON M) 10 MEQ tablet Take 1 tablet (10 mEq total) by mouth daily. 14 tablet 0    prochlorperazine (COMPAZINE) 10 MG tablet Take 1 tablet (10 mg total) by mouth every 6 (six) hours as needed for nausea or vomiting. (Patient not taking: Reported on 03/15/2022) 30 tablet 1   tirzepatide (MOUNJARO) 5 MG/0.5ML Pen INJECT 5 MG UNDER THE SKIN EVERY 7 DAYS     No current facility-administered medications for this visit.   Facility-Administered Medications Ordered in Other Visits  Medication Dose Route Frequency Provider Last Rate Last Admin   0.9 %  sodium chloride infusion   Intravenous Continuous Ganon Demasi, Kirt Boys, NP 500 mL/hr at 06/08/22 1404 New Bag at 06/08/22 1404   heparin lock flush 100 unit/mL  500 Units Intravenous Once [name], MD        VITAL SIGNS: There were no vitals taken for this visit. There were no vitals filed for this visit.  Estimated body mass index is 60.14 kg/m as calculated from the following:   Height as of 02/28/22: '5\' 1"'$  (1.549 m).   Weight as of 05/18/22: 318 lb 4.8 oz (144.4 kg).  LABS: CBC:    Component Value Date/Time  WBC 5.4 06/08/2022 1339   HGB 8.5 (L) 06/08/2022 1339   HCT 26.6 (L) 06/08/2022 1339   HCT 27.9 (L) 05/04/2022 1119   PLT 349 06/08/2022 1339   MCV 85.8 06/08/2022 1339   NEUTROABS 3.8 06/08/2022 1339   LYMPHSABS 0.7 06/08/2022 1339   MONOABS 0.8 06/08/2022 1339   EOSABS 0.0 06/08/2022 1339   BASOSABS 0.0 06/08/2022 1339   Comprehensive Metabolic Panel:    Component Value Date/Time   NA 138 06/01/2022 0954   K 3.2 (L) 06/01/2022 0954   CL 102 06/01/2022 0954   CO2 25 06/01/2022 0954   BUN 13 06/01/2022 0954   CREATININE 0.61 06/01/2022 0954   GLUCOSE 137 (H) 06/01/2022 0954   CALCIUM 8.5 (L) 06/01/2022 0954   AST 19 06/01/2022 0954   ALT 12 06/01/2022 0954   ALKPHOS 42 06/01/2022 0954   BILITOT 0.5 06/01/2022 0954   PROT 6.6 06/01/2022 0954   ALBUMIN 3.0 (L) 06/01/2022 0954    RADIOGRAPHIC STUDIES: No results found.  PERFORMANCE STATUS (ECOG) : 2 - Symptomatic, <50% confined to bed  Review  of Systems Unless otherwise noted, a complete review of systems is negative.  Physical Exam General: NAD Cardiovascular: regular rate and rhythm Pulmonary: clear ant fields Abdomen: soft, nontender, + bowel sounds GU: no suprapubic tenderness Extremities: no edema, no joint deformities Skin: no rashes Neurological: Weakness but otherwise nonfocal  IMPRESSION: Follow-up visit.  Patient seen in fluid clinic.  Symptomatically, patient reports she is doing about the same.  She remains fatigued/weak.  Unfortunately, patient has not yet received home health services due to staffing issues.  Patient feels like she might be able to physically drive herself to outpatient PT.  Will send referral.  Patient reports insomnia and persistent pain but is not interested in prescription medications at this time.  We discussed CODE STATUS.  Patient states that she would not want to be resuscitated or have her life prolonged artificial machines.  She was in agreement with DNR/DNI.  DNR form signed for patient to come.  PLAN: -Continue current scope of treatment -Referrals to PT -DNR -Follow-up in 1 month   Patient expressed understanding and was in agreement with this plan. She also understands that She can call the clinic at any time with any questions, concerns, or complaints.     Time Total: 15 minutes  Visit consisted of counseling and education dealing with the complex and emotionally intense issues of symptom management and palliative care in the setting of serious and potentially life-threatening illness.Greater than 50%  of this time was spent counseling and coordinating care related to the above assessment and plan.  Signed by: Altha Harm, PhD, NP-C

## 2022-06-16 ENCOUNTER — Other Ambulatory Visit: Payer: Self-pay | Admitting: *Deleted

## 2022-06-16 DIAGNOSIS — C541 Malignant neoplasm of endometrium: Secondary | ICD-10-CM

## 2022-06-16 DIAGNOSIS — E876 Hypokalemia: Secondary | ICD-10-CM

## 2022-06-16 DIAGNOSIS — R531 Weakness: Secondary | ICD-10-CM

## 2022-06-16 DIAGNOSIS — D509 Iron deficiency anemia, unspecified: Secondary | ICD-10-CM

## 2022-06-21 ENCOUNTER — Inpatient Hospital Stay: Payer: PPO | Attending: Obstetrics and Gynecology

## 2022-06-21 ENCOUNTER — Inpatient Hospital Stay (HOSPITAL_BASED_OUTPATIENT_CLINIC_OR_DEPARTMENT_OTHER): Payer: PPO | Admitting: Internal Medicine

## 2022-06-21 ENCOUNTER — Inpatient Hospital Stay (HOSPITAL_BASED_OUTPATIENT_CLINIC_OR_DEPARTMENT_OTHER): Payer: PPO | Admitting: Hospice and Palliative Medicine

## 2022-06-21 ENCOUNTER — Encounter: Payer: Self-pay | Admitting: Internal Medicine

## 2022-06-21 ENCOUNTER — Other Ambulatory Visit: Payer: Self-pay | Admitting: *Deleted

## 2022-06-21 VITALS — BP 146/78 | HR 88 | Temp 97.2°F | Resp 18 | Wt 312.0 lb

## 2022-06-21 DIAGNOSIS — C541 Malignant neoplasm of endometrium: Secondary | ICD-10-CM

## 2022-06-21 DIAGNOSIS — M549 Dorsalgia, unspecified: Secondary | ICD-10-CM | POA: Diagnosis not present

## 2022-06-21 DIAGNOSIS — D509 Iron deficiency anemia, unspecified: Secondary | ICD-10-CM

## 2022-06-21 DIAGNOSIS — Z66 Do not resuscitate: Secondary | ICD-10-CM | POA: Diagnosis not present

## 2022-06-21 DIAGNOSIS — E1142 Type 2 diabetes mellitus with diabetic polyneuropathy: Secondary | ICD-10-CM | POA: Diagnosis not present

## 2022-06-21 DIAGNOSIS — R634 Abnormal weight loss: Secondary | ICD-10-CM | POA: Insufficient documentation

## 2022-06-21 DIAGNOSIS — R531 Weakness: Secondary | ICD-10-CM | POA: Insufficient documentation

## 2022-06-21 DIAGNOSIS — I10 Essential (primary) hypertension: Secondary | ICD-10-CM | POA: Diagnosis not present

## 2022-06-21 DIAGNOSIS — Z515 Encounter for palliative care: Secondary | ICD-10-CM

## 2022-06-21 DIAGNOSIS — E876 Hypokalemia: Secondary | ICD-10-CM | POA: Insufficient documentation

## 2022-06-21 DIAGNOSIS — E785 Hyperlipidemia, unspecified: Secondary | ICD-10-CM | POA: Diagnosis not present

## 2022-06-21 LAB — CBC WITH DIFFERENTIAL/PLATELET
Abs Immature Granulocytes: 0.02 10*3/uL (ref 0.00–0.07)
Basophils Absolute: 0 10*3/uL (ref 0.0–0.1)
Basophils Relative: 0 %
Eosinophils Absolute: 0.1 10*3/uL (ref 0.0–0.5)
Eosinophils Relative: 1 %
HCT: 28.6 % — ABNORMAL LOW (ref 36.0–46.0)
Hemoglobin: 8.9 g/dL — ABNORMAL LOW (ref 12.0–15.0)
Immature Granulocytes: 0 %
Lymphocytes Relative: 13 %
Lymphs Abs: 0.7 10*3/uL (ref 0.7–4.0)
MCH: 27.1 pg (ref 26.0–34.0)
MCHC: 31.1 g/dL (ref 30.0–36.0)
MCV: 87.2 fL (ref 80.0–100.0)
Monocytes Absolute: 0.9 10*3/uL (ref 0.1–1.0)
Monocytes Relative: 17 %
Neutro Abs: 3.5 10*3/uL (ref 1.7–7.7)
Neutrophils Relative %: 69 %
Platelets: 290 10*3/uL (ref 150–400)
RBC: 3.28 MIL/uL — ABNORMAL LOW (ref 3.87–5.11)
RDW: 17.7 % — ABNORMAL HIGH (ref 11.5–15.5)
WBC: 5.1 10*3/uL (ref 4.0–10.5)
nRBC: 0 % (ref 0.0–0.2)

## 2022-06-21 LAB — COMPREHENSIVE METABOLIC PANEL
ALT: 8 U/L (ref 0–44)
AST: 17 U/L (ref 15–41)
Albumin: 3.2 g/dL — ABNORMAL LOW (ref 3.5–5.0)
Alkaline Phosphatase: 41 U/L (ref 38–126)
Anion gap: 10 (ref 5–15)
BUN: 13 mg/dL (ref 8–23)
CO2: 26 mmol/L (ref 22–32)
Calcium: 8.4 mg/dL — ABNORMAL LOW (ref 8.9–10.3)
Chloride: 103 mmol/L (ref 98–111)
Creatinine, Ser: 0.62 mg/dL (ref 0.44–1.00)
GFR, Estimated: >60 mL/min (ref >60)
Glucose, Bld: 136 mg/dL — ABNORMAL HIGH (ref 70–99)
Potassium: 3.8 mmol/L (ref 3.5–5.1)
Sodium: 139 mmol/L (ref 135–145)
Total Bilirubin: 0.6 mg/dL (ref 0.3–1.2)
Total Protein: 7 g/dL (ref 6.5–8.1)

## 2022-06-21 LAB — MAGNESIUM: Magnesium: 1.6 mg/dL — ABNORMAL LOW (ref 1.7–2.4)

## 2022-06-21 NOTE — Telephone Encounter (Signed)
Pharmacy sent RF request on potassium

## 2022-06-21 NOTE — Progress Notes (Signed)
[place] at [facility] Telephone:(336) [phone] Fax:(336) [phone]   Name: Kathryn Maddox Date: 06/21/2022 MRN: [id number]  DOB: [birth year]  Patient Care Team: Sofie Hartigan, MD as PCP - General (Family Medicine) Clent Jacks, RN as Oncology Nurse Navigator [place], [place], MD as Referring Physician (Gynecologic Oncology) Noreene Filbert, MD as Consulting Physician (Radiation Oncology) Shirline Frees, MD as Referring Physician (Radiation Oncology) Lonia Farber, MD as Consulting Physician (Endocrinology)    REASON FOR CONSULTATION: Kathryn Maddox is a 72 y.o. female with multiple medical problems including morbid obesity, hypertension, hyperlipidemia, stage III endometrial cancer status post EBRT and vaginal brachytherapy.  Patient last received cycle 4 carbo/Taxol/Keytruda on 04/26/2022.  She had poor tolerance of chemotherapy with profound fatigue/weakness.  Palliative care was consulted to help address goals.  SOCIAL HISTORY:     reports that she has never smoked. She has never been exposed to tobacco smoke. She has never used smokeless tobacco. She reports current alcohol use. She reports that she does not use drugs.  Patient is widowed.  She has no children she lives at home alone.  She is originally from [place], [place] and moved here for warmer weather.  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  ADVANCE DIRECTIVES:  Not on file  CODE STATUS: DNR (DNR form signed on 06/08/22)  PAST MEDICAL HISTORY: Past Medical History:  Diagnosis Date   Carpal tunnel syndrome on both sides 03/2018   Cellulitis and abscess of lower extremity 03/2018   bilateral lower extrems, treated with antibx x 10 days   Diabetes mellitus without complication ([place])    Dyspnea 03/2018   with any exercise, due to weight   Eczema    Endometrial cancer ([place]) 08/26/2020    Hyperlipidemia    Hypertension    Morbid obesity with BMI of 70 and over, adult ([place]) 03/2018   Sleep apnea 03/2018   uses a cpap    PAST SURGICAL HISTORY:  Past Surgical History:  Procedure Laterality Date   CARPAL TUNNEL RELEASE Left 04/12/2018   Procedure: CARPAL TUNNEL RELEASE;  Surgeon: Hessie Knows, MD;  Location: [facility] ORS;  Service: Orthopedics;  Laterality: Left;   COLONOSCOPY WITH PROPOFOL N/A 12/02/2019   Procedure: COLONOSCOPY WITH PROPOFOL;  Surgeon: Toledo, Benay Pike, MD;  Location: [facility] END[facility];  Service: Gastroenterology;  Laterality: N/A;   DILATION AND CURETTAGE OF UTERUS  03/2018   has had 10 d & c's with hysteroscopy   HERNIA REPAIR  8563   umbilical w/ obstructed bowel   IR IMAGING GUIDED PORT INSERTION  02/17/2022   LIPOMA EXCISION Right 1997   shoulder   obstructive bowel surgery  01/2010    HEMATOLOGY/ONCOLOGY HISTORY:  Oncology History  Endometrial cancer ([place])  07/30/2020 Initial Diagnosis   She was seen by Dr. Leonides Schanz of OBGYN for post menopausal bleeding x 7-8 years.    08/14/2020 Imaging   Pelvic US showed  FINDINGS: Uterus: Measurements: 7.7 x 5.3 x 5.8 cm = volume: 123 mL. No fibroids or other mass visualized. Endometrium: Thickness: 10 mm.  No focal abnormality visualized. Right ovary: Nonvisualized. Left ovary: Nonvisualized. Other findings: No abnormal free fluid.   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 No or minimal myometrial invasion, less than 50%, suggesting stage 1a disease by imaging.  -She was not a surgical candidate due to morbid  obesity.  -S/p IUD Mirena by Dr. Leafy Ro 4/22 - Repeat EMBx on 03/24/21 showed persistent grade 1 endometrioid cancer.  - Unable to tolerate Norethidrone and IUD expelled.       Radiation Therapy   - S/p EBRT at [facility] followed by 4 fractions of vaginal brachytherapy at [place] completed on 02/11/2352.   - complicated by  radiation cystitis and diarrhea. Was seen by Dr. Delaine Lame of ID. Diagnosed with VRE colonization. Symptoms have largely improved since.     01/19/2022 Progression   PET scan performed at [facility]-  IMPRESSION:  1.  Focal area of markedly increased FDG activity in the fundus of the uterus which may represent uterine cancer  2.  Subcentimeter right pelvic and presacral lymph nodes with intense FDG activity concerning for nodal metastases.    02/24/2022 -  Chemotherapy   Patient is on Treatment Plan :  Dose reduced.   Carboplatin AUC 4 + Paclitaxel 135 mg/m2 q21d due to functional status and pre-existing peripheral neuropathy.        ALLERGIES:  is allergic to megace [megestrol], penicillins, ibuprofen, and pravastatin.  MEDICATIONS:  Current Outpatient Medications  Medication Sig Dispense Refill   dexamethasone (DECADRON) 4 MG tablet Take 2 tablets (8 mg total) by mouth daily for 3 days. Start the day after chemotherapy. Take with food. (Patient not taking: Reported on 05/18/2022) 30 tablet 1   Easy Touch Lancets 30G/Twist MISC  (Patient not taking: Reported on 05/18/2022)     ergocalciferol (VITAMIN D2) 1.25 MG (50000 UT) capsule Take 1 capsule (50,000 Units total) by mouth once a week. 30 capsule 3   lidocaine-prilocaine (EMLA) cream Apply 1 Application topically as needed. Apply to port 1 hour prior to chemotherapy. Cover with plastic wrap. 30 g 2   losartan (COZAAR) 50 MG tablet Take 50 mg by mouth daily.     magnesium chloride (SLOW-MAG) 64 MG TBEC SR tablet Take 1 tablet (64 mg total) by mouth daily. (Patient not taking: Reported on 06/21/2022) 15 tablet 0   ondansetron (ZOFRAN) 8 MG tablet Take 1 tablet (8 mg total) by mouth every 8 (eight) hours as needed for nausea or vomiting. Start on the third day after chemotherapy. (Patient not taking: Reported on 05/04/2022) 30 tablet 1   potassium chloride (KLOR-CON M) 10 MEQ tablet Take 1 tablet (10 mEq total) by mouth daily. (Patient not taking:  Reported on 06/21/2022) 14 tablet 0   prochlorperazine (COMPAZINE) 10 MG tablet Take 1 tablet (10 mg total) by mouth every 6 (six) hours as needed for nausea or vomiting. (Patient not taking: Reported on 03/15/2022) 30 tablet 1   tirzepatide (MOUNJARO) 5 MG/0.5ML Pen INJECT 5 MG UNDER THE SKIN EVERY 7 DAYS     No current facility-administered medications for this visit.    VITAL SIGNS: There were no vitals taken for this visit. There were no vitals filed for this visit.  Estimated body mass index is 58.95 kg/m as calculated from the following:   Height as of 02/28/22: '5\' 1"'$  (1.549 m).   Weight as of an earlier encounter on 06/21/22: 312 lb (141.5 kg).  LABS: CBC:    Component Value Date/Time   WBC 5.1 06/21/2022 0954   HGB 8.9 (L) 06/21/2022 0954   HCT 28.6 (L) 06/21/2022 0954   HCT 27.9 (L) 05/04/2022 1119   PLT 290 06/21/2022 0954   MCV 87.2 06/21/2022 0954   NEUTROABS 3.5 06/21/2022 0954   LYMPHSABS 0.7 06/21/2022 0954   MONOABS 0.9 06/21/2022 0954  EOSABS 0.1 06/21/2022 0954   BASOSABS 0.0 06/21/2022 0954   Comprehensive Metabolic Panel:    Component Value Date/Time   NA 139 06/21/2022 0954   K 3.8 06/21/2022 0954   CL 103 06/21/2022 0954   CO2 26 06/21/2022 0954   BUN 13 06/21/2022 0954   CREATININE 0.62 06/21/2022 0954   GLUCOSE 136 (H) 06/21/2022 0954   CALCIUM 8.4 (L) 06/21/2022 0954   AST 17 06/21/2022 0954   ALT 8 06/21/2022 0954   ALKPHOS 41 06/21/2022 0954   BILITOT 0.6 06/21/2022 0954   PROT 7.0 06/21/2022 0954   ALBUMIN 3.2 (L) 06/21/2022 0954    RADIOGRAPHIC STUDIES: No results found.  PERFORMANCE STATUS (ECOG) : 2 - Symptomatic, <50% confined to bed  Review of Systems Unless otherwise noted, a complete review of systems is negative.  Physical Exam General: NAD Cardiovascular: regular rate and rhythm Pulmonary: clear ant fields Abdomen: soft, nontender, + bowel sounds GU: no suprapubic tenderness Extremities: no edema, no joint  deformities Skin: no rashes Neurological: Weakness but otherwise nonfocal  IMPRESSION: Follow-up visit.  Patient was accompanied by her friend.  Patient reports that she feels better today but overall has not improved much.  She still feels severely fatigued and weak at baseline.  She has started outpatient PT at [facility].   Patient is pending psychology eval.  She has looked into acupuncture for neuropathy.  She continues to endorse pain and insomnia but is not interested in medications for management.  No new symptomatic complaints or concerns.   PLAN: -Continue current scope of treatment -DNR -Follow-up in 1 month  Case and plan discussed with Dr. Loni Muse  Patient expressed understanding and was in agreement with this plan. She also understands that She can call the clinic at any time with any questions, concerns, or complaints.     Time Total: 15 minutes  Visit consisted of counseling and education dealing with the complex and emotionally intense issues of symptom management and palliative care in the setting of serious and potentially life-threatening illness.Greater than 50%  of this time was spent counseling and coordinating care related to the above assessment and plan.  Signed by: Altha Harm, PhD, NP-C

## 2022-06-21 NOTE — Progress Notes (Signed)
Patient feeling extremely weak, no appetite, irregular bowels with diarrhea and constipation.  Back pain 7/10 due to sciatica pain.  Increased dyspnea on exertion for the past couple of weeks, pulse ox 94% today.

## 2022-06-21 NOTE — Progress Notes (Signed)
[facility] OFFICE PROGRESS NOTE  Patient Care Team: Sofie Hartigan, MD as PCP - General (Family Medicine) Clent Jacks, RN as Oncology Nurse Navigator [place], [place], MD as Referring Physician (Gynecologic Oncology) Noreene Filbert, MD as Consulting Physician (Radiation Oncology) Shirline Frees, MD as Referring Physician (Radiation Oncology) Lonia Farber, MD as Consulting Physician (Endocrinology)    ASSESSMENT & PLAN:  Kathryn Maddox is a 72 yo F with pmh of morbid obesity, HTN and HLD was diagnosed with endometrioid cancer, grade 1, staged 1A per imaging in Feb 2022, referred to medical oncology to discuss about systemic treatment options upon progression.    #Endometrioid cancer, Stage III per imaging, pMMR and p53 wildtype - diagnosed with stage 1A grade 1 endometrioid cancer in Feb 2022 after post menopausal bleeding.  - not a surgical candidate due to morbid obesity  - s/p Mirena IUD with persistance of disease evident on repeat EMBx done in 10/22  - could not tolerate Norethindrone  - S/p EBRT at [facility] followed by 4 fractions of vaginal brachytherapy at [place] completed on 10/04/2021.  - PET/CT from 01/19/2022 showed focal area of increased FDG activity in fundus of the uterus and right pelvic and presacral lymph nodes concerning for nodal metastases.    -Patient completed 4 cycles of carboplatin, Taxol and pembrolizumab (added with cycle 2) on 04/26/2022.  After fourth cycle, patient had significant decline in the functional status, generalized weakness fatigue and worsening neuropathy.  Repeat PET CT scan done at [facility] ([place] showed interval decrease in the size of previously seen right pelvic and presacral lymph nodes.  However there is enlarged uterus with interval increase in multifocal marked FDG avidity concerning for worsening tumor burden.  No role for further RT, surgery or chemotherapy.  Plan is to do a trial of letrozole once her functional  status improves.  -Patient continues to feel weak and has back pain from her sciatica.  Her appetite continues to be poor.  She is able to sleep well for the past 2 nights.  She will start physical therapy on January 15.  She is also scheduled to see psychotherapy end of the month.  # Hypomagnesemia -Patient will take Slow-Mag which was prescribed last time by palliative.  # access - port   RTC in 4 weeks for MD visit, port labs, port flush.  No orders of the defined types were placed in this encounter.   All questions were answered. The patient knows to call the clinic with any problems, questions or concerns. The total time spent in the appointment was 25 minutes encounter with patients including review of chart and various tests results, discussions about plan of care and coordination of care plan   [name], MD 06/21/2022 2:13 PM  INTERVAL HISTORY: Patient is a 72 year old female with diagnosis of stage III endometrioid cancer s/p Mirena IUD, could not tolerate norethindrone, EBRT and vaginal brachytherapy, completed 4 cycles of carbo, Taxol and pembrolizumab.  -Patient continues to feel weak and has back pain from her sciatica.  Her appetite continues to be poor.  She is able to sleep well for the past 2 nights.  She will start physical therapy on January 15.  She is also scheduled to see psychotherapy end of the month.    REVIEW OF SYSTEMS:   Pertinent positive ROS as above. All other systems were reviewed with the patient and are negative.  I have reviewed the past medical history, past surgical history, social history and  family history with the patient and they are unchanged from previous note.  ALLERGIES:  is allergic to megace [megestrol], penicillins, ibuprofen, and pravastatin.  MEDICATIONS:  Current Outpatient Medications  Medication Sig Dispense Refill   ergocalciferol (VITAMIN D2) 1.25 MG (50000 UT) capsule Take 1 capsule (50,000 Units total) by mouth once a  week. 30 capsule 3   lidocaine-prilocaine (EMLA) cream Apply 1 Application topically as needed. Apply to port 1 hour prior to chemotherapy. Cover with plastic wrap. 30 g 2   losartan (COZAAR) 50 MG tablet Take 50 mg by mouth daily.     tirzepatide (MOUNJARO) 5 MG/0.5ML Pen INJECT 5 MG UNDER THE SKIN EVERY 7 DAYS     dexamethasone (DECADRON) 4 MG tablet Take 2 tablets (8 mg total) by mouth daily for 3 days. Start the day after chemotherapy. Take with food. (Patient not taking: Reported on 05/18/2022) 30 tablet 1   Easy Touch Lancets 30G/Twist MISC  (Patient not taking: Reported on 05/18/2022)     magnesium chloride (SLOW-MAG) 64 MG TBEC SR tablet Take 1 tablet (64 mg total) by mouth daily. (Patient not taking: Reported on 06/21/2022) 15 tablet 0   ondansetron (ZOFRAN) 8 MG tablet Take 1 tablet (8 mg total) by mouth every 8 (eight) hours as needed for nausea or vomiting. Start on the third day after chemotherapy. (Patient not taking: Reported on 05/04/2022) 30 tablet 1   potassium chloride (KLOR-CON M) 10 MEQ tablet Take 1 tablet (10 mEq total) by mouth daily. (Patient not taking: Reported on 06/21/2022) 14 tablet 0   prochlorperazine (COMPAZINE) 10 MG tablet Take 1 tablet (10 mg total) by mouth every 6 (six) hours as needed for nausea or vomiting. (Patient not taking: Reported on 03/15/2022) 30 tablet 1   No current facility-administered medications for this visit.    SUMMARY OF ONCOLOGIC HISTORY: Oncology History  Endometrial cancer ([place])  07/30/2020 Initial Diagnosis   She was seen by Dr. Leonides Schanz of OBGYN for post menopausal bleeding x 7-8 years.    08/14/2020 Imaging   Pelvic US showed  FINDINGS: Uterus: Measurements: 7.7 x 5.3 x 5.8 cm = volume: 123 mL. No fibroids or other mass visualized. Endometrium: Thickness: 10 mm.  No focal abnormality visualized. Right ovary: Nonvisualized. Left ovary: Nonvisualized. Other findings: No abnormal free fluid.   08/14/2020 Initial Biopsy   EMBX showed a  WELL DIFFERENTIATED ENDOMETRIAL ADENOCARCINOMA   MRI with 13 mm stripe. No or minimal myometrial invasion, less than 50%, suggesting stage 1a disease by imaging.  -She was not a surgical candidate due to morbid obesity.  -S/p IUD Mirena by Dr. Leafy Ro 4/22 - Repeat EMBx on 03/24/21 showed persistent grade 1 endometrioid cancer.  - Unable to tolerate Norethidrone and IUD expelled.       Radiation Therapy   - S/p EBRT at [facility] followed by 4 fractions of vaginal brachytherapy at [place] completed on 1/51/7616.   - complicated by radiation cystitis and diarrhea. Was seen by Dr. Delaine Lame of ID. Diagnosed with VRE colonization. Symptoms have largely improved since.     01/19/2022 Progression   PET scan performed at [facility]-  IMPRESSION:  1.  Focal area of markedly increased FDG activity in the fundus of the uterus which may represent uterine cancer  2.  Subcentimeter right pelvic and presacral lymph nodes with intense FDG activity concerning for nodal metastases.    02/24/2022 -  Chemotherapy   Patient is on Treatment Plan :  Dose reduced.   Carboplatin AUC 4 +  Paclitaxel 135 mg/m2 q21d due to functional status and pre-existing peripheral neuropathy.        PHYSICAL EXAMINATION: ECOG PERFORMANCE STATUS: 2 - Symptomatic, <50% confined to bed  Vitals:   06/21/22 1000  BP: (!) 146/78  Pulse: 88  Resp: 18  Temp: (!) 97.2 F (36.2 C)  SpO2: 94%     Filed Weights   06/21/22 1000  Weight: (!) 312 lb (141.5 kg)     GENERAL:alert, no distress and comfortable SKIN: skin color, texture, turgor are normal, no rashes or significant lesions EYES: normal, Conjunctiva are pink and non-injected, sclera clear OROPHARYNX:no exudate, no erythema and lips, buccal mucosa, and tongue normal  NECK: supple, thyroid normal size, non-tender, without nodularity LYMPH:  no palpable lymphadenopathy in the cervical, axillary or inguinal LUNGS: clear to auscultation and percussion with normal breathing  effort HEART: regular rate & rhythm and no murmurs and no lower extremity edema ABDOMEN:abdomen soft, non-tender and normal bowel sounds Musculoskeletal:no cyanosis of digits and no clubbing  NEURO: alert & oriented x 3 with fluent speech, no focal motor/sensory deficits  LABORATORY DATA:  I have reviewed the data as listed    Component Value Date/Time   NA 139 06/21/2022 0954   K 3.8 06/21/2022 0954   CL 103 06/21/2022 0954   CO2 26 06/21/2022 0954   GLUCOSE 136 (H) 06/21/2022 0954   BUN 13 06/21/2022 0954   CREATININE 0.62 06/21/2022 0954   CALCIUM 8.4 (L) 06/21/2022 0954   PROT 7.0 06/21/2022 0954   ALBUMIN 3.2 (L) 06/21/2022 0954   AST 17 06/21/2022 0954   ALT 8 06/21/2022 0954   ALKPHOS 41 06/21/2022 0954   BILITOT 0.6 06/21/2022 0954   GFRNONAA >60 06/21/2022 0954    No results found for: "SPEP", "UPEP"  Lab Results  Component Value Date   WBC 5.1 06/21/2022   NEUTROABS 3.5 06/21/2022   HGB 8.9 (L) 06/21/2022   HCT 28.6 (L) 06/21/2022   MCV 87.2 06/21/2022   PLT 290 06/21/2022      Chemistry      Component Value Date/Time   NA 139 06/21/2022 0954   K 3.8 06/21/2022 0954   CL 103 06/21/2022 0954   CO2 26 06/21/2022 0954   BUN 13 06/21/2022 0954   CREATININE 0.62 06/21/2022 0954      Component Value Date/Time   CALCIUM 8.4 (L) 06/21/2022 0954   ALKPHOS 41 06/21/2022 0954   AST 17 06/21/2022 0954   ALT 8 06/21/2022 0954   BILITOT 0.6 06/21/2022 0954       RADIOGRAPHIC STUDIES: I have personally reviewed the radiological images as listed and agreed with the findings in the report. No results found.

## 2022-06-22 ENCOUNTER — Encounter: Payer: Self-pay | Admitting: Internal Medicine

## 2022-06-28 ENCOUNTER — Inpatient Hospital Stay: Payer: PPO

## 2022-06-28 ENCOUNTER — Encounter: Payer: Self-pay | Admitting: Internal Medicine

## 2022-06-28 NOTE — Progress Notes (Signed)
Nutrition Follow-up:  Patient with stage III endometrial cancer.  Received last infusion of carbo/taxol and keytruda on 04/26/22.  Planning to start letrozole in the future.  Spoke with patient via phone for nutrition follow-up.  Patient reports that she is eating better.  Just finished egg drop soup and rice.  Says that she has been eating some meat, pasta and salad.  "I am venturing out and trying to eat more foods."    Noted started PT  Medications: reviewed  Labs: reviewed  Anthropometrics:   Weight 312 lb on 1/2 318 lb 4.8 oz on 11/29 331 lb on 11/4 341 lb on 10/17   NUTRITION DIAGNOSIS: Inadequate oral intake improved    INTERVENTION:  Encouraged patient to continue eating small frequent meals/snacks including good sources of protein.  Patient denies additional questions or concerns about nutrition.   Patient has RD contact information and will contact RD if needed in the future     NEXT VISIT: no follow-up planned RD available if needed  Latrel Szymczak B. Zenia Resides, [place], [place] Registered Dietitian [phone]

## 2022-07-04 ENCOUNTER — Ambulatory Visit: Payer: HMO | Admitting: Physical Therapy

## 2022-07-05 ENCOUNTER — Other Ambulatory Visit: Payer: PPO

## 2022-07-05 VITALS — HR 87 | Temp 97.5°F

## 2022-07-05 DIAGNOSIS — Z515 Encounter for palliative care: Secondary | ICD-10-CM

## 2022-07-05 NOTE — Progress Notes (Signed)
PATIENT NAME: Kathryn Maddox DOB: January 03, 1951 MRN: [id number]  PRIMARY CARE PROVIDER: Sofie Hartigan, MD  RESPONSIBLE PARTY:  Acct ID - Guarantor Home Phone Work Phone Relationship Acct Type  [id number] Delrae [phone]  Self P/F     [address] [place],  [address]   Home visit completed with patient and Kathryn Maddox, SW.  Appetite:  eating 1 meal a day with good liquid intake.  Ongoing weight loss.  Most recent weight 312 lbs.   Grief: Patient continues to process through her situation and medical condition.  She often uses humor to help during the conversation.  She has a friend who checks in on her frequently during the week.  Patient feels this friend has helped her significantly with household chores but also mentally.  Active listening provided.   Mobility:  Using a rolling walker for safe ambulation.  No falls.   Has a friend who comes in a couple of times a week to assist with household chores.  Patient mostly sitting with little activity.   Insomnia:  Unable to sleep well due to sciatica pain.  Often waking up every 15-30 minutes.  Does not wish to take any medications to address this.   Medication Management:  Patient advised she is not taking MVI at this time.  She continues to manage her own medications without difficulty.  Pain:  Only taking acetaminophen as need.  Does not wish to take anything stronger due to constipation issues.   Weakness: Ongoing.  Was suppose to start with outpatient PT yesterday but this has changed to March.  Patient hopeful she will be able to start sooner.   CODE STATUS: DNR-form on fridge ADVANCED DIRECTIVES: Yes MOST FORM: No PPS: 50%-weak   PHYSICAL EXAM:   VITALS: Today's Vitals   07/05/22 1303  Pulse: 87  Temp: (!) 97.5 F (36.4 C)  SpO2: 97%    LUNGS: clear to auscultation  CARDIAC: Cor RRR}  EXTREMITIES: - for edema SKIN: Skin color, texture, turgor normal. No rashes or lesions or normal  NEURO: positive  for gait problems and weakness       Lorenza Burton, RN

## 2022-07-05 NOTE — Progress Notes (Signed)
COMMUNITY PALLIATIVE CARE SW NOTE  PATIENT NAME: Kathryn Maddox DOB: May 08, 1951 MRN: [id number]  PRIMARY CARE PROVIDER: Sofie Hartigan, MD  RESPONSIBLE PARTY:  Acct ID - Guarantor Home Phone Work Phone Relationship Acct Type  [id number] Kathryn, [phone]  Self P/F     [address], [place], [place] [address]     PLAN OF CARE and INTERVENTIONS:           GOALS OF CARE/ ADVANCE CARE PLANNING:    Goals include to maximize quality of life and assist with pain management. Our advance care planning conversation included a discussion about:    The value and importance of advance care planning  Review and updating or creation of an advance directive document.                       Patient is a FULL CODE - MOST form reviewed, and blank form left in home for patient review. Patient has a will and patients friend, Kathryn Maddox, holds [place].   2.                Palliative care encounter: SW and RN completed joint in home visit with patient.   Functional changes/updates: patient with endometrial cancer.  Patient continues to experiences constant pain mainly in the back. Patient was supposed to attend outpatient therapy on 1/15 however this has been moved to march. Patient share that she is experiencing a cough and dry mouth.   Patient ambulates with rollator.      Psychosocial assessment: completed.    In home support: patient resides independently and has support from a friend. Cancer center SW has provided resources.   Transportation: no needs.   Food: no food insecurities witnessed. Patient shared that she has a poor appetite but does not desire pursuing an appetite stimulant. Patient also not interested in supplements like ensure or boost, out of fear of possible diarrhea.    Safety and long term planning: patient feels safe in his home and desires to remain in her home. Life alert system discussed.   SW discussed goals, reviewed care plan, provided emotional support, used  active and reflective listening in the form of reciprocity emotional response. Questions and concerns were addressed. The patient/family was encouraged to call with any additional questions and/or concerns. PC Provided general support and encouragement, no other unmet needs identified. Will continue to follow.   3.         PATIENT/CAREGIVER EDUCATION/ COPING:   Appearance: well groomed, appropriate given situation  Mental Status: Alert/oriented. Eye Contact: Good. Able to engage in proper eye contact  Thought Process: rational  Thought Content: not assessed  Speech: normal  Mood: Normal and calm Affect: Congruent to endorsed mood, full ranging Insight: normal Judgement: normal  Interaction Style: Cooperative   Patient A&O, patient engaged in fluent conversation and answered all questions appropriately.    4.         PERSONAL EMERGENCY PLAN:  Patient will call 9-1-1 for emergencies.    5.         COMMUNITY RESOURCES COORDINATION/ HEALTH CARE NAVIGATION:  patient manages her care.    6.      FINANCIAL CONCERNS/NEEDS: None. Patient does not qualify for community Medicaid.                         Primary Health Insurance:  HTA Medicare Secondary Health Insurance: Medicaid Prescription Coverage: Yes, no history of difficulty  obtaining or affording prescriptions reported.     SOCIAL HX:  Social History   Tobacco Use   Smoking status: Never    Passive exposure: Never   Smokeless tobacco: Never  Substance Use Topics   Alcohol use: Yes    Comment: rarely    CODE STATUS: DNR  ADVANCED DIRECTIVES: Y MOST FORM COMPLETE:  Y HOSPICE EDUCATION PROVIDED: N       [name], LCSW

## 2022-07-11 DIAGNOSIS — E782 Mixed hyperlipidemia: Secondary | ICD-10-CM | POA: Diagnosis not present

## 2022-07-11 DIAGNOSIS — E1142 Type 2 diabetes mellitus with diabetic polyneuropathy: Secondary | ICD-10-CM | POA: Diagnosis not present

## 2022-07-11 DIAGNOSIS — I10 Essential (primary) hypertension: Secondary | ICD-10-CM | POA: Diagnosis not present

## 2022-07-14 ENCOUNTER — Ambulatory Visit (INDEPENDENT_AMBULATORY_CARE_PROVIDER_SITE_OTHER): Payer: PPO | Admitting: Psychologist

## 2022-07-14 ENCOUNTER — Ambulatory Visit: Payer: HMO | Admitting: Psychologist

## 2022-07-14 DIAGNOSIS — F320 Major depressive disorder, single episode, mild: Secondary | ICD-10-CM | POA: Diagnosis not present

## 2022-07-14 DIAGNOSIS — G4733 Obstructive sleep apnea (adult) (pediatric): Secondary | ICD-10-CM | POA: Diagnosis not present

## 2022-07-14 NOTE — Progress Notes (Signed)
                Petula Rotolo, PsyD 

## 2022-07-14 NOTE — Progress Notes (Signed)
[facility] Counselor Initial Adult Exam  Name: Kathryn Maddox Date: 07/14/2022 MRN: [id number] DOB: 07/06/1950 PCP: Sofie Hartigan, MD  Time spent: 10:06 am to 10:46 am; total time: 40 minutes  This session was held via phone teletherapy due to the coronavirus risk at this time. The patient consented to phone teletherapy and was located at her home during this session. She is aware it is the responsibility of the patient to secure confidentiality on her end of the session. The provider was in a private home office for the duration of this session. Limits of confidentiality were discussed with the patient.    Guardian/Payee:  NA    Paperwork requested: No   Reason for Visit /Presenting Problem: Pain, irritability, and frustrations  Mental Status Exam: Appearance:   NA     Behavior:  Appropriate  Motor:  NA  Speech/Language:   Clear and Coherent  Affect:  Appropriate  Mood:  normal  Thought process:  normal  Thought content:    WNL  Sensory/Perceptual disturbances:    WNL  Orientation:  oriented to person  Attention:  Good  Concentration:  Good  Memory:  WNL  Fund of knowledge:   Good  Insight:    Good  Judgment:   Good  Impulse Control:  Good     Reported Symptoms:  The patient endorsed experiencing the following: sadness, irritability, anger, fatigue, lack of motivation, and low self-esteem. She denied suicidal and homicidal ideation.   Risk Assessment: Danger to Self:  No Self-injurious Behavior: No Danger to Others: No Duty to Warn:no Physical Aggression / Violence:No  Access to Firearms a concern: No  Gang Involvement:No  Patient / guardian was educated about steps to take if suicide or homicide risk level increases between visits: no While future psychiatric events cannot be accurately predicted, the patient does not currently require acute inpatient psychiatric care and does not currently meet [facility] involuntary commitment  criteria.  Substance Abuse History: Current substance abuse: No     Past Psychiatric History:   No previous psychological problems have been observed Outpatient Providers:NA History of Psych Hospitalization: No  Psychological Testing:  NA    Abuse History:  Victim of: No.,  NA    Report needed: No. Victim of Neglect:No. Perpetrator of  NA   Witness / Exposure to Domestic Violence: No   Protective Services Involvement: No  Witness to [employer] Violence:  No   Family History:  Family History  Problem Relation Age of Onset   Breast cancer Neg Hx     Living situation: the patient lives alone  Sexual Orientation: Straight  Relationship Status: single  Name of spouse / other:NA If a parent, number of children / ages:NA  Support Systems: friends  Museum/gallery curator Stress:  No   Income/Employment/Disability: Actor: No   Educational History: Education: Scientist, product/process development: NA  Any cultural differences that may affect / interfere with treatment:  not applicable   Recreation/Hobbies: Being with friends  Stressors: Health problems    Strengths: Supportive Relationships  Barriers:  NA   Legal History: Pending legal issue / charges: The patient has no significant history of legal issues. History of legal issue / charges:  NA  Medical History/Surgical History: reviewed Past Medical History:  Diagnosis Date   Carpal tunnel syndrome on both sides 03/2018   Cellulitis and abscess of lower extremity 03/2018   bilateral lower extrems, treated with antibx x 10 days   Diabetes  mellitus without complication ([place])    Dyspnea 03/2018   with any exercise, due to weight   Eczema    Endometrial cancer ([place]) 08/26/2020   Hyperlipidemia    Hypertension    Morbid obesity with BMI of 70 and over, adult ([place]) 03/2018   Sleep apnea 03/2018   uses a cpap    Past Surgical History:  Procedure Laterality Date    CARPAL TUNNEL RELEASE Left 04/12/2018   Procedure: CARPAL TUNNEL RELEASE;  Surgeon: Hessie Knows, MD;  Location: [facility] ORS;  Service: Orthopedics;  Laterality: Left;   COLONOSCOPY WITH PROPOFOL N/A 12/02/2019   Procedure: COLONOSCOPY WITH PROPOFOL;  Surgeon: Toledo, Benay Pike, MD;  Location: [facility] ENDOSCOPY;  Service: Gastroenterology;  Laterality: N/A;   DILATION AND CURETTAGE OF UTERUS  03/2018   has had 10 d & c's with hysteroscopy   HERNIA REPAIR  3244   umbilical w/ obstructed bowel   IR IMAGING GUIDED PORT INSERTION  02/17/2022   LIPOMA EXCISION Right 1997   shoulder   obstructive bowel surgery  01/2010    Medications: Current Outpatient Medications  Medication Sig Dispense Refill   dexamethasone (DECADRON) 4 MG tablet Take 2 tablets (8 mg total) by mouth daily for 3 days. Start the day after chemotherapy. Take with food. (Patient not taking: Reported on 05/18/2022) 30 tablet 1   Easy Touch Lancets 30G/Twist MISC  (Patient not taking: Reported on 05/18/2022)     ergocalciferol (VITAMIN D2) 1.25 MG (50000 UT) capsule Take 1 capsule (50,000 Units total) by mouth once a week. 30 capsule 3   lidocaine-prilocaine (EMLA) cream Apply 1 Application topically as needed. Apply to port 1 hour prior to chemotherapy. Cover with plastic wrap. 30 g 2   losartan (COZAAR) 50 MG tablet Take 50 mg by mouth daily.     magnesium chloride (SLOW-MAG) 64 MG TBEC SR tablet Take 1 tablet (64 mg total) by mouth daily. (Patient not taking: Reported on 06/21/2022) 15 tablet 0   ondansetron (ZOFRAN) 8 MG tablet Take 1 tablet (8 mg total) by mouth every 8 (eight) hours as needed for nausea or vomiting. Start on the third day after chemotherapy. (Patient not taking: Reported on 05/04/2022) 30 tablet 1   potassium chloride (KLOR-CON M) 10 MEQ tablet Take 1 tablet (10 mEq total) by mouth daily. (Patient not taking: Reported on 06/21/2022) 14 tablet 0   prochlorperazine (COMPAZINE) 10 MG tablet Take 1 tablet (10 mg total) by  mouth every 6 (six) hours as needed for nausea or vomiting. (Patient not taking: Reported on 03/15/2022) 30 tablet 1   tirzepatide (MOUNJARO) 5 MG/0.5ML Pen INJECT 5 MG UNDER THE SKIN EVERY 7 DAYS     No current facility-administered medications for this visit.    Allergies  Allergen Reactions   Megace [Megestrol] Rash   Penicillins Rash and Other (See Comments)    Has patient had a PCN reaction causing immediate rash, facial/tongue/throat swelling, SOB or lightheadedness with hypotension: Unknown Has patient had a PCN reaction causing severe rash involving mucus membranes or skin necrosis: Unknown Has patient had a PCN reaction that required hospitalization: Unknown Has patient had a PCN reaction occurring within the last 10 years: No If all of the above answers are "NO", then may proceed with Cephalosporin use.    Ibuprofen Hypertension   Pravastatin Other (See Comments)    Muscle pain, extreme constipation    Diagnoses:  F32.0 major depressive affective disorder, single episode, mild   Plan of Care: The patient  is a 72 year old Caucasian female who was referred due to experiencing some depression. The patient lives alone. The patient meets criteria for a diagnosis of F32.0 major depressive affective disorder, single episode, mild based off of the following: sadness, irritability, anger, fatigue, lack of motivation, and low self-esteem. She denied suicidal and homicidal ideation.   The patient stated that right now she is wanting more physical therapy and not talk therapy.   This psychologist makes the recommendation that if the patient wants that she participate in therapy once a month.    Conception Chancy, PsyD

## 2022-07-15 ENCOUNTER — Encounter: Payer: Self-pay | Admitting: Internal Medicine

## 2022-07-15 ENCOUNTER — Inpatient Hospital Stay: Payer: PPO

## 2022-07-15 ENCOUNTER — Inpatient Hospital Stay (HOSPITAL_BASED_OUTPATIENT_CLINIC_OR_DEPARTMENT_OTHER): Payer: PPO | Admitting: Internal Medicine

## 2022-07-15 VITALS — BP 116/64 | HR 92 | Temp 97.8°F | Resp 19 | Wt 304.4 lb

## 2022-07-15 DIAGNOSIS — C541 Malignant neoplasm of endometrium: Secondary | ICD-10-CM

## 2022-07-15 DIAGNOSIS — R531 Weakness: Secondary | ICD-10-CM

## 2022-07-15 DIAGNOSIS — Z95828 Presence of other vascular implants and grafts: Secondary | ICD-10-CM

## 2022-07-15 LAB — COMPREHENSIVE METABOLIC PANEL
ALT: 9 U/L (ref 0–44)
AST: 20 U/L (ref 15–41)
Albumin: 3.2 g/dL — ABNORMAL LOW (ref 3.5–5.0)
Alkaline Phosphatase: 41 U/L (ref 38–126)
Anion gap: 11 (ref 5–15)
BUN: 10 mg/dL (ref 8–23)
CO2: 25 mmol/L (ref 22–32)
Calcium: 8.7 mg/dL — ABNORMAL LOW (ref 8.9–10.3)
Chloride: 100 mmol/L (ref 98–111)
Creatinine, Ser: 0.64 mg/dL (ref 0.44–1.00)
GFR, Estimated: >60 mL/min (ref >60)
Glucose, Bld: 139 mg/dL — ABNORMAL HIGH (ref 70–99)
Potassium: 3.4 mmol/L — ABNORMAL LOW (ref 3.5–5.1)
Sodium: 136 mmol/L (ref 135–145)
Total Bilirubin: 0.5 mg/dL (ref 0.3–1.2)
Total Protein: 6.9 g/dL (ref 6.5–8.1)

## 2022-07-15 LAB — CBC WITH DIFFERENTIAL/PLATELET
Abs Immature Granulocytes: 0.04 10*3/uL (ref 0.00–0.07)
Basophils Absolute: 0 10*3/uL (ref 0.0–0.1)
Basophils Relative: 0 %
Eosinophils Absolute: 0 10*3/uL (ref 0.0–0.5)
Eosinophils Relative: 1 %
HCT: 28.9 % — ABNORMAL LOW (ref 36.0–46.0)
Hemoglobin: 8.9 g/dL — ABNORMAL LOW (ref 12.0–15.0)
Immature Granulocytes: 1 %
Lymphocytes Relative: 15 %
Lymphs Abs: 0.7 10*3/uL (ref 0.7–4.0)
MCH: 26.1 pg (ref 26.0–34.0)
MCHC: 30.8 g/dL (ref 30.0–36.0)
MCV: 84.8 fL (ref 80.0–100.0)
Monocytes Absolute: 0.7 10*3/uL (ref 0.1–1.0)
Monocytes Relative: 17 %
Neutro Abs: 3 10*3/uL (ref 1.7–7.7)
Neutrophils Relative %: 66 %
Platelets: 261 10*3/uL (ref 150–400)
RBC: 3.41 MIL/uL — ABNORMAL LOW (ref 3.87–5.11)
RDW: 16.7 % — ABNORMAL HIGH (ref 11.5–15.5)
WBC: 4.5 10*3/uL (ref 4.0–10.5)
nRBC: 0 % (ref 0.0–0.2)

## 2022-07-15 LAB — MAGNESIUM: Magnesium: 1.6 mg/dL — ABNORMAL LOW (ref 1.7–2.4)

## 2022-07-15 MED ORDER — SODIUM CHLORIDE 0.9% FLUSH
10.0000 mL | Freq: Once | INTRAVENOUS | Status: AC
  Administered 2022-07-15: 10 mL via INTRAVENOUS
  Filled 2022-07-15: qty 10

## 2022-07-15 MED ORDER — HEPARIN SOD (PORK) LOCK FLUSH 100 UNIT/ML IV SOLN
500.0000 [IU] | Freq: Once | INTRAVENOUS | Status: AC
  Administered 2022-07-15: 500 [IU] via INTRAVENOUS
  Filled 2022-07-15: qty 5

## 2022-07-15 NOTE — Progress Notes (Signed)
Patient states she is really weak. She is not getting any sleep. Patient states her feet has been swelling. She states she would like MD to take a listen to her lungs because she had a lot of pain in the upper part of back. Patient would like in home physical therapy.

## 2022-07-15 NOTE — Progress Notes (Addendum)
[place] OFFICE PROGRESS NOTE  Patient Care Team: Sofie Hartigan, MD as PCP - General (Family Medicine) Clent Jacks, RN as Oncology Nurse Navigator DISH, [place], MD as Referring Physician (Gynecologic Oncology) Noreene Filbert, MD as Consulting Physician (Radiation Oncology) Shirline Frees, MD as Referring Physician (Radiation Oncology) Lonia Farber, MD as Consulting Physician (Endocrinology)    ASSESSMENT & PLAN:  Kathryn Maddox is a 72 yo F with pmh of morbid obesity, HTN and HLD was diagnosed with endometrioid cancer, grade 1, staged 1A per imaging in Feb 2022, referred to medical oncology to discuss about systemic treatment options upon progression.    #Endometrioid cancer, Stage III per imaging, pMMR and p53 wildtype - diagnosed with stage 1A grade 1 endometrioid cancer in Feb 2022 after post menopausal bleeding.  - not a surgical candidate due to morbid obesity  - s/p Mirena IUD with persistance of disease evident on repeat EMBx done in 10/22. Could not tolerate Norethindrone  - S/p EBRT at [place] followed by 4 fractions of vaginal brachytherapy at [place] completed on 10/04/2021.  - PET/CT from 01/19/2022 showed focal area of increased FDG activity in fundus of the uterus and right pelvic and presacral lymph nodes concerning for nodal metastases.    -Patient completed 4 cycles of carboplatin, Taxol and pembrolizumab (added with cycle 2) on 04/26/2022.  After fourth cycle, patient had significant decline in the functional status, generalized weakness fatigue and worsening neuropathy.  Repeat PET CT scan done at [facility] showed interval decrease in the size of previously seen right pelvic and presacral lymph nodes.  However there is enlarged uterus with interval increase in multifocal marked FDG avidity concerning for worsening tumor burden.  No role for further RT, surgery. Patient declined further chemotherapy.  Was evaluated by Dr. Fransisca Connors Gyn  Onc. Recommended of letrozole. Pt would like to hold off on any systemic treatment until she feels better. She is concerned about the side effects.   -Patient continues to feel poorly.  Her functional status continues to be down.  She feels very weak.  Her bowels have improved.  Her appetite is still low.  She has had weight loss.  She was supposed to start physical therapy on January 15 but that never happened and she was told that they did not have some equipment and now is being delayed in March.  -We will place a referral to home health again for them to look into home PT for her.  Patient is homebound.  She is very weak and cannot travel to any appointments.  Providing home PT will be a very useful resource for her to help with recovery and improvement in quality of life.  -I will also call physical therapy at [facility] where she is supposed to start PT in March to request for sooner appointments in the meantime.  # Hypomagnesemia -Mild, magnesium of 1.6.  Could not tolerate oral mag due to diarrhea.  Continue with dietary improvements.  # Hypokalemia -Mild, potassium 3.4.  Discussed about having bananas and oranges.  # access - port   Patient is also looking to get recliner which can be covered by Medicare.  Will request Teresita to look into that.  RTC in 8 weeks for MD visit, labs, port flush  Orders Placed This Encounter  Procedures   CBC with Differential/Platelet    Standing Status:   Future    Standing Expiration Date:   07/15/2023   Comprehensive metabolic panel  Standing Status:   Future    Standing Expiration Date:   07/15/2023   Magnesium    Standing Status:   Future    Standing Expiration Date:   07/15/2023   Ambulatory referral to Home Health    Referral Priority:   Routine    Referral Type:   Home Health Care    Referral Reason:   Specialty Services Required    Requested Specialty:   [place]    Number of Visits Requested:   1    All questions  were answered. The patient knows to call the clinic with any problems, questions or concerns. The total time spent in the appointment was 30 minutes encounter with patients including review of chart and various tests results, discussions about plan of care and coordination of care plan   [name], MD 07/15/2022 1:02 PM  INTERVAL HISTORY: Patient is a 72 year old female with diagnosis of stage III endometrioid cancer s/p Mirena IUD, could not tolerate norethindrone, EBRT and vaginal brachytherapy, completed 4 cycles of carbo, Taxol and pembrolizumab.  Patient continues to feel very poorly after completion of chemotherapy.  Her recovery is very slow.  She has back pain from sciatica which has been getting worse because she is not able to move around.  Her bowels have improved recently.  But her appetite has been poor.  She has lost significant weight.  She has new cough.  Likely has some viral bronchitis.  She could not start physical therapy as planned because the facility declined due to lack of self equipment.  Patient was feeling very angry and disappointed at the same time because she has not been able to get the resources that she has been working for specially physical therapy.  REVIEW OF SYSTEMS:   Pertinent positive ROS as above. All other systems were reviewed with the patient and are negative.  I have reviewed the past medical history, past surgical history, social history and family history with the patient and they are unchanged from previous note.  ALLERGIES:  is allergic to megace [megestrol], penicillins, ibuprofen, and pravastatin.  MEDICATIONS:  Current Outpatient Medications  Medication Sig Dispense Refill   ergocalciferol (VITAMIN D2) 1.25 MG (50000 UT) capsule Take 1 capsule (50,000 Units total) by mouth once a week. 30 capsule 3   lidocaine-prilocaine (EMLA) cream Apply 1 Application topically as needed. Apply to port 1 hour prior to chemotherapy. Cover with plastic  wrap. 30 g 2   losartan (COZAAR) 50 MG tablet Take 50 mg by mouth daily.     tirzepatide (MOUNJARO) 5 MG/0.5ML Pen INJECT 5 MG UNDER THE SKIN EVERY 7 DAYS     dexamethasone (DECADRON) 4 MG tablet Take 2 tablets (8 mg total) by mouth daily for 3 days. Start the day after chemotherapy. Take with food. (Patient not taking: Reported on 05/18/2022) 30 tablet 1   Easy Touch Lancets 30G/Twist MISC  (Patient not taking: Reported on 05/18/2022)     magnesium chloride (SLOW-MAG) 64 MG TBEC SR tablet Take 1 tablet (64 mg total) by mouth daily. (Patient not taking: Reported on 06/21/2022) 15 tablet 0   ondansetron (ZOFRAN) 8 MG tablet Take 1 tablet (8 mg total) by mouth every 8 (eight) hours as needed for nausea or vomiting. Start on the third day after chemotherapy. (Patient not taking: Reported on 05/04/2022) 30 tablet 1   potassium chloride (KLOR-CON M) 10 MEQ tablet Take 1 tablet (10 mEq total) by mouth daily. (Patient not taking: Reported on 06/21/2022)  14 tablet 0   prochlorperazine (COMPAZINE) 10 MG tablet Take 1 tablet (10 mg total) by mouth every 6 (six) hours as needed for nausea or vomiting. (Patient not taking: Reported on 03/15/2022) 30 tablet 1   No current facility-administered medications for this visit.    SUMMARY OF ONCOLOGIC HISTORY: Oncology History  Endometrial cancer ([place])  07/30/2020 Initial Diagnosis   She was seen by Dr. Leonides Schanz of OBGYN for post menopausal bleeding x 7-8 years.    08/14/2020 Imaging   Pelvic US showed  FINDINGS: Uterus: Measurements: 7.7 x 5.3 x 5.8 cm = volume: 123 mL. No fibroids or other mass visualized. Endometrium: Thickness: 10 mm.  No focal abnormality visualized. Right ovary: Nonvisualized. Left ovary: Nonvisualized. Other findings: No abnormal free fluid.   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 No or minimal myometrial invasion, less than 50%, suggesting stage 1a disease by  imaging.  -She was not a surgical candidate due to morbid obesity.  -S/p IUD Mirena by Dr. Leafy Ro 4/22 - Repeat EMBx on 03/24/21 showed persistent grade 1 endometrioid cancer.  - Unable to tolerate Norethidrone and IUD expelled.       Radiation Therapy   - S/p EBRT at [place] followed by 4 fractions of vaginal brachytherapy at [place] completed on 09/16/5186.   - complicated by radiation cystitis and diarrhea. Was seen by Dr. Delaine Lame of ID. Diagnosed with VRE colonization. Symptoms have largely improved since.     01/19/2022 Progression   PET scan performed at [facility]-  IMPRESSION:  1.  Focal area of markedly increased FDG activity in the fundus of the uterus which may represent uterine cancer  2.  Subcentimeter right pelvic and presacral lymph nodes with intense FDG activity concerning for nodal metastases.    02/24/2022 -  Chemotherapy   Patient is on Treatment Plan :  Dose reduced.   Carboplatin AUC 4 + Paclitaxel 135 mg/m2 q21d due to functional status and pre-existing peripheral neuropathy.        PHYSICAL EXAMINATION: ECOG PERFORMANCE STATUS: 2 - Symptomatic, <50% confined to bed  Vitals:   07/15/22 1007  BP: 116/64  Pulse: 92  Resp: 19  Temp: 97.8 F (36.6 C)  SpO2: 97%     Filed Weights   07/15/22 1007  Weight: (!) 304 lb 6.4 oz (138.1 kg)     GENERAL:alert, no distress and comfortable SKIN: skin color, texture, turgor are normal, no rashes or significant lesions EYES: normal, Conjunctiva are pink and non-injected, sclera clear OROPHARYNX:no exudate, no erythema and lips, buccal mucosa, and tongue normal  NECK: supple, thyroid normal size, non-tender, without nodularity LYMPH:  no palpable lymphadenopathy in the cervical, axillary or inguinal LUNGS: clear to auscultation and percussion with normal breathing effort HEART: regular rate & rhythm and no murmurs and no lower extremity edema ABDOMEN:abdomen soft, non-tender and normal bowel  sounds Musculoskeletal:no cyanosis of digits and no clubbing  NEURO: alert & oriented x 3 with fluent speech, no focal motor/sensory deficits  LABORATORY DATA:  I have reviewed the data as listed    Component Value Date/Time   NA 136 07/15/2022 0952   K 3.4 (L) 07/15/2022 0952   CL 100 07/15/2022 0952   CO2 25 07/15/2022 0952   GLUCOSE 139 (H) 07/15/2022 0952   BUN 10 07/15/2022 0952   CREATININE 0.64 07/15/2022 0952   CALCIUM 8.7 (L) 07/15/2022 0952   PROT 6.9 07/15/2022 0952   ALBUMIN  3.2 (L) 07/15/2022 0952   AST 20 07/15/2022 0952   ALT 9 07/15/2022 0952   ALKPHOS 41 07/15/2022 0952   BILITOT 0.5 07/15/2022 0952   GFRNONAA >60 07/15/2022 0952    No results found for: "SPEP", "UPEP"  Lab Results  Component Value Date   WBC 4.5 07/15/2022   NEUTROABS 3.0 07/15/2022   HGB 8.9 (L) 07/15/2022   HCT 28.9 (L) 07/15/2022   MCV 84.8 07/15/2022   PLT 261 07/15/2022      Chemistry      Component Value Date/Time   NA 136 07/15/2022 0952   K 3.4 (L) 07/15/2022 0952   CL 100 07/15/2022 0952   CO2 25 07/15/2022 0952   BUN 10 07/15/2022 0952   CREATININE 0.64 07/15/2022 0952      Component Value Date/Time   CALCIUM 8.7 (L) 07/15/2022 0952   ALKPHOS 41 07/15/2022 0952   AST 20 07/15/2022 0952   ALT 9 07/15/2022 0952   BILITOT 0.5 07/15/2022 0952       RADIOGRAPHIC STUDIES: I have personally reviewed the radiological images as listed and agreed with the findings in the report. No results found.

## 2022-07-20 ENCOUNTER — Telehealth: Payer: Self-pay | Admitting: *Deleted

## 2022-07-20 NOTE — Telephone Encounter (Signed)
Patient called into clinic with follow up questions from clinic visit last week. Patient requesting update on physical therapy evaluation. Patient was referred for home health PT and was declined by insurance company, patient was then set up with outpatient physical therapy but appointment is not available until March. Patient reports she is experiencing more weakness and fatigue and pain related to sciatic nerve pain. Nurse informed patient that as requested by Dr. Darrall Dears new referral had been placed to again request home health PT due to homebound status. Message sent to [facility] to follow up. Patient also report she continues to experience cough and congestion related to what she reports as bronchitis. Patient has not taken any antibiotics or treatment at this time. Patient states this has been going on for 2 weeks at this time. Nurse offered virtual visit with [facility] as it is very difficult for patient to come into clinic. Patient is agreeable. Nurse advised that she will reach out to [facility] team.  Spoke with Nira Conn and there is availability in [facility] for Billey Chang, NP to evaluate with virtual visit tomorrow. Will send schedule message.

## 2022-07-20 NOTE — Telephone Encounter (Signed)
Call placed to patient with update, message received from Floydene Flock that patient can receive home health PT at this time, they do have availability. Scheduling availability will depend on insurance authorization of visits. Patient expressed understanding. Also advised that scheduling message had been sent for patient to have virtual visit with Billey Chang, NP for follow up on ongoing cough. Patient verbalized understanding and is in agreement with plan.

## 2022-07-21 ENCOUNTER — Inpatient Hospital Stay: Payer: PPO | Attending: Obstetrics and Gynecology | Admitting: Hospice and Palliative Medicine

## 2022-07-21 DIAGNOSIS — J069 Acute upper respiratory infection, unspecified: Secondary | ICD-10-CM

## 2022-07-21 DIAGNOSIS — R531 Weakness: Secondary | ICD-10-CM | POA: Diagnosis not present

## 2022-07-21 MED ORDER — AZITHROMYCIN 250 MG PO TABS: ORAL_TABLET | ORAL | 0 refills | Status: DC

## 2022-07-21 NOTE — Progress Notes (Signed)
Virtual Visit via Video Note  I connected with [name] on 07/21/22 at  9:30 AM EST by a video enabled telemedicine application and verified that I am speaking with the correct person using two identifiers.  Location: Patient: Home Provider: Clinic   I discussed the limitations of evaluation and management by telemedicine and the availability of in person appointments. The patient expressed understanding and agreed to proceed.  History of Present Illness: Kathryn Maddox is a 72 y.o. female with multiple medical problems including morbid obesity, hypertension, hyperlipidemia, stage III endometrial cancer status post EBRT and vaginal brachytherapy.  Patient last received cycle 4 carbo/Taxol/Keytruda on 04/26/2022.  She had poor tolerance of chemotherapy with profound fatigue/weakness.  Palliative care was consulted to help address goals.    Observations/Objective: Patient seen virtually.  She reports 2 weeks of nonproductive cough and URI symptoms.  Worse fatigue.  Denies fever or chills.  No shortness of breath or wheezing.  She feels like the cough is not improving with OTC antitussives.  No GERD symptoms.  She reports poor appetite.  Patient has pain, chronic fatigue, and poor performance status.  Likely secondary to disease process.  She has been off of treatment now since November without any significant improvement.  Patient does not desire further cancer treatment, with the exception that she might be willing to try letrozole should her performance status improve.  I offered hospice but patient would prefer to try home health.  We discussed multiple options to manage her symptoms but she would prefer not to utilize medications for treatment of pain, fatigue, appetite, etc.  She is okay with trial of an antibiotic.  Assessment and Plan: URI -start Z-Pak  Fatigue/weakness -continue home health PT  Follow Up Instructions: Patient to see Dr. Darrall Dears next month, sooner if needed    I discussed the assessment and treatment plan with the patient. The patient was provided an opportunity to ask questions and all were answered. The patient agreed with the plan and demonstrated an understanding of the instructions.   The patient was advised to call back or seek an in-person evaluation if the symptoms worsen or if the condition fails to improve as anticipated.  I provided 15 minutes of non-face-to-face time during this encounter.   Irean Hong, NP

## 2022-07-22 DIAGNOSIS — I10 Essential (primary) hypertension: Secondary | ICD-10-CM | POA: Diagnosis not present

## 2022-07-22 DIAGNOSIS — Z9181 History of falling: Secondary | ICD-10-CM | POA: Diagnosis not present

## 2022-07-22 DIAGNOSIS — E785 Hyperlipidemia, unspecified: Secondary | ICD-10-CM | POA: Diagnosis not present

## 2022-07-22 DIAGNOSIS — C541 Malignant neoplasm of endometrium: Secondary | ICD-10-CM | POA: Diagnosis not present

## 2022-07-22 DIAGNOSIS — G63 Polyneuropathy in diseases classified elsewhere: Secondary | ICD-10-CM | POA: Diagnosis not present

## 2022-07-22 DIAGNOSIS — Z7985 Long-term (current) use of injectable non-insulin antidiabetic drugs: Secondary | ICD-10-CM | POA: Diagnosis not present

## 2022-07-22 DIAGNOSIS — Z6841 Body Mass Index (BMI) 40.0 and over, adult: Secondary | ICD-10-CM | POA: Diagnosis not present

## 2022-07-22 DIAGNOSIS — M5440 Lumbago with sciatica, unspecified side: Secondary | ICD-10-CM | POA: Diagnosis not present

## 2022-07-25 DIAGNOSIS — G4733 Obstructive sleep apnea (adult) (pediatric): Secondary | ICD-10-CM | POA: Diagnosis not present

## 2022-07-25 DIAGNOSIS — J9819 Other pulmonary collapse: Secondary | ICD-10-CM | POA: Diagnosis not present

## 2022-07-25 DIAGNOSIS — Z923 Personal history of irradiation: Secondary | ICD-10-CM | POA: Diagnosis not present

## 2022-07-25 DIAGNOSIS — M6281 Muscle weakness (generalized): Secondary | ICD-10-CM | POA: Diagnosis not present

## 2022-07-25 DIAGNOSIS — Z66 Do not resuscitate: Secondary | ICD-10-CM | POA: Diagnosis not present

## 2022-07-25 DIAGNOSIS — R5381 Other malaise: Secondary | ICD-10-CM | POA: Diagnosis not present

## 2022-07-25 DIAGNOSIS — G8929 Other chronic pain: Secondary | ICD-10-CM | POA: Diagnosis not present

## 2022-07-25 DIAGNOSIS — Z7401 Bed confinement status: Secondary | ICD-10-CM | POA: Diagnosis not present

## 2022-07-25 DIAGNOSIS — I10 Essential (primary) hypertension: Secondary | ICD-10-CM | POA: Diagnosis not present

## 2022-07-25 DIAGNOSIS — R54 Age-related physical debility: Secondary | ICD-10-CM | POA: Diagnosis not present

## 2022-07-25 DIAGNOSIS — R0902 Hypoxemia: Secondary | ICD-10-CM | POA: Diagnosis not present

## 2022-07-25 DIAGNOSIS — E119 Type 2 diabetes mellitus without complications: Secondary | ICD-10-CM | POA: Diagnosis not present

## 2022-07-25 DIAGNOSIS — R918 Other nonspecific abnormal finding of lung field: Secondary | ICD-10-CM | POA: Diagnosis not present

## 2022-07-25 DIAGNOSIS — M545 Low back pain, unspecified: Secondary | ICD-10-CM | POA: Diagnosis not present

## 2022-07-25 DIAGNOSIS — R0609 Other forms of dyspnea: Secondary | ICD-10-CM | POA: Diagnosis not present

## 2022-07-25 DIAGNOSIS — Z7409 Other reduced mobility: Secondary | ICD-10-CM | POA: Diagnosis not present

## 2022-07-25 DIAGNOSIS — C775 Secondary and unspecified malignant neoplasm of intrapelvic lymph nodes: Secondary | ICD-10-CM | POA: Diagnosis not present

## 2022-07-25 DIAGNOSIS — J910 Malignant pleural effusion: Secondary | ICD-10-CM | POA: Diagnosis not present

## 2022-07-25 DIAGNOSIS — Z48813 Encounter for surgical aftercare following surgery on the respiratory system: Secondary | ICD-10-CM | POA: Diagnosis not present

## 2022-07-25 DIAGNOSIS — Z6841 Body Mass Index (BMI) 40.0 and over, adult: Secondary | ICD-10-CM | POA: Diagnosis not present

## 2022-07-25 DIAGNOSIS — C541 Malignant neoplasm of endometrium: Secondary | ICD-10-CM | POA: Diagnosis not present

## 2022-07-25 DIAGNOSIS — C543 Malignant neoplasm of fundus uteri: Secondary | ICD-10-CM | POA: Diagnosis not present

## 2022-07-25 DIAGNOSIS — D649 Anemia, unspecified: Secondary | ICD-10-CM | POA: Diagnosis not present

## 2022-07-25 DIAGNOSIS — Z20822 Contact with and (suspected) exposure to covid-19: Secondary | ICD-10-CM | POA: Diagnosis not present

## 2022-07-25 DIAGNOSIS — Z4682 Encounter for fitting and adjustment of non-vascular catheter: Secondary | ICD-10-CM | POA: Diagnosis not present

## 2022-07-25 DIAGNOSIS — J90 Pleural effusion, not elsewhere classified: Secondary | ICD-10-CM | POA: Diagnosis not present

## 2022-07-25 DIAGNOSIS — J9811 Atelectasis: Secondary | ICD-10-CM | POA: Diagnosis not present

## 2022-07-25 DIAGNOSIS — Z79899 Other long term (current) drug therapy: Secondary | ICD-10-CM | POA: Diagnosis not present

## 2022-07-25 DIAGNOSIS — D509 Iron deficiency anemia, unspecified: Secondary | ICD-10-CM | POA: Diagnosis not present

## 2022-07-25 DIAGNOSIS — R0602 Shortness of breath: Secondary | ICD-10-CM | POA: Diagnosis not present

## 2022-07-25 DIAGNOSIS — Z9221 Personal history of antineoplastic chemotherapy: Secondary | ICD-10-CM | POA: Diagnosis not present

## 2022-07-25 DIAGNOSIS — R0600 Dyspnea, unspecified: Secondary | ICD-10-CM | POA: Diagnosis not present

## 2022-07-25 DIAGNOSIS — D638 Anemia in other chronic diseases classified elsewhere: Secondary | ICD-10-CM | POA: Diagnosis not present

## 2022-07-25 DIAGNOSIS — J918 Pleural effusion in other conditions classified elsewhere: Secondary | ICD-10-CM | POA: Diagnosis not present

## 2022-07-26 ENCOUNTER — Other Ambulatory Visit: Payer: Self-pay | Admitting: *Deleted

## 2022-07-26 ENCOUNTER — Telehealth: Payer: Self-pay | Admitting: Internal Medicine

## 2022-07-26 DIAGNOSIS — C541 Malignant neoplasm of endometrium: Secondary | ICD-10-CM

## 2022-07-26 NOTE — Telephone Encounter (Signed)
Spoke to Dr. Posey Pronto at [facility].   History of endometrial cancer-presented to [facility] with pleural effusion.  Based on imaging question malignancy.  S/p paracentesis- Cytology pending.  Patient may be discharged soon.  Patient needs follow-up in the clinic-next week-to follow-up on cytology/pain management APP-labs CBC CMP- Dr.B

## 2022-07-28 ENCOUNTER — Telehealth: Payer: Self-pay | Admitting: Nurse Practitioner

## 2022-07-28 NOTE — Telephone Encounter (Signed)
pt called in to have appt for 2/13 cancelled. Pt states that she is currently in the hospital and will be going to a facility afterwards. She would not like to r/s

## 2022-08-02 ENCOUNTER — Inpatient Hospital Stay: Payer: PPO

## 2022-08-02 ENCOUNTER — Inpatient Hospital Stay: Payer: PPO | Admitting: Nurse Practitioner

## 2022-08-03 DIAGNOSIS — Z6841 Body Mass Index (BMI) 40.0 and over, adult: Secondary | ICD-10-CM | POA: Diagnosis not present

## 2022-08-03 DIAGNOSIS — R0902 Hypoxemia: Secondary | ICD-10-CM | POA: Diagnosis not present

## 2022-08-03 DIAGNOSIS — J910 Malignant pleural effusion: Secondary | ICD-10-CM | POA: Diagnosis not present

## 2022-08-03 DIAGNOSIS — M6281 Muscle weakness (generalized): Secondary | ICD-10-CM | POA: Diagnosis not present

## 2022-08-03 DIAGNOSIS — Z7409 Other reduced mobility: Secondary | ICD-10-CM | POA: Diagnosis not present

## 2022-08-03 DIAGNOSIS — I10 Essential (primary) hypertension: Secondary | ICD-10-CM | POA: Diagnosis not present

## 2022-08-03 DIAGNOSIS — M545 Low back pain, unspecified: Secondary | ICD-10-CM | POA: Diagnosis not present

## 2022-08-03 DIAGNOSIS — J90 Pleural effusion, not elsewhere classified: Secondary | ICD-10-CM | POA: Diagnosis not present

## 2022-08-03 DIAGNOSIS — C543 Malignant neoplasm of fundus uteri: Secondary | ICD-10-CM | POA: Diagnosis not present

## 2022-08-03 DIAGNOSIS — C541 Malignant neoplasm of endometrium: Secondary | ICD-10-CM | POA: Diagnosis not present

## 2022-08-03 DIAGNOSIS — J918 Pleural effusion in other conditions classified elsewhere: Secondary | ICD-10-CM | POA: Diagnosis not present

## 2022-08-03 DIAGNOSIS — R5381 Other malaise: Secondary | ICD-10-CM | POA: Diagnosis not present

## 2022-08-03 DIAGNOSIS — G4733 Obstructive sleep apnea (adult) (pediatric): Secondary | ICD-10-CM | POA: Diagnosis not present

## 2022-08-03 DIAGNOSIS — D638 Anemia in other chronic diseases classified elsewhere: Secondary | ICD-10-CM | POA: Diagnosis not present

## 2022-08-03 DIAGNOSIS — R0602 Shortness of breath: Secondary | ICD-10-CM | POA: Diagnosis not present

## 2022-08-04 ENCOUNTER — Other Ambulatory Visit: Payer: PPO

## 2022-08-08 ENCOUNTER — Telehealth: Payer: Self-pay | Admitting: *Deleted

## 2022-08-08 NOTE — Telephone Encounter (Signed)
Call from [place] at [facility] stating patient discharged to facility from hospital then she left facility AMA and went back home and so they are asking for resumption of Physical Therapist care orders for her. Please advise

## 2022-08-09 ENCOUNTER — Other Ambulatory Visit: Payer: PPO

## 2022-08-09 ENCOUNTER — Encounter: Payer: Self-pay | Admitting: Internal Medicine

## 2022-08-09 ENCOUNTER — Other Ambulatory Visit: Payer: Self-pay | Admitting: *Deleted

## 2022-08-09 ENCOUNTER — Telehealth: Payer: Self-pay | Admitting: *Deleted

## 2022-08-09 DIAGNOSIS — Z515 Encounter for palliative care: Secondary | ICD-10-CM

## 2022-08-09 DIAGNOSIS — J90 Pleural effusion, not elsewhere classified: Secondary | ICD-10-CM

## 2022-08-09 DIAGNOSIS — C541 Malignant neoplasm of endometrium: Secondary | ICD-10-CM

## 2022-08-09 DIAGNOSIS — R531 Weakness: Secondary | ICD-10-CM

## 2022-08-09 NOTE — Progress Notes (Signed)
PATIENT NAME: Kathryn Maddox DOB: 1950-07-14 MRN: [id number]  PRIMARY CARE PROVIDER: Sofie Hartigan, MD  RESPONSIBLE PARTY:  Acct ID - Guarantor Home Phone Work Phone Relationship Acct Type  [id number] DANNIEL, [id number]  Self P/F     [address] [place], [place] [address]   I connected with  [name] on 08/09/22 by phone and verified that I am speaking with the correct person using two identifiers.   I discussed the limitations of evaluation and management by telemedicine. The patient expressed understanding and agreed to proceed.   Message received from [facility] that patient had left against medical advice last week.  Phone call made to patient to follow up on overall status, needs and goals of care at this point.   Patient shared her symptoms leading up to her hospitalization.  She was admitted to [facility] with large pleural effusion and had 2.3 L removed by thoracentesis.  Patient then had a pleurx drain placed.  Patient discharged from [facility] on 08/03/22. She went to [facility] for short term rehab but was unhappy at the facility, left AMA and returned home on 08/04/22.   Patient advised her neighbor who is a nurse came to her home on 08/06/22 and drained her pleurx drain and applied a new dressing.  She advised [facility] was involved in her care prior to her hospitalization and she has been attempting to restart services.  Patient was contacted by PT stating they needed a release form from the facility before they could start seeing her in the home.  Patient is also needing home health nursing to address her pleurx drain and dressing.  Advised patient that I would follow up with [facility] regarding needs and see what they are needing specifically to begin services again.    Goals of care were also discussed with patient.  She does not have any plans to pursue further treatment.  She has already declined further chemotherapy treatments.  Patient  would like to remain in her home with symptoms managed.  She continues to have lower back pain but is only taking prn tylenol.  She was previously taking dilaudid in the hospital but is not interested in taking anything stronger than tylenol at this time.  Given patient's overall condition and goals of care, I gently introduced hospice philosophy and support to patient.  Explained the differences between home health vs hospice support at patient request.  She is aware hospice support can be provided in the home.  Patient advised she is open to discussing hospice more but would like to have [facility] restart services and add nursing at this time.  Patient also discussed Palliative Care services and did not feel home visits were beneficial.   She advised she was aware of another Palliative Care service but they were doing the same service as [facility].  Patient advised social visits were not really needed and her goal was PT and nursing to address her pleurx drain.  I advised patient that I would follow up with [facility] and update her as I had new information.  I will revisit Palliative Care services at a later date with patient.  If she would like to discharge from services then we will proceed with discharge at her  request.   Phone call made to [facility] and spoke with Tiffany.  She advised they would be able to add nursing to patient's services but will need an order.  Secure message sent to Billey Chang, NP with update  on phone conversation.  Orders to be faxed to [facility] for home health nursing.     Lorenza Burton, RN

## 2022-08-09 NOTE — Telephone Encounter (Signed)
NCM Nou Yang with [employer] called with concerns with this patient and the fact that she has a pleur ex cath and it needing drained and dressing changes. She did have her neighbor who is a nurse to change her dressing after she got home, but has no home health or appointment with [place] to have it done. Please advise.  She is going to call [facility] to see if they are willing to send nursing to see patient for care and drainage and will let me know if they will be able to do this

## 2022-08-09 NOTE — Telephone Encounter (Signed)
Per Glennie Isle at [facility] agreed that they can send nsg out to do pleur ex draining and dressing change. Please send orders to [facility] for this asap

## 2022-08-10 DIAGNOSIS — E782 Mixed hyperlipidemia: Secondary | ICD-10-CM | POA: Diagnosis not present

## 2022-08-11 ENCOUNTER — Telehealth: Payer: Self-pay | Admitting: *Deleted

## 2022-08-11 ENCOUNTER — Inpatient Hospital Stay (HOSPITAL_BASED_OUTPATIENT_CLINIC_OR_DEPARTMENT_OTHER): Payer: PPO | Admitting: Hospice and Palliative Medicine

## 2022-08-11 DIAGNOSIS — J90 Pleural effusion, not elsewhere classified: Secondary | ICD-10-CM

## 2022-08-11 DIAGNOSIS — C541 Malignant neoplasm of endometrium: Secondary | ICD-10-CM

## 2022-08-11 NOTE — Telephone Encounter (Signed)
I called patient per v/o Josh to set up a mychart visit with Josh today at 215. Patient is agreeable to mychart apt with Josh. HH needs more specific orders for pleurx cath dressing changes/access. We can sent these orders to the [facility] RNs. Pt asked if she would get supplies delivered to her house. I explained to her that if [facility] was not involved in her care, supplies could not be ordered with [employer]. However, since [facility] will be servicing the patient, [facility] services will not ship supplies due to insurance as long as patient is being services by [facility]. She asked if I personally could come to her home to handle her pleurx. I explained to her that I do not work for [facility]. However, I am happy to answering any questions that she may have over the telephone regarding her plerux cath. I asked pt when she last drained. She stated that she drained on 17'th and feels like she need to drain again asap. I reiterated/educated pt that the max she can drain from pleurx in one day is a 1,000 ml bottle of pleural fluid. We discussed coordinating her drainage of pleurx cath or dressing changes around her bath time. She should change her pleurx cath dressing if the dressing becomes solid/wet and after each pleurx cath draining.  She gave verbal understanding of the plan and agreed with mychart visit with Merrily Pew, NP.

## 2022-08-11 NOTE — Progress Notes (Signed)
Virtual Visit via Video Note  I connected with Kathryn Maddox on 08/11/22 at  2:15 PM EST by a video enabled telemedicine application and verified that I am speaking with the correct person using two identifiers.  Location: Patient: Home Provider: Clinic   I discussed the limitations of evaluation and management by telemedicine and the availability of in person appointments. The patient expressed understanding and agreed to proceed.  History of Present Illness: Kathryn Maddox is a 72 y.o. female with multiple medical problems including morbid obesity, hypertension, hyperlipidemia, stage III endometrial cancer status post EBRT and vaginal brachytherapy.  Patient last received cycle 4 carbo/Taxol/Keytruda on 04/26/2022.  She had poor tolerance of chemotherapy with profound fatigue/weakness.  Palliative care was consulted to help address goals.     Observations/Objective: Patient was hospitalized at [facility] 07/25/2022 with shortness of breath and found to have a large malignant left pleural effusion for which he underwent Pleurx placement.  She was discharged to rehab but left a day later due to poor conditions at the facility.  Since returning home, patient has had difficulty establishing care with home health.  She has a Pleurx that requires draining but does not have supplies available to do so.  I had a virtual visit with patient today.  She verbalized understandable frustration with her recent care.  We discussed at length the differences between home health and hospice.  Patient stated that she would prefer to enroll in hospice and focus on comfort/quality of life at home.  Assessment and Plan: Endometrial cancer -poor performance status now with malignant pleural effusion.  Patient is not interested in additional cancer treatments.  Referral placed for hospice care.  Pleural effusion -status post Pleurx.  Okay to drain every 3 to 4 days with a max of 1 L or stop if patient experiences  discomfort.  Follow Up Instructions: Follow-up with Dr. Loni Muse on 3/19   I discussed the assessment and treatment plan with the patient. The patient was provided an opportunity to ask questions and all were answered. The patient agreed with the plan and demonstrated an understanding of the instructions.   The patient was advised to call back or seek an in-person evaluation if the symptoms worsen or if the condition fails to improve as anticipated.  I provided 15 minutes of non-face-to-face time during this encounter.   Irean Hong, NP

## 2022-08-14 DIAGNOSIS — G4733 Obstructive sleep apnea (adult) (pediatric): Secondary | ICD-10-CM | POA: Diagnosis not present

## 2022-08-17 ENCOUNTER — Ambulatory Visit: Payer: HMO

## 2022-08-17 ENCOUNTER — Encounter: Payer: Self-pay | Admitting: Internal Medicine

## 2022-08-17 DIAGNOSIS — E782 Mixed hyperlipidemia: Secondary | ICD-10-CM | POA: Diagnosis not present

## 2022-08-17 DIAGNOSIS — T466X5A Adverse effect of antihyperlipidemic and antiarteriosclerotic drugs, initial encounter: Secondary | ICD-10-CM | POA: Diagnosis not present

## 2022-08-17 DIAGNOSIS — E1142 Type 2 diabetes mellitus with diabetic polyneuropathy: Secondary | ICD-10-CM | POA: Diagnosis not present

## 2022-08-17 DIAGNOSIS — G4733 Obstructive sleep apnea (adult) (pediatric): Secondary | ICD-10-CM | POA: Diagnosis not present

## 2022-08-17 DIAGNOSIS — Z6841 Body Mass Index (BMI) 40.0 and over, adult: Secondary | ICD-10-CM | POA: Diagnosis not present

## 2022-08-17 DIAGNOSIS — M5432 Sciatica, left side: Secondary | ICD-10-CM | POA: Diagnosis not present

## 2022-08-17 DIAGNOSIS — B369 Superficial mycosis, unspecified: Secondary | ICD-10-CM | POA: Diagnosis not present

## 2022-08-17 DIAGNOSIS — C541 Malignant neoplasm of endometrium: Secondary | ICD-10-CM | POA: Diagnosis not present

## 2022-08-17 DIAGNOSIS — D6489 Other specified anemias: Secondary | ICD-10-CM | POA: Diagnosis not present

## 2022-08-17 DIAGNOSIS — I10 Essential (primary) hypertension: Secondary | ICD-10-CM | POA: Diagnosis not present

## 2022-08-17 DIAGNOSIS — M791 Myalgia, unspecified site: Secondary | ICD-10-CM | POA: Diagnosis not present

## 2022-08-22 DIAGNOSIS — I10 Essential (primary) hypertension: Secondary | ICD-10-CM | POA: Diagnosis not present

## 2022-08-22 DIAGNOSIS — E782 Mixed hyperlipidemia: Secondary | ICD-10-CM | POA: Diagnosis not present

## 2022-08-22 DIAGNOSIS — E1142 Type 2 diabetes mellitus with diabetic polyneuropathy: Secondary | ICD-10-CM | POA: Diagnosis not present

## 2022-08-23 ENCOUNTER — Other Ambulatory Visit: Payer: Self-pay | Admitting: Hospice and Palliative Medicine

## 2022-08-23 MED ORDER — MORPHINE SULFATE (CONCENTRATE) 10 MG /0.5 ML PO SOLN: 5.0000 mg | ORAL | 0 refills | Status: DC | PRN

## 2022-08-23 NOTE — Progress Notes (Signed)
Received a call from hospice nurse, Mitzie, that patient is having worse pain. Patient would like to start morphine elixir. Rx sent to pharmacy.

## 2022-08-24 ENCOUNTER — Encounter: Payer: Self-pay | Admitting: Physical Therapy

## 2022-08-24 ENCOUNTER — Ambulatory Visit: Attending: Hospice and Palliative Medicine | Admitting: Physical Therapy

## 2022-08-24 DIAGNOSIS — M6281 Muscle weakness (generalized): Secondary | ICD-10-CM | POA: Insufficient documentation

## 2022-08-24 DIAGNOSIS — M5459 Other low back pain: Secondary | ICD-10-CM | POA: Insufficient documentation

## 2022-08-24 DIAGNOSIS — C541 Malignant neoplasm of endometrium: Secondary | ICD-10-CM | POA: Diagnosis present

## 2022-08-24 NOTE — Therapy (Unsigned)
OUTPATIENT PHYSICAL THERAPY EVALUATION   Patient Name: Kathryn Maddox MRN: [id number] DOB:Feb 02, 1951, 72 y.o., female Today's Date: 08/25/2022   PT End of Session - 08/24/22 1338     Visit Number 1    Number of Visits 10    Date for PT Re-Evaluation 11/02/22    PT Start Time 1340    PT Stop Time [id number]    PT Time Calculation (min) 38 min    Activity Tolerance Patient tolerated treatment well    Behavior During Therapy [facility] for tasks assessed/performed             Past Medical History:  Diagnosis Date   Carpal tunnel syndrome on both sides 03/2018   Cellulitis and abscess of lower extremity 03/2018   bilateral lower extrems, treated with antibx x 10 days   Diabetes mellitus without complication ([place])    Dyspnea 03/2018   with any exercise, due to weight   Eczema    Endometrial cancer ([place]) 08/26/2020   Hyperlipidemia    Hypertension    Morbid obesity with BMI of 70 and over, adult ([place]) 03/2018   Sleep apnea 03/2018   uses a cpap   Past Surgical History:  Procedure Laterality Date   CARPAL TUNNEL RELEASE Left 04/12/2018   Procedure: CARPAL TUNNEL RELEASE;  Surgeon: Hessie Knows, MD;  Location: [facility] ORS;  Service: Orthopedics;  Laterality: Left;   COLONOSCOPY WITH PROPOFOL N/A 12/02/2019   Procedure: COLONOSCOPY WITH PROPOFOL;  Surgeon: Toledo, Benay Pike, MD;  Location: [facility] ENDOSCOPY;  Service: Gastroenterology;  Laterality: N/A;   DILATION AND CURETTAGE OF UTERUS  03/2018   has had 10 d & c's with hysteroscopy   HERNIA REPAIR  [id number]   umbilical w/ obstructed bowel   IR IMAGING GUIDED PORT INSERTION  02/17/2022   LIPOMA EXCISION Right 1997   shoulder   obstructive bowel surgery  01/2010   Patient Active Problem List   Diagnosis Date Noted   Hypomagnesemia 06/21/2022   Weakness generalized 05/18/2022   Poor fluid intake 05/04/2022   Goals of care, counseling/discussion 05/04/2022   Essential hypertension 09/08/2021   OSA (obstructive sleep apnea) 09/08/2021    Endometrial cancer ([place]) 08/26/2020   Diabetic peripheral neuropathy ([place]) 12/06/2017   Obesity, morbid (more than 100 lbs over ideal weight or BMI > 40) ([place]) 12/06/2017   Type 2 diabetes mellitus, without long-term current use of insulin ([place]) 12/06/2017    PCP:   REFERRING PROVIDER: [facility]   REFERRING DIAG:C54.1 (ICD-10-CM) - Endometrial cancer ([place])   Rationale for Evaluation and Treatment Rehabilitation  THERAPY DIAG:  Endometrial cancer ([place])  Other low back pain  Muscle weakness (generalized)  ONSET DATE:   SUBJECTIVE:  SUBJECTIVE STATEMENT: 1) Currently with sciatic pain on L that started a few weeks ago. It interrupts sleep. 7/10. Worsening with movement.  Pt sits a lot.   Pt is weak and very anemic. Relieved when lying on her back. BUt the pain starts again once she gets up.    2) Pt also has pain with breathing and the front and back region by the ribs which started since she was in the hospital.   Pt will be getting home PT as of next week.    PERTINENT HISTORY:  Jul 25 2022 pt was in the hospital for 10 days to get fluids pulled from the L lungs. On the 8th, a tube was placed in her back to drain the fluids. A nurse will come to drain it if necessary weekly.  Pt has endometrial cancer and in hospice.  Previously received radiation, brachy, chemo.  Currently, not undergoing another more Tx.   PAIN:  Are you having pain? Yes:  PRECAUTIONS: None  WEIGHT BEARING RESTRICTIONS: No  FALLS:  Has patient fallen in last 6 months? No  LIVING ENVIRONMENT: Lives with: lives alone Lives in: House/apartment Stairs: no  Has following equipment at home: Rolling walker , rollator   OCCUPATION:     PLOF: Independent    PATIENT GOALS:   Out of pain and be able to move     OBJECTIVE:    [facility] PT Assessment       Precautions   Precaution Comments weakness, relies on RW, anemic,      Coordination   Coordination and Movement Description chest breathing, breathholding with shifting and moving legs in seated position      Palpation   SI assessment  tenderness at L PSIS, increased mm tensions along L paraspinals , flinching tenderness, pain with leaning forward/ back in WC             [facility] Adult PT Treatment/Exercise -       Therapeutic Activites    Other Therapeutic Activities explained d/c from [place] since pt will be receiving home PT due to weakness      Neuro Re-ed    Neuro Re-ed Details  cued for L trunk rotation to minimzie L paraspinal mm tightness , cued for lifting L leg with exhalation and stability with UE to minimize straining/ pain               HOME EXERCISE PROGRAM: See pt instruction section    ASSESSMENT:  CLINICAL IMPRESSION:  Pt is a 72 yo who presents endometrial cancer with L sciatic pain and pain with breathing and the front and back region by the ribs. Pt demo'd increased mm tensions along L back mm, breathing, straining with position shifts in [facility] which pt arrived in. Pt is dependent on walker at home due to weakness.  Pt was educated on coordination with breathing to optimize diaphragm and coordination deep core activation to minimize exertion with leg movements and minimize straining / breathholding. Pt was educated on breathing technique and trunk rotation to L to minimize L paraspinal mm tensions.    Pt receiving hospice care now after trying [facility] Tx.  Pt lives alone. Pt reported she will be getting home PT. Explained d/c today from [facility] due to pt 's pending home PT.  Therapist plans to research oncologist massage therapist and plan to communicate with pt with referral as pt requested massage therapy for pain relief. Pt also stated she is started liquid morphine soon.  Pt is d/c today.    OBJECTIVE IMPAIRMENTS  decreased activity tolerance, decreased coordination, decreased endurance, decreased mobility, difficulty walking, decreased ROM, decreased strength, decreased safety awareness, hypomobility, increased muscle spasms, impaired flexibility, improper body mechanics, postural dysfunction, and pain.    ACTIVITY LIMITATIONS  self-care,  sleep, home chores, work tasks    PARTICIPATION LIMITATIONS:  community   PERSONAL FACTORS   Active CA and selected for hospice   are also affecting patient's functional outcome.    REHAB POTENTIAL: Good   CLINICAL DECISION MAKING: Evolving/moderate complexity   EVALUATION COMPLEXITY: Moderate    PATIENT EDUCATION:    Education details: Showed pt anatomy images. Explained muscles attachments/ connection, physiology of deep core system/ spinal- thoracic-pelvis-lower kinetic chain as they relate to pt's presentation, Sx, and past Hx. Explained what and how these areas of deficits need to be restored to balance and function    See Therapeutic activity / neuromuscular re-education section  Answered pt's questions.   Person educated: Patient Education method: Explanation, Demonstration, Tactile cues, Verbal cues, and Handouts Education comprehension: verbalized understanding, returned demonstration, verbal cues required, tactile cues required, and needs further education     PLAN: PT FREQUENCY: 1 time visit   PT DURATION:    PLANNED INTERVENTIONS: Therapeutic exercises, Therapeutic activity, Neuromuscular re-education, Balance training, Gait training, Patient/Family education, Self Care, Joint mobilization, Spinal mobilization, Moist heat, Taping, and Manual therapy, dry needling.   PLAN FOR NEXT SESSION: See clinical impression for plan     GOALS: Goals reviewed with patient? Yes  SHORT TERM GOALS: Target date: 08/25/22  Pt will demo coordination with breath to move leg and for back stretch to minimize breathholding/ straining                      Baseline: Not IND            Goal status: MET       Jerl Mina, PT 08/25/2022, 2:58 PM

## 2022-08-25 NOTE — Patient Instructions (Signed)
Inhale softly Rise tall  Exhale, turn navel, chest, head to L   __   Transfers with lifting leg, use arms for stability, inhale before moving, exhale to lift L leg

## 2022-08-31 ENCOUNTER — Ambulatory Visit: Admitting: Physical Therapy

## 2022-08-31 ENCOUNTER — Telehealth: Payer: Self-pay | Admitting: Physical Therapy

## 2022-08-31 NOTE — Telephone Encounter (Signed)
Physical therapist emailed and phoned pt to let her know about the information pt requested for massage therapy services.   [email or web address] [phone]  Pt replied she is grateful. Pt also voiced she has not heard back from home health PT yet and therapist advised her to f/u with RN or MD.

## 2022-09-02 ENCOUNTER — Encounter: Payer: Self-pay | Admitting: Internal Medicine

## 2022-09-02 DIAGNOSIS — C541 Malignant neoplasm of endometrium: Secondary | ICD-10-CM | POA: Diagnosis not present

## 2022-09-06 ENCOUNTER — Ambulatory Visit: Payer: Medicaid Other | Admitting: Internal Medicine

## 2022-09-06 ENCOUNTER — Telehealth: Payer: Self-pay | Admitting: Hospice and Palliative Medicine

## 2022-09-06 ENCOUNTER — Other Ambulatory Visit: Payer: Medicaid Other

## 2022-09-06 NOTE — Telephone Encounter (Signed)
FYI, I was notified by hospice nurse, Mitizi, that patient revoked hospice services today to pursue second opinion for cancer treatment.

## 2022-09-07 ENCOUNTER — Ambulatory Visit: Admitting: Physical Therapy

## 2022-09-07 DIAGNOSIS — C541 Malignant neoplasm of endometrium: Secondary | ICD-10-CM | POA: Diagnosis not present

## 2022-09-08 ENCOUNTER — Ambulatory Visit: Payer: No Typology Code available for payment source | Admitting: Psychologist

## 2022-09-09 ENCOUNTER — Ambulatory Visit: Payer: Medicaid Other | Admitting: Internal Medicine

## 2022-09-09 ENCOUNTER — Other Ambulatory Visit: Payer: Medicaid Other

## 2022-09-12 DIAGNOSIS — G4733 Obstructive sleep apnea (adult) (pediatric): Secondary | ICD-10-CM | POA: Diagnosis not present

## 2022-09-14 ENCOUNTER — Ambulatory Visit: Admitting: Physical Therapy

## 2022-09-21 ENCOUNTER — Ambulatory Visit: Payer: PPO | Admitting: Physical Therapy

## 2022-09-22 ENCOUNTER — Ambulatory Visit: Payer: HMO | Admitting: Radiation Oncology

## 2022-09-28 ENCOUNTER — Ambulatory Visit: Payer: PPO | Admitting: Physical Therapy

## 2022-10-02 ENCOUNTER — Encounter: Payer: Self-pay | Admitting: Internal Medicine

## 2022-10-02 ENCOUNTER — Other Ambulatory Visit: Payer: Self-pay

## 2022-10-02 ENCOUNTER — Inpatient Hospital Stay
Admission: EM | Admit: 2022-10-02 | Discharge: 2022-10-07 | DRG: 812 | Disposition: A | Discharge status: Hospice | Attending: Internal Medicine | Admitting: Internal Medicine

## 2022-10-02 DIAGNOSIS — G8929 Other chronic pain: Secondary | ICD-10-CM

## 2022-10-02 DIAGNOSIS — Z66 Do not resuscitate: Secondary | ICD-10-CM | POA: Diagnosis present

## 2022-10-02 DIAGNOSIS — D649 Anemia, unspecified: Secondary | ICD-10-CM | POA: Diagnosis not present

## 2022-10-02 DIAGNOSIS — J910 Malignant pleural effusion: Secondary | ICD-10-CM | POA: Diagnosis present

## 2022-10-02 DIAGNOSIS — M545 Low back pain, unspecified: Secondary | ICD-10-CM | POA: Diagnosis not present

## 2022-10-02 DIAGNOSIS — R600 Localized edema: Secondary | ICD-10-CM | POA: Diagnosis not present

## 2022-10-02 DIAGNOSIS — R531 Weakness: Secondary | ICD-10-CM

## 2022-10-02 DIAGNOSIS — Z880 Allergy status to penicillin: Secondary | ICD-10-CM | POA: Diagnosis not present

## 2022-10-02 DIAGNOSIS — Z6841 Body Mass Index (BMI) 40.0 and over, adult: Secondary | ICD-10-CM

## 2022-10-02 DIAGNOSIS — E8809 Other disorders of plasma-protein metabolism, not elsewhere classified: Secondary | ICD-10-CM | POA: Diagnosis not present

## 2022-10-02 DIAGNOSIS — E119 Type 2 diabetes mellitus without complications: Secondary | ICD-10-CM | POA: Diagnosis present

## 2022-10-02 DIAGNOSIS — E785 Hyperlipidemia, unspecified: Secondary | ICD-10-CM | POA: Diagnosis present

## 2022-10-02 DIAGNOSIS — D509 Iron deficiency anemia, unspecified: Secondary | ICD-10-CM | POA: Diagnosis present

## 2022-10-02 DIAGNOSIS — Z7189 Other specified counseling: Secondary | ICD-10-CM | POA: Diagnosis not present

## 2022-10-02 DIAGNOSIS — C782 Secondary malignant neoplasm of pleura: Secondary | ICD-10-CM | POA: Diagnosis not present

## 2022-10-02 DIAGNOSIS — C541 Malignant neoplasm of endometrium: Secondary | ICD-10-CM | POA: Diagnosis not present

## 2022-10-02 DIAGNOSIS — E871 Hypo-osmolality and hyponatremia: Secondary | ICD-10-CM | POA: Diagnosis not present

## 2022-10-02 DIAGNOSIS — Z7985 Long-term (current) use of injectable non-insulin antidiabetic drugs: Secondary | ICD-10-CM | POA: Diagnosis not present

## 2022-10-02 DIAGNOSIS — Z9221 Personal history of antineoplastic chemotherapy: Secondary | ICD-10-CM

## 2022-10-02 DIAGNOSIS — Z888 Allergy status to other drugs, medicaments and biological substances status: Secondary | ICD-10-CM | POA: Diagnosis not present

## 2022-10-02 DIAGNOSIS — G4733 Obstructive sleep apnea (adult) (pediatric): Secondary | ICD-10-CM | POA: Diagnosis present

## 2022-10-02 DIAGNOSIS — M549 Dorsalgia, unspecified: Secondary | ICD-10-CM

## 2022-10-02 DIAGNOSIS — Z886 Allergy status to analgesic agent status: Secondary | ICD-10-CM

## 2022-10-02 DIAGNOSIS — Z515 Encounter for palliative care: Secondary | ICD-10-CM | POA: Diagnosis not present

## 2022-10-02 DIAGNOSIS — R079 Chest pain, unspecified: Secondary | ICD-10-CM | POA: Diagnosis not present

## 2022-10-02 DIAGNOSIS — I10 Essential (primary) hypertension: Secondary | ICD-10-CM | POA: Diagnosis present

## 2022-10-02 DIAGNOSIS — R54 Age-related physical debility: Secondary | ICD-10-CM | POA: Diagnosis present

## 2022-10-02 DIAGNOSIS — R609 Edema, unspecified: Secondary | ICD-10-CM | POA: Diagnosis not present

## 2022-10-02 DIAGNOSIS — D62 Acute posthemorrhagic anemia: Secondary | ICD-10-CM | POA: Diagnosis not present

## 2022-10-02 DIAGNOSIS — Z79899 Other long term (current) drug therapy: Secondary | ICD-10-CM

## 2022-10-02 LAB — CBC
HCT: 23.6 % — ABNORMAL LOW (ref 36.0–46.0)
Hemoglobin: 7.3 g/dL — ABNORMAL LOW (ref 12.0–15.0)
MCH: 23.5 pg — ABNORMAL LOW (ref 26.0–34.0)
MCHC: 30.9 g/dL (ref 30.0–36.0)
MCV: 76.1 fL — ABNORMAL LOW (ref 80.0–100.0)
Platelets: 498 10*3/uL — ABNORMAL HIGH (ref 150–400)
RBC: 3.1 MIL/uL — ABNORMAL LOW (ref 3.87–5.11)
RDW: 18.3 % — ABNORMAL HIGH (ref 11.5–15.5)
WBC: 9.4 10*3/uL (ref 4.0–10.5)
nRBC: 0 % (ref 0.0–0.2)

## 2022-10-02 LAB — CBC WITH DIFFERENTIAL/PLATELET
Abs Immature Granulocytes: 0.07 10*3/uL (ref 0.00–0.07)
Basophils Absolute: 0 10*3/uL (ref 0.0–0.1)
Basophils Relative: 0 %
Eosinophils Absolute: 0 10*3/uL (ref 0.0–0.5)
Eosinophils Relative: 0 %
HCT: 21.8 % — ABNORMAL LOW (ref 36.0–46.0)
Hemoglobin: 6.5 g/dL — ABNORMAL LOW (ref 12.0–15.0)
Immature Granulocytes: 1 %
Lymphocytes Relative: 10 %
Lymphs Abs: 1 10*3/uL (ref 0.7–4.0)
MCH: 22.6 pg — ABNORMAL LOW (ref 26.0–34.0)
MCHC: 29.8 g/dL — ABNORMAL LOW (ref 30.0–36.0)
MCV: 75.7 fL — ABNORMAL LOW (ref 80.0–100.0)
Monocytes Absolute: 1.4 10*3/uL — ABNORMAL HIGH (ref 0.1–1.0)
Monocytes Relative: 15 %
Neutro Abs: 7.3 10*3/uL (ref 1.7–7.7)
Neutrophils Relative %: 74 %
Platelets: 570 10*3/uL — ABNORMAL HIGH (ref 150–400)
RBC: 2.88 MIL/uL — ABNORMAL LOW (ref 3.87–5.11)
RDW: 18.7 % — ABNORMAL HIGH (ref 11.5–15.5)
WBC: 9.8 10*3/uL (ref 4.0–10.5)
nRBC: 0 % (ref 0.0–0.2)

## 2022-10-02 LAB — COMPREHENSIVE METABOLIC PANEL
ALT: 8 U/L (ref 0–44)
AST: 21 U/L (ref 15–41)
Albumin: 2.2 g/dL — ABNORMAL LOW (ref 3.5–5.0)
Alkaline Phosphatase: 51 U/L (ref 38–126)
Anion gap: 12 (ref 5–15)
BUN: 22 mg/dL (ref 8–23)
CO2: 23 mmol/L (ref 22–32)
Calcium: 8.1 mg/dL — ABNORMAL LOW (ref 8.9–10.3)
Chloride: 94 mmol/L — ABNORMAL LOW (ref 98–111)
Creatinine, Ser: 0.66 mg/dL (ref 0.44–1.00)
GFR, Estimated: >60 mL/min (ref >60)
Glucose, Bld: 106 mg/dL — ABNORMAL HIGH (ref 70–99)
Potassium: 4 mmol/L (ref 3.5–5.1)
Sodium: 129 mmol/L — ABNORMAL LOW (ref 135–145)
Total Bilirubin: 0.7 mg/dL (ref 0.3–1.2)
Total Protein: 6.2 g/dL — ABNORMAL LOW (ref 6.5–8.1)

## 2022-10-02 LAB — PREPARE RBC (CROSSMATCH)

## 2022-10-02 LAB — IRON AND TIBC
Iron: 16 ug/dL — ABNORMAL LOW (ref 28–170)
Saturation Ratios: 11 % (ref 10.4–31.8)
TIBC: 153 ug/dL — ABNORMAL LOW (ref 250–450)
UIBC: 137 ug/dL

## 2022-10-02 LAB — BPAM RBC: Blood Product Expiration Date: 202404172359

## 2022-10-02 LAB — ABO/RH: ABO/RH(D): O NEG

## 2022-10-02 LAB — GLUCOSE, CAPILLARY
Glucose-Capillary: 91 mg/dL (ref 70–99)
Glucose-Capillary: 92 mg/dL (ref 70–99)

## 2022-10-02 MED ORDER — FUROSEMIDE 10 MG/ML IJ SOLN
40.0000 mg | Freq: Once | INTRAMUSCULAR | Status: DC
  Filled 2022-10-02 (×2): qty 4

## 2022-10-02 MED ORDER — ACETAMINOPHEN 500 MG PO TABS
1000.0000 mg | ORAL_TABLET | Freq: Once | ORAL | Status: AC
  Administered 2022-10-02: 1000 mg via ORAL
  Filled 2022-10-02: qty 2

## 2022-10-02 MED ORDER — SODIUM CHLORIDE 0.9 % IV SOLN: 10.0000 mL/h | Freq: Once | INTRAVENOUS | Status: DC

## 2022-10-02 MED ORDER — HYDROMORPHONE HCL 1 MG/ML IJ SOLN
1.0000 mg | INTRAMUSCULAR | Status: DC | PRN
  Administered 2022-10-02 – 2022-10-07 (×26): 1 mg via INTRAVENOUS
  Filled 2022-10-02 (×27): qty 1

## 2022-10-02 MED ORDER — ONDANSETRON HCL 4 MG PO TABS: 4.0000 mg | ORAL_TABLET | Freq: Four times a day (QID) | ORAL | Status: DC | PRN

## 2022-10-02 MED ORDER — INSULIN ASPART 100 UNIT/ML IJ SOLN: 0.0000 [IU] | Freq: Three times a day (TID) | INTRAMUSCULAR | Status: DC

## 2022-10-02 MED ORDER — ONDANSETRON HCL 4 MG/2ML IJ SOLN: 4.0000 mg | Freq: Four times a day (QID) | INTRAMUSCULAR | Status: DC | PRN

## 2022-10-02 MED ORDER — ACETAMINOPHEN 500 MG PO TABS
1000.0000 mg | ORAL_TABLET | Freq: Four times a day (QID) | ORAL | Status: DC
  Administered 2022-10-03 – 2022-10-07 (×11): 1000 mg via ORAL
  Filled 2022-10-02 (×14): qty 2

## 2022-10-02 MED ORDER — SODIUM CHLORIDE 0.9% FLUSH
3.0000 mL | Freq: Two times a day (BID) | INTRAVENOUS | Status: DC
  Administered 2022-10-02 – 2022-10-07 (×10): 3 mL via INTRAVENOUS

## 2022-10-02 NOTE — Assessment & Plan Note (Signed)
-   Hold home antiglycemic agents - SSI

## 2022-10-02 NOTE — ED Notes (Signed)
Advised nurse that patient has ready bed 

## 2022-10-02 NOTE — ED Notes (Signed)
Called the floor to ask them to turn the purple man green.

## 2022-10-02 NOTE — Assessment & Plan Note (Signed)
-   Schedule Tylenol 1000 mg every 6 hours - Dilaudid for breakthrough

## 2022-10-02 NOTE — Assessment & Plan Note (Signed)
Patient is presenting with gradually worsening generalized weakness that has significantly worsened since experiencing heavy vaginal bleeding.  History of chronic anemia with acute worsening with hemoglobin of 6.5 at this time.  Previously 8.0 approximately 3 weeks ago.  Patient states her goal is to improve her strength to be able to go home with home hospice.  She consents to blood transfusion.  In addition, if indicated, she consents to IV iron as well.  - 1 unit of packed RBC ordered - Posttransfusion CBC - Iron and TIBC pending

## 2022-10-02 NOTE — Assessment & Plan Note (Addendum)
Multifactorial in the setting of endometrial cancer with metastasis, poor nutrition, acute on chronic anemia, and deconditioning.  Patient is currently followed by hospice, however I am concerned she may need a higher level of care without some improvement in her weakness.  - Palliative consulted to assist with goals of care and appropriate discharge planning - PT/OT - Consider appetite stimulant

## 2022-10-02 NOTE — Assessment & Plan Note (Signed)
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  Pleurx catheter in place with no requirement for drainage at this time

## 2022-10-02 NOTE — ED Triage Notes (Signed)
Pt here via [facility] with increased weakness and SOB x 4 days. Pt states she is a cancer pt and is currently receiving tx. Pt also is on hospice and states a nurse comes out to see her but does not do anything. Pt c/o chest and back pain.

## 2022-10-02 NOTE — Assessment & Plan Note (Addendum)
Patient with bilateral lower extremity edema, likely in the setting of hypoalbuminemia.  Received one-time dose of Lasix in the ED.  -No additional doses of Lasix ordered at this time

## 2022-10-02 NOTE — Consult Note (Signed)
Consultation Note Date: 10/02/2022   Patient Name: Kathryn Maddox  DOB: 1951/02/21  MRN: [id number]  Age / Sex: 72 y.o., female  PCP: Kathryn Goodell, MD Referring Physician: Verdene Lennert, MD  Reason for Consultation: Establishing goals of care   HPI/Brief Hospital Course: 72 y.o. female  with past medical history of morbid obesity, HTN, HLD, stage 3 endometrial cancer s/p EBRT and vaginal brachytherapy. Last received chemotherapy treatment on 04/26/2022. She had intolerance to treatment with profound fatigue/weakness. Recent hospitalization at [facility] (07/2022) with shortness of breath and found to have a large malignant left pleural effusion for which she underwent Pleurx placement. Recently after this, Kathryn Maddox decided to pursue Hospice care and received services in her home. She is currently enrolled in Home Hospice with [employer]. She arrives to ED from home with significant weakness and increased SHOB x 3-4 days. Kathryn Maddox shares that over the past week or so she has had quite a few episode of severe vaginal bleeding and feels her profound weakness is likely associated to blood loss. Shares that she did contact Hospice provider who recommended she come to ED for evaluation.  Work-up revealed Hgb 6.5 Platelets 570  She consented to blood transfusion and currently receiving 1 unit PRBC in ED  Palliative medicine was consulted for assisting with goals of care conversations.  Subjective:  Extensive chart review has been completed prior to meeting patient including labs, vital signs, imaging, progress notes, orders, and available advanced directive documents from current and previous encounters.  Introduced myself as a Publishing rights manager as a member of the palliative care team. Explained palliative medicine is specialized medical care for people living with serious illness. It focuses on providing relief from the symptoms and stress of a  serious illness. The goal is to improve quality of life for both the patient and the family.   Visited with Kathryn Maddox at her bedside. Close friend-Kathryn Maddox at bedside during time of visit. Kathryn Maddox shares her familiarity with Palliative Care and Hospice services. Confirms she is an active hospice patient with [employer].  Kathryn Maddox and Kathryn Maddox both provide history obtained. Kathryn Maddox is not married and does not have children. She does not have any family close to her. She has 2 close friends that help her as much as they are able. She lives alone but has hospice coming into her home as well as a "house aid." Kathryn Maddox shares that over the last several weeks, Kathryn Maddox has had profound weakness and she has not been an active participant in her care. Has required total assistance with transfers and all ADL's. Appetite has also been poor with minimal PO intake. Concerns are shared for her ability to safely return home.  Kathryn Maddox confirms she wishes to remain under hospice services. Shared a member from [facility] will likely follow along with her during her hospitalization.  Confirmed DNR Focus is to remain comfort but agrees to lab drawn, vital sign monitoring, IV fluids and blood transfusions Declines imaging, surgical intervention or other invasive intervention  Mr. Kathryn Maddox shares her hope is to gain some strength from receiving a blood transfusion in order to be able to safely return home. We discuss expected disease trajectory of advanced cancer and likelihood of bleeding/anemia recurrence. Verbalizes understanding and again shares her concern of her ability to remain at home. Likely a disposition issue.  I have informed Kathryn Maddox with [facility] of Kathryn Maddox's admittance to the hospital.  All questions/concerns addressed. Emotional support provided to  patient/family/support persons. PMT will continue to follow and support patient as needed.  Objective: Primary Diagnoses: Present on  Admission:  Symptomatic anemia  Endometrial cancer  OSA (obstructive sleep apnea)   Vital Signs: BP (!) 91/55   Pulse 73   Temp (!) 97.4 F (36.3 C) (Oral)   Resp 17   Ht  (1.549 m)   Wt (!) 138.1 kg   SpO2 97%   BMI 57.53 kg/m  Pain Scale: 0-10   Pain Score: 4     Assessment and Plan  SUMMARY OF RECOMMENDATIONS   DNR/DNI Comfort measures but agrees to blood transfusion, lab draws, vital sign monitoring and IV fluids [facility] (hospice) aware of admission to hospital with plans to assess and follow through admission Predict disposition issue as returning home at this time does not seem appropriate PMT to continue to follow for ongoing needs and support   Thank you for this consult and allowing Palliative Medicine to participate in the care of Kathryn Maddox. Palliative medicine will continue to follow and assist as needed.   Time Total: 55 minutes  Time spent includes: Detailed review of medical records (labs, imaging, vital signs), medically appropriate exam (mental status, respiratory, cardiac, skin), discussed with treatment team, counseling and educating patient, family and staff, documenting clinical information, medication management and coordination of care.   Signed by: Kathryn Deed, DNP, AGNP-C Palliative Medicine    Please contact Palliative Medicine Team phone at [phone] for questions and concerns.  For individual provider: See Loretha Stapler

## 2022-10-02 NOTE — ED Provider Notes (Signed)
[facility] Provider Note    Event Date/Time   First MD Initiated Contact with Patient 10/02/22 1133     (approximate)   History   Chief Complaint Weakness   HPI  Kathryn Maddox is a 72 y.o. female with past medical history of hypertension, diabetes, OSA, endometrial cancer, and pleural effusion who presents to the ED complaining of weakness.  Patient reports that over the past month she has been feeling increasingly weak and malaised.  She states that it has gotten to the point where she cannot get up out of bed, requires around-the-clock care by her neighbor who is at bedside.  She has been unable to walk for at least the past couple of weeks, denies any recent falls.  She has had ongoing vaginal bleeding related to known endometrial cancer, reports heavy bleeding 2 days ago that has since improved, does not take a blood thinner.  She currently follows with hospice care, was offered hormonal treatment by [facility] oncology but declined as she wanted to continue with hospice.  She is concerned her blood counts are low contributing to her weakness, is also concerned about lower extremity swelling and abdominal distention.  She has chronic pain in her abdomen that is unchanged, denies any pain in her chest or difficulty breathing.  She has not had any fevers or dysuria.  She has been able to eat or drink very little over the past couple of weeks.     Physical Exam   Triage Vital Signs: ED Triage Vitals  Enc Vitals Group     BP 10/02/22 1138 (!) 149/73     Pulse Rate 10/02/22 1138 93     Resp 10/02/22 1138 16     Temp 10/02/22 1138 97.7 F (36.5 C)     Temp Source 10/02/22 1138 Oral     SpO2 10/02/22 1138 97 %     Weight 10/02/22 1134 (!) 304 lb 7.3 oz (138.1 kg)     Height 10/02/22 1134 5\' 1"  (1.549 m)     Head Circumference --      Peak Flow --      Pain Score 10/02/22 1134 5     Pain Loc --      Pain Edu? --      Excl. in GC? --     Most recent vital  signs: Vitals:   10/02/22 1138  BP: (!) 149/73  Pulse: 93  Resp: 16  Temp: 97.7 F (36.5 C)  SpO2: 97%    Constitutional: Alert and oriented.  Pale appearing. Eyes: Conjunctivae are normal. Head: Atraumatic. Nose: No congestion/rhinnorhea. Mouth/Throat: Mucous membranes are dry. Cardiovascular: Normal rate, regular rhythm. Grossly normal heart sounds.  2+ radial pulses bilaterally. Respiratory: Normal respiratory effort.  No retractions. Lungs CTAB.  Pleurx catheter in place. Gastrointestinal: Soft and nontender.  Moderate distention noted. Musculoskeletal: No lower extremity tenderness, 2+ pitting edema to thighs bilaterally. Neurologic:  Normal speech and language. No gross focal neurologic deficits are appreciated.    ED Results / Procedures / Treatments   Labs (all labs ordered are listed, but only abnormal results are displayed) Labs Reviewed  CBC WITH DIFFERENTIAL/PLATELET - Abnormal; Notable for the following components:      Result Value   RBC 2.88 (*)    Hemoglobin 6.5 (*)    HCT 21.8 (*)    MCV 75.7 (*)    MCH 22.6 (*)    MCHC 29.8 (*)    RDW 18.7 (*)  Platelets 570 (*)    Monocytes Absolute 1.4 (*)    All other components within normal limits  COMPREHENSIVE METABOLIC PANEL - Abnormal; Notable for the following components:   Sodium 129 (*)    Chloride 94 (*)    Glucose, Bld 106 (*)    Calcium 8.1 (*)    Total Protein 6.2 (*)    Albumin 2.2 (*)    All other components within normal limits  TYPE AND SCREEN  PREPARE RBC (CROSSMATCH)  ABO/RH     EKG  ED ECG REPORT I, Chesley Noon, the attending physician, personally viewed and interpreted this ECG.   Date: 10/02/2022  EKG Time: 11:39  Rate: 94  Rhythm: normal sinus rhythm  Axis: Normal  Intervals:none  ST&T Change: None  PROCEDURES:  Critical Care performed: Yes, see critical care procedure note(s)  .Critical Care  Performed by: Chesley Noon, MD Authorized by: Chesley Noon, MD    Critical care provider statement:    Critical care time (minutes):  30   Critical care time was exclusive of:  Separately billable procedures and treating other patients and teaching time   Critical care was necessary to treat or prevent imminent or life-threatening deterioration of the following conditions: Anemia.   Critical care was time spent personally by me on the following activities:  Development of treatment plan with patient or surrogate, discussions with consultants, evaluation of patient's response to treatment, examination of patient, ordering and review of laboratory studies, ordering and review of radiographic studies, ordering and performing treatments and interventions, pulse oximetry, re-evaluation of patient's condition and review of old charts   I assumed direction of critical care for this patient from another provider in my specialty: no     Care discussed with: admitting provider      MEDICATIONS ORDERED IN ED: Medications  0.9 %  sodium chloride infusion (has no administration in time range)  furosemide (LASIX) injection 40 mg (has no administration in time range)     IMPRESSION / MDM / ASSESSMENT AND PLAN / ED COURSE  I reviewed the triage vital signs and the nursing notes.                              72 y.o. female with past medical history of hypertension, diabetes, endometrial cancer, and pleural effusion status post Pleurx catheter who presents to the ED complaining of increasing weakness, malaise, swelling, and decreased oral intake over about the past month.  Patient's presentation is most consistent with acute presentation with potential threat to life or bodily function.  Differential diagnosis includes, but is not limited to, anemia, vaginal bleeding, metastatic disease, AKI, electrolyte abnormality, peripheral edema.  Patient chronically ill-appearing but in no acute distress, vital signs are unremarkable.  She reports ongoing vaginal bleeding for  the past month, briefly more severe 2 days ago but now improving to her typical baseline.  This is likely related to her known malignancy but she is quite pale appearing.  She also notes lower extremity swelling and abdominal distention, although no abdominal tenderness noted.  Patient was previously offered letrozole for treatment by [facility] oncology, but declined and is not a candidate for traditional chemotherapy after she did not tolerate it well.  She is interested in remaining on hospice care, but primarily presents today due to increasing weakness and inability to care for herself at home.  She is in agreement that CT scan of her abdomen would not  be beneficial as she is not interested in further treatment, however she would be interested in blood transfusion to potentially get some of her strength back.  We will screen labs including CBC and CMP, patient also interested in potentially receiving Lasix for her edema.  Labs show significant anemia with hemoglobin of 6.5, minimal bleeding noted at this time.  Patient is interested in proceeding with transfusion, expresses understanding that it will be difficult for [place] to address the source of the bleeding.  Additional labs show hyponatremia with no significant AKI, LFTs are unremarkable.  We will also give IV Lasix for her peripheral edema.  Given patient's inability to manage current issues at home, case discussed with hospitalist for admission.      FINAL CLINICAL IMPRESSION(S) / ED DIAGNOSES   Final diagnoses:  Generalized weakness  Anemia, unspecified type  Endometrial cancer     Rx / DC Orders   ED Discharge Orders     None        Note:  This document was prepared using Dragon voice recognition software and may include unintentional dictation errors.   Chesley Noon, MD 10/02/22 1316

## 2022-10-02 NOTE — ED Notes (Signed)
Called floor to find out about the delay in handoff, was told RN is looking at the pt's chart now.

## 2022-10-02 NOTE — Assessment & Plan Note (Signed)
-   CPAP at bedtime 

## 2022-10-02 NOTE — H&P (Signed)
History and Physical    Patient: Kathryn Maddox WUJ:[id number] DOB: [birth year] DOA: 10/02/2022 DOS: the patient was seen and examined on 10/02/2022 PCP: Marina Goodell, MD  Patient coming from: Home  Chief Complaint:  Chief Complaint  Patient presents with   Weakness   HPI: Kathryn Maddox is a 72 y.o. female with medical history significant of stage III endometrial cancer complicated by pleural effusions s/p Pleurx drain, type 2 diabetes, hypertension, hyperlipidemia, morbid obesity, OSA on CPAP, who presents to the ED with complaints of generalized weakness.  Kathryn Maddox states that she has been experiencing gradually worsening generalized weakness for the last several weeks but has significantly worsened over the last 1 week.  In addition, she endorses bilateral lower extremity swelling has been gradually worsening as well.  2 days ago, she began to have heavy vaginal bleeding, but states that has improved since then.  She denies any chest pain, palpitations or shortness of breath at this time.  She notes that hospice has placed her on oxygen a few days ago to see if it would help for her weakness but has not.  She endorses lower back pain that is chronic since undergoing chemotherapy.  ED course: On arrival to the ED, patient was hypertensive at 149/73 with heart rate of 90.  She was saturating at 97% on room air.  She was afebrile at 97.7.  Initial workup notable for hemoglobin of 6.5, MCV of 75, platelets of 570, sodium of 129, glucose of 106, creatinine 0.66 and GFR above 60.  1 unit of packed RBC ordered.  [facility] contacted for admission.  Review of Systems: As mentioned in the history of present illness. All other systems reviewed and are negative.  Past Medical History:  Diagnosis Date   Carpal tunnel syndrome on both sides 03/2018   Cellulitis and abscess of lower extremity 03/2018   bilateral lower extrems, treated with antibx x 10 days   Diabetes mellitus without complication  (HCC)    Dyspnea 03/2018   with any exercise, due to weight   Eczema    Endometrial cancer (HCC) 08/26/2020   Hyperlipidemia    Hypertension    Morbid obesity with BMI of 70 and over, adult (HCC) 03/2018   Sleep apnea 03/2018   uses a cpap   Past Surgical History:  Procedure Laterality Date   CARPAL TUNNEL RELEASE Left 04/12/2018   Procedure: CARPAL TUNNEL RELEASE;  Surgeon: Kennedy Bucker, MD;  Location: [facility] ORS;  Service: Orthopedics;  Laterality: Left;   COLONOSCOPY WITH PROPOFOL N/A 12/02/2019   Procedure: COLONOSCOPY WITH PROPOFOL;  Surgeon: Toledo, Boykin Nearing, MD;  Location: [facility] ENDOSCOPY;  Service: Gastroenterology;  Laterality: N/A;   DILATION AND CURETTAGE OF UTERUS  03/2018   has had 10 d & c's with hysteroscopy   HERNIA REPAIR  2011   umbilical w/ obstructed bowel   IR IMAGING GUIDED PORT INSERTION  02/17/2022   LIPOMA EXCISION Right 1997   shoulder   obstructive bowel surgery  01/2010   Social History:  reports that she has never smoked. She has never been exposed to tobacco smoke. She has never used smokeless tobacco. She reports current alcohol use. She reports that she does not use drugs.  Allergies  Allergen Reactions   Megace [Megestrol] Rash   Penicillins Rash and Other (See Comments)    Has patient had a PCN reaction causing immediate rash, facial/tongue/throat swelling, SOB or lightheadedness with hypotension: Unknown Has patient had a PCN reaction causing severe rash involving  mucus membranes or skin necrosis: Unknown Has patient had a PCN reaction that required hospitalization: Unknown Has patient had a PCN reaction occurring within the last 10 years: No If all of the above answers are "NO", then may proceed with Cephalosporin use.    Ibuprofen Hypertension   Pravastatin Other (See Comments)    Muscle pain, extreme constipation    Family History  Problem Relation Age of Onset   Breast cancer Neg Hx     Prior to Admission medications   Medication Sig  Start Date End Date Taking? Authorizing Provider  azithromycin (ZITHROMAX Z-PAK) 250 MG tablet Take two tablets (500mg ) x 1 day, then take 1 tablet (250mg ) until completed 07/21/22   Borders, Daryl Eastern, NP  dexamethasone (DECADRON) 4 MG tablet Take 2 tablets (8 mg total) by mouth daily for 3 days. Start the day after chemotherapy. Take with food. Patient not taking: Reported on 05/18/2022 02/11/22   Michaelyn Barter, MD  Easy Touch Lancets 30G/Twist MISC  11/21/20   [provider]  ergocalciferol (VITAMIN D2) 1.25 MG (50000 UT) capsule Take 1 capsule (50,000 Units total) by mouth once a week. 05/03/22   Borders, Daryl Eastern, NP  lidocaine-prilocaine (EMLA) cream Apply 1 Application topically as needed. Apply to port 1 hour prior to chemotherapy. Cover with plastic wrap. 02/24/22   Michaelyn Barter, MD  losartan (COZAAR) 50 MG tablet Take 50 mg by mouth daily.    [provider]  magnesium chloride (SLOW-MAG) 64 MG TBEC SR tablet Take 1 tablet (64 mg total) by mouth daily. Patient not taking: Reported on 06/21/2022 06/01/22   Borders, Daryl Eastern, NP  Morphine Sulfate (MORPHINE CONCENTRATE) 10 mg / 0.5 ml concentrated solution Take 0.25 mLs (5 mg total) by mouth every 2 (two) hours as needed for severe pain or shortness of breath. 08/23/22   Borders, Daryl Eastern, NP  ondansetron (ZOFRAN) 8 MG tablet Take 1 tablet (8 mg total) by mouth every 8 (eight) hours as needed for nausea or vomiting. Start on the third day after chemotherapy. Patient not taking: Reported on 05/04/2022 02/11/22   Michaelyn Barter, MD  potassium chloride (KLOR-CON M) 10 MEQ tablet Take 1 tablet (10 mEq total) by mouth daily. Patient not taking: Reported on 06/21/2022 06/01/22   Borders, Daryl Eastern, NP  prochlorperazine (COMPAZINE) 10 MG tablet Take 1 tablet (10 mg total) by mouth every 6 (six) hours as needed for nausea or vomiting. Patient not taking: Reported on 03/15/2022 02/11/22   Michaelyn Barter, MD  tirzepatide [facility]) 5 MG/0.5ML  Pen INJECT 5 MG UNDER THE SKIN EVERY 7 DAYS 12/14/21   [provider]    Physical Exam: Vitals:   10/02/22 1134 10/02/22 1138 10/02/22 1346  BP:  (!) 149/73 (!) 114/58  Pulse:  93 88  Resp:  16 (!) 25  Temp:  97.7 F (36.5 C) (!) 97.4 F (36.3 C)  TempSrc:  Oral Oral  SpO2:  97%   Weight: (!) 138.1 kg    Height: 5\' 1"  (1.549 m)     Physical Exam Vitals and nursing note reviewed.  Constitutional:      General: She is not in acute distress.    Appearance: She is obese.  HENT:     Head: Normocephalic and atraumatic.     Mouth/Throat:     Mouth: Mucous membranes are moist.     Pharynx: Oropharynx is clear.  Eyes:     Conjunctiva/sclera: Conjunctivae normal.     Pupils: Pupils are equal,  round, and reactive to light.  Cardiovascular:     Rate and Rhythm: Normal rate and regular rhythm.     Heart sounds: No murmur heard.    No gallop.  Pulmonary:     Effort: Pulmonary effort is normal. No respiratory distress.     Breath sounds: Normal breath sounds. No wheezing, rhonchi or rales.  Abdominal:     Palpations: Abdomen is soft.  Musculoskeletal:     Lumbar back: Tenderness (Spinal and paraspinal tenderness to palpation) present.     Comments: Trace pitting edema of bilateral lower extremities  Skin:    General: Skin is warm and dry.     Coloration: Skin is pale.  Neurological:     General: No focal deficit present.     Mental Status: She is alert and oriented to person, place, and time.  Psychiatric:        Mood and Affect: Mood normal.        Behavior: Behavior normal.    Data Reviewed: CBC with WBC of 9.8, hemoglobin 6.7, MCV of 75, platelets of 570 CMP with sodium of 129, potassium 4.0, chloride 94, glucose 106, bicarb 23, BUN 22, creatinine 0.66, albumin 2.2, AST 21, ALT 8, and GFR above 60  EKG personally reviewed.  Sinus rhythm with rate of 94.  QTc of 437.  No evidence of acute ischemic changes.  There are no new results to review at this  time.  Assessment and Plan:  * Symptomatic anemia Patient is presenting with gradually worsening generalized weakness that has significantly worsened since experiencing heavy vaginal bleeding.  History of chronic anemia with acute worsening with hemoglobin of 6.5 at this time.  Previously 8.0 approximately 3 weeks ago.  Patient states her goal is to improve her strength to be able to go home with home hospice.  She consents to blood transfusion.  In addition, if indicated, she consents to IV iron as well.  - 1 unit of packed RBC ordered - Posttransfusion CBC - Iron and TIBC pending  Generalized weakness Multifactorial in the setting of endometrial cancer with metastasis, poor nutrition, acute on chronic anemia, and deconditioning.  Patient is currently followed by hospice, however I am concerned she may need a higher level of care without some improvement in her weakness.  - Palliative consulted to assist with goals of care and appropriate discharge planning - PT/OT - Consider appetite stimulant  Hypoalbuminemia Patient with bilateral lower extremity edema, likely in the setting of hypoalbuminemia.  Received one-time dose of Lasix in the ED.  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  Pleurx catheter in place with no requirement for drainage at this time  Type 2 diabetes mellitus, without long-term current use of insulin - Hold home antiglycemic agents - SSI  OSA (obstructive sleep apnea) - CPAP at bedtime  Advance Care Planning:   Code Status: DNR/DNI.  Patient states her current quality of life is quite poor and she would not want any resuscitative efforts in the event of cardiac or pulmonary arrest given her chance of meaningful recovery is very low.  Consults: Palliative  care  Family Communication: No family at bedside  Severity of Illness: The appropriate patient status for this patient is OBSERVATION. Observation status is judged to be reasonable and necessary in order to  provide the required intensity of service to ensure the patient's safety. The patient's presenting symptoms, physical exam findings, and initial radiographic and laboratory data in the context of their medical condition is felt to place them at decreased risk for further clinical deterioration. Furthermore, it is anticipated that the patient will be medically stable for discharge from the hospital within 2 midnights of admission.   Author: Verdene Lennert, MD 10/02/2022 1:54 PM  For on call review [email or web address].

## 2022-10-02 NOTE — Progress Notes (Signed)
Civil engineer, contracting [facility])       This patient is a current hospice patient with [facility], admitted with a terminal diagnosis Endometrial cancer   [facility] will continue to follow for any discharge planning needs and to coordinate continuation of hospice care.     Please call with any questions/concerns.    Thank you for the opportunity to participate in this patient's care.  Odette Fraction, MSW [facility] Liaison  [phone]

## 2022-10-02 NOTE — ED Notes (Signed)
Palliative NP at bedside 

## 2022-10-03 DIAGNOSIS — D62 Acute posthemorrhagic anemia: Secondary | ICD-10-CM | POA: Diagnosis present

## 2022-10-03 DIAGNOSIS — Z7985 Long-term (current) use of injectable non-insulin antidiabetic drugs: Secondary | ICD-10-CM | POA: Diagnosis not present

## 2022-10-03 DIAGNOSIS — E785 Hyperlipidemia, unspecified: Secondary | ICD-10-CM | POA: Diagnosis present

## 2022-10-03 DIAGNOSIS — D509 Iron deficiency anemia, unspecified: Secondary | ICD-10-CM | POA: Diagnosis present

## 2022-10-03 DIAGNOSIS — Z886 Allergy status to analgesic agent status: Secondary | ICD-10-CM | POA: Diagnosis not present

## 2022-10-03 DIAGNOSIS — E8809 Other disorders of plasma-protein metabolism, not elsewhere classified: Secondary | ICD-10-CM | POA: Diagnosis present

## 2022-10-03 DIAGNOSIS — J910 Malignant pleural effusion: Secondary | ICD-10-CM | POA: Diagnosis present

## 2022-10-03 DIAGNOSIS — E119 Type 2 diabetes mellitus without complications: Secondary | ICD-10-CM | POA: Diagnosis present

## 2022-10-03 DIAGNOSIS — Z880 Allergy status to penicillin: Secondary | ICD-10-CM | POA: Diagnosis not present

## 2022-10-03 DIAGNOSIS — Z9221 Personal history of antineoplastic chemotherapy: Secondary | ICD-10-CM | POA: Diagnosis not present

## 2022-10-03 DIAGNOSIS — Z888 Allergy status to other drugs, medicaments and biological substances status: Secondary | ICD-10-CM | POA: Diagnosis not present

## 2022-10-03 DIAGNOSIS — I10 Essential (primary) hypertension: Secondary | ICD-10-CM | POA: Diagnosis present

## 2022-10-03 DIAGNOSIS — G4733 Obstructive sleep apnea (adult) (pediatric): Secondary | ICD-10-CM | POA: Diagnosis present

## 2022-10-03 DIAGNOSIS — R54 Age-related physical debility: Secondary | ICD-10-CM | POA: Diagnosis present

## 2022-10-03 DIAGNOSIS — Z515 Encounter for palliative care: Secondary | ICD-10-CM

## 2022-10-03 DIAGNOSIS — R600 Localized edema: Secondary | ICD-10-CM | POA: Diagnosis present

## 2022-10-03 DIAGNOSIS — Z6841 Body Mass Index (BMI) 40.0 and over, adult: Secondary | ICD-10-CM | POA: Diagnosis not present

## 2022-10-03 DIAGNOSIS — R531 Weakness: Secondary | ICD-10-CM | POA: Diagnosis not present

## 2022-10-03 DIAGNOSIS — M545 Low back pain, unspecified: Secondary | ICD-10-CM | POA: Diagnosis present

## 2022-10-03 DIAGNOSIS — G8929 Other chronic pain: Secondary | ICD-10-CM | POA: Diagnosis present

## 2022-10-03 DIAGNOSIS — C782 Secondary malignant neoplasm of pleura: Secondary | ICD-10-CM | POA: Diagnosis present

## 2022-10-03 DIAGNOSIS — C541 Malignant neoplasm of endometrium: Secondary | ICD-10-CM | POA: Diagnosis present

## 2022-10-03 DIAGNOSIS — E871 Hypo-osmolality and hyponatremia: Secondary | ICD-10-CM | POA: Diagnosis present

## 2022-10-03 DIAGNOSIS — Z66 Do not resuscitate: Secondary | ICD-10-CM | POA: Diagnosis present

## 2022-10-03 DIAGNOSIS — D649 Anemia, unspecified: Secondary | ICD-10-CM | POA: Diagnosis not present

## 2022-10-03 LAB — CBC
HCT: 23.9 % — ABNORMAL LOW (ref 36.0–46.0)
Hemoglobin: 7.5 g/dL — ABNORMAL LOW (ref 12.0–15.0)
MCH: 23.8 pg — ABNORMAL LOW (ref 26.0–34.0)
MCHC: 31.4 g/dL (ref 30.0–36.0)
MCV: 75.9 fL — ABNORMAL LOW (ref 80.0–100.0)
Platelets: 481 10*3/uL — ABNORMAL HIGH (ref 150–400)
RBC: 3.15 MIL/uL — ABNORMAL LOW (ref 3.87–5.11)
RDW: 18.2 % — ABNORMAL HIGH (ref 11.5–15.5)
WBC: 8.8 10*3/uL (ref 4.0–10.5)
nRBC: 0 % (ref 0.0–0.2)

## 2022-10-03 LAB — BASIC METABOLIC PANEL
Anion gap: 10 (ref 5–15)
BUN: 22 mg/dL (ref 8–23)
CO2: 24 mmol/L (ref 22–32)
Calcium: 7.8 mg/dL — ABNORMAL LOW (ref 8.9–10.3)
Chloride: 94 mmol/L — ABNORMAL LOW (ref 98–111)
Creatinine, Ser: 0.56 mg/dL (ref 0.44–1.00)
GFR, Estimated: >60 mL/min (ref >60)
Glucose, Bld: 100 mg/dL — ABNORMAL HIGH (ref 70–99)
Potassium: 4.1 mmol/L (ref 3.5–5.1)
Sodium: 128 mmol/L — ABNORMAL LOW (ref 135–145)

## 2022-10-03 LAB — GLUCOSE, CAPILLARY
Glucose-Capillary: 98 mg/dL (ref 70–99)
Glucose-Capillary: 96 mg/dL (ref 70–99)
Glucose-Capillary: 98 mg/dL (ref 70–99)
Glucose-Capillary: 109 mg/dL — ABNORMAL HIGH (ref 70–99)

## 2022-10-03 LAB — BPAM RBC
Blood Product Expiration Date: 202404172359
ISSUE DATE / TIME: 202404141338
Unit Type and Rh: 9500

## 2022-10-03 LAB — PREPARE RBC (CROSSMATCH)

## 2022-10-03 LAB — FERRITIN: Ferritin: 513 ng/mL — ABNORMAL HIGH (ref 11–307)

## 2022-10-03 MED ORDER — SODIUM CHLORIDE 0.9% IV SOLUTION: Freq: Once | INTRAVENOUS | Status: AC

## 2022-10-03 NOTE — Progress Notes (Signed)
[facility]) Hospitalized Hospice patient visit  Ms. Gotts is a current hospice patient with a terminal diagnosis of malignant neoplasm of the endometrium. She began experiencing weakness and shortness of breath and presented to ED via EMS for evaluation. She was admitted to the hospital with a diagnosis of symptomatic anemia on 4.14.24. [facility] was not notified of this transfer until 4.15.24. Per Dr. Patric Dykes with [facility] this is a related hospital admission.   Visited with patient at bedside. She is awake and alert and able to verbalize her needs. Her nurse is at bedside and getting ready to start second unit of blood. She is in pain throughout visit and is given Dilaudid during our visit making conversations a bit more difficult. Patient reports it is her plan to go home once she receives treatment and would like to continue with hospice services.   Patient is inpatient appropriate at this time due to need for blood transfusions and IV pain medications.   Vital Signs- 97.9/73/17    112/61    98% room air  I&O- Not yet recorded Abnormal Labs- Na+ 128, Chloride 94, Ca+ 7.8, Albumin 2.2, Iron 16, RBC 3.15, Hgb 7.5, Hct 23.9,  Platelets 481 Diagnostics- N/A  IV/PRN Meds- Dilaudid 1mg  IV x6  Problem List- Symptomatic anemia Patient is presenting with gradually worsening generalized weakness that has significantly worsened since experiencing heavy vaginal bleeding.  History of chronic anemia with acute worsening with hemoglobin of 6.5 at this time.  Previously 8.0 approximately 3 weeks ago.  Patient states her goal is to improve her strength to be able to go home with home hospice.  She consents to blood transfusion.  In addition, if indicated, she consents to IV iron as well.   - 1 unit of packed RBC ordered - Posttransfusion CBC - Iron and TIBC pending   Generalized weakness Multifactorial in the setting of endometrial cancer with metastasis, poor nutrition, acute on  chronic anemia, and deconditioning.  Patient is currently followed by hospice, however I am concerned she may need a higher level of care without some improvement in her weakness.   - Palliative consulted to assist with goals of care and appropriate discharge planning - PT/OT - Consider appetite stimulant   Hypoalbuminemia Patient with bilateral lower extremity edema, likely in the setting of hypoalbuminemia.  Received one-time dose of Lasix in the ED.   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  Pleurx catheter in place with no requirement for drainage at this time   Type 2 diabetes mellitus, without long-term current use of insulin - Hold home antiglycemic agents - SSI   OSA (obstructive sleep apnea) - CPAP at bedtime  Discharge Planning-  Currently patient plans to discharge back home once treatment is complete.  Family Contact- Patient is her own spokesperson IDT: Updated Goals of Care: DNR but wants treatment currently Thank you for the opportunity to participate in this patient's care, please don't hesitate to call for any hospice related questions or concerns.  Thea Gist BSN, [employer] [phone]

## 2022-10-03 NOTE — Plan of Care (Signed)
  Problem: Skin Integrity: Goal: Risk for impaired skin integrity will decrease Outcome: Progressing   Problem: Tissue Perfusion: Goal: Adequacy of tissue perfusion will improve Outcome: Progressing   Problem: Education: Goal: Knowledge of General Education information will improve Description: Including pain rating scale, medication(s)/side effects and non-pharmacologic comfort measures Outcome: Progressing   Problem: Clinical Measurements: Goal: Ability to maintain clinical measurements within normal limits will improve Outcome: Progressing Goal: Will remain free from infection Outcome: Progressing Goal: Diagnostic test results will improve Outcome: Progressing Goal: Respiratory complications will improve Outcome: Progressing Goal: Cardiovascular complication will be avoided Outcome: Progressing   Problem: Pain Managment: Goal: General experience of comfort will improve Outcome: Progressing

## 2022-10-03 NOTE — Progress Notes (Signed)
Progress Note    Kathryn Maddox  LYY:[id number] DOB: [birth year]  DOA: 10/02/2022 PCP: Marina Goodell, MD      Brief Narrative:    Medical records reviewed and are as summarized below:  Kathryn Maddox is a 72 y.o. female  with medical history significant of stage III endometrial cancer complicated by pleural effusions s/p Pleurx drain, type 2 diabetes, hypertension, hyperlipidemia, morbid obesity, OSA on CPAP, who presented to the hospital with generalized weakness, bilateral lower extremity swelling and vaginal bleeding.      Assessment/Plan:   Principal Problem:   Symptomatic anemia Active Problems:   Generalized weakness   Hypoalbuminemia   Back pain   Endometrial cancer   Type 2 diabetes mellitus, without long-term current use of insulin   OSA (obstructive sleep apnea)    Body mass index is 57.53 kg/m.  (Morbid obesity)   Symptomatic anemia, likely from acute blood loss anemia, underlying chronic anemia: Hemoglobin is 7.5.  S/p transfusion of 1 unit of PRBCs.  Transfuse another unit of PRBCs because of   General weakness: This is likely multifactorial from endometrial cancer, anemia and deconditioning   Endometrial cancer with metastasis to the, vaginal bleeding: She has plus catheter in place.  She is no longer on chemotherapy.  She is on hospice care at home.   Hyponatremia: Probably due to malignancy.  Repeat BMP tomorrow   Chronic back pain: Analgesics as needed for pain   OSA: Continue CPAP at night.   Hypoalbuminemia, chronic lower extremity edema: Encouraged adequate oral intake   Type II DM: NovoLog as needed.  Diet Order             Diet regular Room service appropriate? Yes; Fluid consistency: Thin  Diet effective now                            Consultants: None  Procedures: None    Medications:    sodium chloride   Intravenous Once   acetaminophen  1,000 mg Oral Q6H   furosemide  40 mg Intravenous  Once   insulin aspart  0-9 Units Subcutaneous TID WC   sodium chloride flush  3 mL Intravenous Q12H   Continuous Infusions:  sodium chloride       Anti-infectives (From admission, onward)    None              Family Communication/Anticipated D/C date and plan/Code Status   DVT prophylaxis: SCDs Start: 10/02/22 1342     Code Status: DNR  Family Communication: None Disposition Plan: Plan to discharge home tomorrow   Status is: Observation The patient will require care spanning > 2 midnights and should be moved to inpatient because: Symptomatic anemia requiring blood transfusion and H&H monitoring       Subjective:   Interval events noted.  No vaginal bleeding.  She feels weak.  Objective:    Vitals:   10/03/22 0817 10/03/22 1302 10/03/22 1317 10/03/22 1551  BP: (!) 110/53 95/68 112/61 121/60  Pulse: 87 98 96 94  Resp: 20 16 17 16   Temp: 98.4 F (36.9 C) 98.1 F (36.7 C) 97.9 F (36.6 C) 97.8 F (36.6 C)  TempSrc: Oral Oral Oral Oral  SpO2: 95% 98% 98% 98%  Weight:      Height:       No data found.  No intake or output data in the 24 hours ending 10/03/22 1647 [employer]  10/02/22 1134  Weight: (!) 138.1 kg    Exam:  GEN: NAD SKIN: Warm and dry EYES: Pale but anicteric ENT: MMM CV: RRR PULM: CTA B ABD: soft, obese, NT, +BS CNS: AAO x 3, non focal EXT: Bilateral lower extremity edema.  No erythema or tenderness GU: Vaginal exam deferred        Data Reviewed:   I have personally reviewed following labs and imaging studies:  Labs: Labs show the following:   Basic Metabolic Panel: Recent Labs  Lab 10/02/22 1136 10/03/22 0346  NA 129* 128*  K 4.0 4.1  CL 94* 94*  CO2 23 24  GLUCOSE 106* 100*  BUN 22 22  CREATININE 0.66 0.56  CALCIUM 8.1* 7.8*   GFR Estimated Creatinine Clearance: 85.4 mL/min (by C-G formula based on SCr of 0.56 mg/dL). Liver Function Tests: Recent Labs  Lab 10/02/22 1136  AST 21  ALT 8   ALKPHOS 51  BILITOT 0.7  PROT 6.2*  ALBUMIN 2.2*   No results for input(s): "LIPASE", "AMYLASE" in the last 168 hours. No results for input(s): "AMMONIA" in the last 168 hours. Coagulation profile No results for input(s): "INR", "PROTIME" in the last 168 hours.  CBC: Recent Labs  Lab 10/02/22 1136 10/02/22 1854 10/03/22 0346  WBC 9.8 9.4 8.8  NEUTROABS 7.3  --   --   HGB 6.5* 7.3* 7.5*  HCT 21.8* 23.6* 23.9*  MCV 75.7* 76.1* 75.9*  PLT 570* 498* 481*   Cardiac Enzymes: No results for input(s): "CKTOTAL", "CKMB", "CKMBINDEX", "TROPONINI" in the last 168 hours. BNP (last 3 results) No results for input(s): "PROBNP" in the last 8760 hours. CBG: Recent Labs  Lab 10/02/22 1820 10/02/22 2041 10/03/22 0821 10/03/22 1219  GLUCAP 91 92 98 96   D-Dimer: No results for input(s): "DDIMER" in the last 72 hours. Hgb A1c: No results for input(s): "HGBA1C" in the last 72 hours. Lipid Profile: No results for input(s): "CHOL", "HDL", "LDLCALC", "TRIG", "CHOLHDL", "LDLDIRECT" in the last 72 hours. Thyroid function studies: No results for input(s): "TSH", "T4TOTAL", "T3FREE", "THYROIDAB" in the last 72 hours.  Invalid input(s): "FREET3" Anemia work up: Recent Labs    10/02/22 1136 10/03/22 0340  FERRITIN  --  513*  TIBC 153*  --   IRON 16*  --    Sepsis Labs: Recent Labs  Lab 10/02/22 1136 10/02/22 1854 10/03/22 0346  WBC 9.8 9.4 8.8    Microbiology No results found for this or any previous visit (from the past 240 hour(s)).  Procedures and diagnostic studies:  No results found.             LOS: 0 days   Armoni Depass  [facility]   Pager on [email or web address]. If 7PM-7AM, please contact night-coverage at [email or web address]     10/03/2022, 4:47 PM

## 2022-10-03 NOTE — Progress Notes (Signed)
                                                     Palliative Care Progress Note, Assessment & Plan   Patient Name: Kathryn Maddox       Date: 10/03/2022 DOB: 10-27-1950  Age: 72 y.o. MRN#: [id number] Attending Physician: Lurene Shadow, MD Primary Care Physician: Marina Goodell, MD Admit Date: 10/02/2022  Summary of counseling/coordination of care: After reviewing the patient's chart, I counseled with outpatient palliative provider NP Josh Borders as well as [facility] hospice liaison Misty in regards to plan and goals of care.  At this time, [facility] is managing goals of care discussions with patient.  Please see [facility] hospice note for further details.  [facility] liaison Misty aware that PMT will shadow the chart and monitor patient for decline during hospitalization. [facility] aware to reach out to PMT if palliative needs arise.  Samara Deist L. Manon Hilding, FNP-BC Palliative Medicine Team Team Phone # [phone]  NO CHARGE

## 2022-10-04 DIAGNOSIS — C541 Malignant neoplasm of endometrium: Secondary | ICD-10-CM | POA: Diagnosis not present

## 2022-10-04 DIAGNOSIS — D649 Anemia, unspecified: Secondary | ICD-10-CM | POA: Diagnosis not present

## 2022-10-04 LAB — BASIC METABOLIC PANEL
Anion gap: 7 (ref 5–15)
BUN: 22 mg/dL (ref 8–23)
CO2: 28 mmol/L (ref 22–32)
Calcium: 7.6 mg/dL — ABNORMAL LOW (ref 8.9–10.3)
Chloride: 95 mmol/L — ABNORMAL LOW (ref 98–111)
Creatinine, Ser: 0.52 mg/dL (ref 0.44–1.00)
GFR, Estimated: >60 mL/min (ref >60)
Glucose, Bld: 100 mg/dL — ABNORMAL HIGH (ref 70–99)
Potassium: 4.3 mmol/L (ref 3.5–5.1)
Sodium: 130 mmol/L — ABNORMAL LOW (ref 135–145)

## 2022-10-04 LAB — TYPE AND SCREEN
ABO/RH(D): O NEG
Antibody Screen: NEGATIVE
Unit division: 0
Unit division: 0

## 2022-10-04 LAB — CBC WITH DIFFERENTIAL/PLATELET
Abs Immature Granulocytes: 0.1 10*3/uL — ABNORMAL HIGH (ref 0.00–0.07)
Basophils Absolute: 0 10*3/uL (ref 0.0–0.1)
Basophils Relative: 0 %
Eosinophils Absolute: 0.1 10*3/uL (ref 0.0–0.5)
Eosinophils Relative: 1 %
HCT: 26.5 % — ABNORMAL LOW (ref 36.0–46.0)
Hemoglobin: 8.4 g/dL — ABNORMAL LOW (ref 12.0–15.0)
Immature Granulocytes: 1 %
Lymphocytes Relative: 9 %
Lymphs Abs: 0.8 10*3/uL (ref 0.7–4.0)
MCH: 24.5 pg — ABNORMAL LOW (ref 26.0–34.0)
MCHC: 31.7 g/dL (ref 30.0–36.0)
MCV: 77.3 fL — ABNORMAL LOW (ref 80.0–100.0)
Monocytes Absolute: 1.4 10*3/uL — ABNORMAL HIGH (ref 0.1–1.0)
Monocytes Relative: 15 %
Neutro Abs: 6.7 10*3/uL (ref 1.7–7.7)
Neutrophils Relative %: 74 %
Platelets: 487 10*3/uL — ABNORMAL HIGH (ref 150–400)
RBC: 3.43 MIL/uL — ABNORMAL LOW (ref 3.87–5.11)
RDW: 18.3 % — ABNORMAL HIGH (ref 11.5–15.5)
WBC: 9.1 10*3/uL (ref 4.0–10.5)
nRBC: 0 % (ref 0.0–0.2)

## 2022-10-04 LAB — BPAM RBC
ISSUE DATE / TIME: 202404151243
Unit Type and Rh: 9500

## 2022-10-04 LAB — GLUCOSE, CAPILLARY
Glucose-Capillary: 95 mg/dL (ref 70–99)
Glucose-Capillary: 103 mg/dL — ABNORMAL HIGH (ref 70–99)
Glucose-Capillary: 97 mg/dL (ref 70–99)
Glucose-Capillary: 126 mg/dL — ABNORMAL HIGH (ref 70–99)

## 2022-10-04 MED ORDER — ALUM & MAG HYDROXIDE-SIMETH 200-200-20 MG/5ML PO SUSP
15.0000 mL | Freq: Four times a day (QID) | ORAL | Status: DC | PRN
  Administered 2022-10-04: 15 mL via ORAL
  Filled 2022-10-04: qty 30

## 2022-10-04 NOTE — Progress Notes (Signed)
Progress Note    Kathryn Maddox  KVQ:[id number] DOB: 01/01/1951  DOA: 10/02/2022 PCP: Marina Goodell, MD      Brief Narrative:    Medical records reviewed and are as summarized below:  Kathryn Maddox is a 72 y.o. female  with medical history significant of stage III endometrial cancer complicated by pleural effusions s/p Pleurx drain, type 2 diabetes, hypertension, hyperlipidemia, morbid obesity, OSA on CPAP, who presented to the hospital with generalized weakness, bilateral lower extremity swelling and vaginal bleeding.      Assessment/Plan:   Principal Problem:   Symptomatic anemia Active Problems:   Generalized weakness   Hypoalbuminemia   Back pain   Endometrial cancer   Type 2 diabetes mellitus, without long-term current use of insulin   OSA (obstructive sleep apnea)    Body mass index is 57.53 kg/m.  (Morbid obesity)   Symptomatic anemia, likely from acute blood loss anemia, underlying chronic anemia: Hemoglobin is 8.2.  S/p transfusion with 2 units of PRBCs.   General weakness: This is likely multifactorial from endometrial cancer, anemia and deconditioning.  Her friends are concerned that she has been falling at home and she does not have any support.  They also says she does not have any equipment to help her get around.  Consult PT and OT.  Consulted Child psychotherapist to assist as well.   Endometrial cancer with metastasis to the, vaginal bleeding: She has plus catheter in place.  She is no longer on chemotherapy.  She is on hospice care at home.   Hyponatremia: Sodium is better today.  Probably due to malignancy.     Chronic back pain: Analgesics as needed for pain   OSA: Continue CPAP at night.   Hypoalbuminemia, chronic lower extremity edema: Encouraged adequate oral intake   Type II DM: NovoLog as needed.  Diet Order             Diet regular Room service appropriate? Yes; Fluid consistency: Thin  Diet effective now                             Consultants: None  Procedures: None    Medications:    acetaminophen  1,000 mg Oral Q6H   furosemide  40 mg Intravenous Once   insulin aspart  0-9 Units Subcutaneous TID WC   sodium chloride flush  3 mL Intravenous Q12H   Continuous Infusions:  sodium chloride       Anti-infectives (From admission, onward)    None              Family Communication/Anticipated D/C date and plan/Code Status   DVT prophylaxis: SCDs Start: 10/02/22 1342     Code Status: DNR  Family Communication: Plan discussed with Kerrin Champagne, her friends, at the bedside Disposition Plan: Plan to discharge home tomorrow   Status is: Inpatient Remains inpatient appropriate because: No safe discharge plan         Subjective:   Interval events noted.  She feels a little better today.  No report of vaginal bleeding.  Her friends, [name] and Britta Mccreedy, were at the bedside.    Objective:    Vitals:   10/03/22 1721 10/03/22 2018 10/04/22 0458 10/04/22 0814  BP: (!) 123/57 (!) 118/55 (!) 110/56 (!) 116/54  Pulse: 90 91 80 88  Resp: Temp: 97.9 F (36.6 C) 98.6 F (37 C) 98.7 F (37.1 C)  98.1 F (36.7 C)  TempSrc:      SpO2: 95% 95% 98% 93%  Weight:      Height:       No data found.   Intake/Output Summary (Last 24 hours) at 10/04/2022 1125 Last data filed at 10/04/2022 0816 Gross per 24 hour  Intake 240 ml  Output 400 ml  Net -160 ml   Filed Weights   10/02/22 1134  Weight: (!) 138.1 kg    Exam:  GEN: NAD SKIN: Warm and dry EYES: EOMI ENT: MMM CV: RRR PULM: CTA B ABD: soft, obese, NT, +BS CNS: AAO x 3, non focal EXT: B/l leg edema, no tenderness      Data Reviewed:   I have personally reviewed following labs and imaging studies:  Labs: Labs show the following:   Basic Metabolic Panel: Recent Labs  Lab 10/02/22 1136 10/03/22 0346 10/04/22 0420  NA 129* 128* 130*  K 4.0 4.1 4.3  CL 94* 94* 95*  CO2  GLUCOSE 106* 100* 100*  BUN CREATININE 0.66 0.56 0.52  CALCIUM 8.1* 7.8* 7.6*   GFR Estimated Creatinine Clearance: 85.4 mL/min (by C-G formula based on SCr of 0.52 mg/dL). Liver Function Tests: Recent Labs  Lab 10/02/22 1136  AST 21  ALT 8  ALKPHOS 51  BILITOT 0.7  PROT 6.2*  ALBUMIN 2.2*   No results for input(s): "LIPASE", "AMYLASE" in the last 168 hours. No results for input(s): "AMMONIA" in the last 168 hours. Coagulation profile No results for input(s): "INR", "PROTIME" in the last 168 hours.  CBC: Recent Labs  Lab 10/02/22 1136 10/02/22 1854 10/03/22 0346 10/04/22 0420  WBC 9.8 9.4 8.8 9.1  NEUTROABS 7.3  --   --  6.7  HGB 6.5* 7.3* 7.5* 8.4*  HCT 21.8* 23.6* 23.9* 26.5*  MCV 75.7* 76.1* 75.9* 77.3*  PLT 570* 498* 481* 487*   Cardiac Enzymes: No results for input(s): "CKTOTAL", "CKMB", "CKMBINDEX", "TROPONINI" in the last 168 hours. BNP (last 3 results) No results for input(s): "PROBNP" in the last 8760 hours. CBG: Recent Labs  Lab 10/03/22 0821 10/03/22 1219 10/03/22 1722 10/03/22 2019 10/04/22 0817  GLUCAP 98 96 98 109* 95   D-Dimer: No results for input(s): "DDIMER" in the last 72 hours. Hgb A1c: No results for input(s): "HGBA1C" in the last 72 hours. Lipid Profile: No results for input(s): "CHOL", "HDL", "LDLCALC", "TRIG", "CHOLHDL", "LDLDIRECT" in the last 72 hours. Thyroid function studies: No results for input(s): "TSH", "T4TOTAL", "T3FREE", "THYROIDAB" in the last 72 hours.  Invalid input(s): "FREET3" Anemia work up: Recent Labs    10/02/22 1136 10/03/22 0340  FERRITIN  --  513*  TIBC 153*  --   IRON 16*  --    Sepsis Labs: Recent Labs  Lab 10/02/22 1136 10/02/22 1854 10/03/22 0346 10/04/22 0420  WBC 9.8 9.4 8.8 9.1    Microbiology No results found for this or any previous visit (from the past 240 hour(s)).  Procedures and diagnostic studies:  No results found.             LOS: 1 day    Deamonte Sayegh  [facility]   Pager on [email or web address]. If 7PM-7AM, please contact night-coverage at [email or web address]     10/04/2022, 11:25 AM

## 2022-10-04 NOTE — Evaluation (Signed)
Physical Therapy Evaluation Patient Details Name: Kathryn Maddox MRN: [id number] DOB: 09-26-1950 Today's Date: 10/04/2022  History of Present Illness  Kathryn Maddox is a 72 y.o. female with medical history significant of stage III endometrial cancer complicated by pleural effusions s/p Pleurx drain, type 2 diabetes, hypertension, hyperlipidemia, morbid obesity, OSA on CPAP, who presents to the ED with complaints of generalized weakness.   Clinical Impression  Pt admitted with above diagnosis. Pt currently with functional limitations due to the deficits listed below (see PT Problem List). Pt received upright in bed agreeable to PT/OT co-eval. Pt at baseline gets hospice care reliant on 2 person assist for mobility b/t friends, home aide and hospice workers. She is a short household ambulator with her RW and reliant on assist with all ADL's/IADL's.   To date pt relies on bed features and modA+2 for supine to sit on L side of bed, minguard+2 for STS at BRW with poor hand placement and bouts of momentum. X2 efforts performed with L lateral side steps towards HOB with correct sequencing of BRW with LE's. Time spent sitting EOB for skin integrity with discussion with pt on equipment recommendations to assist in reduced falls risk, reduce caregiver burden and optimize independence in the setting of home with hospice care. Pt agreeable returning to supine with maxA+2 and totalA +2 bed in trendelenburg to scoot up towards HOB. Pt with all needs in reach. Will continue to follow acutely per POC top address mobility and strength deficits.     Recommendations for follow up therapy are one component of a multi-disciplinary discharge planning process, led by the attending physician.  Recommendations may be updated based on patient status, additional functional criteria and insurance authorization.     Assistance Recommended at Discharge Frequent or constant Supervision/Assistance  Patient can return home with  the following  Two people to help with walking and/or transfers;Two people to help with bathing/dressing/bathroom;Assistance with cooking/housework;Help with stairs or ramp for entrance    Equipment Recommendations Hospital bed (drop arm, bariatric BSC)  Recommendations for Other Services       Functional Status Assessment Patient has had a recent decline in their functional status and/or demonstrates limited ability to make significant improvements in function in a reasonable and predictable amount of time     Precautions / Restrictions Precautions Precautions: Fall Restrictions Weight Bearing Restrictions: No      Mobility  Bed Mobility Overal bed mobility: Needs Assistance Bed Mobility: Supine to Sit, Sit to Supine     Supine to sit: Mod assist, +2 for physical assistance, HOB elevated Sit to supine: Max assist, +2 for physical assistance     Patient Response: Cooperative  Transfers Overall transfer level: Needs assistance Equipment used: Rolling walker (2 wheels) Transfers: Sit to/from Stand Sit to Stand: Min guard           General transfer comment: VC's for hand placement    Ambulation/Gait Ambulation/Gait assistance: Min guard Gait Distance (Feet): 2 Feet Assistive device: Rolling walker (2 wheels) (bari) Gait Pattern/deviations: Step-to pattern       General Gait Details: side steps to [facility]  Stairs            Wheelchair Mobility    Modified Rankin (Stroke Patients Only)       Balance Overall balance assessment: Needs assistance Sitting-balance support: No upper extremity supported, Feet supported       Standing balance support: Bilateral upper extremity supported Standing balance-Leahy Scale: Poor Standing balance comment: significant BUE support on  AD                             Pertinent Vitals/Pain Pain Assessment Pain Assessment: Faces Faces Pain Scale: No hurt    Home Living Family/patient expects to be  discharged to:: Private residence Living Arrangements: Alone Available Help at Discharge: Available PRN/intermittently;Personal care attendant Type of Home: House Home Access: Stairs to enter   [employer] of Steps: 1 from garage   Home Layout: One level Home Equipment: Agricultural consultant (2 wheels);Rollator (4 wheels)      Prior Function Prior Level of Function : Needs assist       Physical Assist : Mobility (physical);ADLs (physical) Mobility (physical): Bed mobility;Transfers;Gait ADLs (physical): Feeding;Grooming;Bathing;Dressing;Toileting Mobility Comments: requires 2 person assist at baseline and RW ADLs Comments: house aide, friends, and hospice assist in ADL's     Hand Dominance        Extremity/Trunk Assessment   Upper Extremity Assessment Upper Extremity Assessment: Generalized weakness    Lower Extremity Assessment Lower Extremity Assessment: Generalized weakness    Cervical / Trunk Assessment Cervical / Trunk Assessment: Normal  Communication   Communication: No difficulties  Cognition Arousal/Alertness: Awake/alert Behavior During Therapy: WFL for tasks assessed/performed Overall Cognitive Status: Within Functional Limits for tasks assessed                                          General Comments      Exercises Other Exercises Other Exercises: Role of PT in acute setting, equipments recs   Assessment/Plan    PT Assessment Patient needs continued PT services  PT Problem List Decreased strength;Decreased range of motion;Decreased knowledge of use of DME;Decreased activity tolerance;Decreased balance;Decreased safety awareness       PT Treatment Interventions DME instruction;Balance training;Gait training;Neuromuscular re-education;Stair training;Functional mobility training;Patient/family education;Therapeutic activities;Therapeutic exercise    PT Goals (Current goals can be found in the Care Plan section)  Acute  Rehab PT Goals Patient Stated Goal: to go home back with hospice care PT Goal Formulation: With patient Time For Goal Achievement: 10/18/22 Potential to Achieve Goals: Good    Frequency Min 2X/week     Co-evaluation PT/OT/SLP Co-Evaluation/Treatment: Yes Reason for Co-Treatment: Complexity of the patient's impairments (multi-system involvement);For patient/therapist safety;To address functional/ADL transfers PT goals addressed during session: Mobility/safety with mobility;Balance;Proper use of DME OT goals addressed during session: ADL's and self-care;Strengthening/ROM       AM-PAC PT "6 Clicks" Mobility  Outcome Measure Help needed turning from your back to your side while in a flat bed without using bedrails?: A Lot Help needed moving from lying on your back to sitting on the side of a flat bed without using bedrails?: A Lot Help needed moving to and from a bed to a chair (including a wheelchair)?: A Lot Help needed standing up from a chair using your arms (e.g., wheelchair or bedside chair)?: A Little Help needed to walk in hospital room?: A Lot Help needed climbing 3-5 steps with a railing? : Total 6 Click Score: 12    End of Session Equipment Utilized During Treatment: Gait belt Activity Tolerance: Patient tolerated treatment well Patient left: in bed;with call bell/phone within reach;with bed alarm set;with family/visitor present Nurse Communication: Mobility status PT Visit Diagnosis: Difficulty in walking, not elsewhere classified (R26.2);Muscle weakness (generalized) (M62.81)    Time: 1610-9604 PT Time Calculation (  min) (ACUTE ONLY): 24 min   Charges:   PT Evaluation $PT Eval Moderate Complexity: 1 Mod PT Treatments $Therapeutic Activity: 8-22 mins        Javohn Basey M. Fairly IV, PT, DPT Physical Therapist- [facility]  10/04/2022, 2:44 PM

## 2022-10-04 NOTE — Progress Notes (Signed)
[facility]) Hospitalized hospice patient visit   Kathryn Maddox is a current hospice patient with a terminal diagnosis of malignant neoplasm of the endometrium. She began experiencing weakness and shortness of breath and presented to ED via EMS for evaluation. She was admitted to the hospital with a diagnosis of symptomatic anemia on 4.14.24. [facility] was not notified of this transfer until 4.15.24. Per Dr. Patric Dykes with [facility] this is a related hospital admission.   Met with patient and her friends, Francene Finders and Celesta Aver. Patient and friends at bedside expressed concerns about the patient being able to care for herself upon discharge.  [facility] discussed several alternate discharge locations where services could be provided (long term care).  [facility] staff emphasized the Hospice Medicare Benefit and ability to be involved if life extending options were to be explored.  All voiced understanding, and goals at this time are to seek long term care once medically stable for discharge.  Friends plan to tour several facilities to begin placement process.  [facility] notified TOC for request to begin long term care process and interest in speaking to [facility] one on one.  [facility] staff has also reached out to [facility] team for bed availability in the community.   Patient is inpatient appropriate at this time due to need for blood transfusions and IV pain medications.       Vitals:   10/04/22 0458 10/04/22 0814  BP: (!) 110/56 (!) 116/54  Pulse: 80 88  Resp: 18 18  Temp: 98.7 F (37.1 C) 98.1 F (36.7 C)  SpO2: 98% 93%      I&O: Intake/Output      04/15 0701 04/16 0700 04/16 0701 04/17 0700   P.O. 240    Blood     Total Intake(mL/kg) 240 (1.7)    Urine (mL/kg/hr)  400 (0.4)   Total Output  400   Net +240 -400     Abnormal Labs:   Latest Reference Range & Units 10/04/22 04:20  BASIC METABOLIC PANEL  Rpt !  Sodium 135 - 145 mmol/L 130 (L)      Chloride 98 - 111 mmol/L 95 (L)       Glucose 70 - 99 mg/dL 409 (H)          Calcium 8.9 - 10.3 mg/dL 7.6 (L)              RBC 3.87 - 5.11 MIL/uL 3.43 (L)  Hemoglobin 12.0 - 15.0 g/dL 8.4 (L)  HCT 81.1 - 91.4 % 26.5 (L)  MCV 80.0 - 100.0 fL 77.3 (L)  MCH 26.0 - 34.0 pg 24.5 (L)      RDW 11.5 - 15.5 % 18.3 (H)  Platelets 150 - 400 K/uL 487 (H)                                      Monocyte # 0.1 - 1.0 K/uL 1.4 (H)          Abs Immature Granulocytes 0.00 - 0.07 K/uL 0.10 (H)  Diagnostics: n/a  IV/PRN Meds: HYDROmorphone (DILAUDID) injection, 1 mg, Q3H PRN (3 doses received as of 1:42pm) ondansetron, 4 mg, Q6H PRN  Or ondansetron (ZOFRAN) IV, 4 mg, Q6H PRN  Problem List:  Symptomatic anemia Patient is presenting with gradually worsening generalized weakness that has significantly worsened since experiencing heavy vaginal bleeding.  History of chronic anemia with acute worsening with  hemoglobin of 6.5 at this time.  Previously 8.0 approximately 3 weeks ago.  Patient states her goal is to improve her strength to be able to go home with home hospice.  She consents to blood transfusion.  In addition, if indicated, she consents to IV iron as well.   - 1 unit of packed RBC ordered - Posttransfusion CBC - Iron and TIBC pending   Generalized weakness Multifactorial in the setting of endometrial cancer with metastasis, poor nutrition, acute on chronic anemia, and deconditioning.  Patient is currently followed by hospice, however I am concerned she may need a higher level of care without some improvement in her weakness.   - Palliative consulted to assist with goals of care and appropriate discharge planning - PT/OT - Consider appetite stimulant   Hypoalbuminemia Patient with bilateral lower extremity edema, likely in the setting of hypoalbuminemia.  Received one-time dose of Lasix in the ED.   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  Pleurx catheter in place with no requirement for drainage at this time   Type 2 diabetes mellitus, without long-term current use of insulin - Hold home antiglycemic agents - SSI   OSA (obstructive sleep apnea) - CPAP at bedtime    Discharge Planning:  On going goal is for LTC placement.  Family Contact: Britta Mccreedy and [place].  Refer to the above for details.     IDT:  Updated   Goals of Care:  Ongoing.    Redge Gainer [facility] Liaison  [facility]

## 2022-10-04 NOTE — TOC Progression Note (Addendum)
Transition of Care [facility]) - Progression Note    Patient Details  Name: Kathryn Maddox MRN: [id number] Date of Birth: [birth year]  Transition of Care [facility]) CM/SW Contact  Allena Katz, LCSW Phone Number: 10/04/2022, 3:05 PM  Clinical Narrative:   Pt spoke with patient and family and provided family with medicare.gov list of LTC facilities. Family only wants to go to [facility] for LTC. CSW to send referral   3:17PM Referral faxed to [facility].          Expected Discharge Plan and Services                                               Social Determinants of Health (SDOH) Interventions SDOH Screenings   Food Insecurity: No Food Insecurity (10/02/2022)  Housing: Low Risk  (10/02/2022)  Transportation Needs: No Transportation Needs (10/02/2022)  Utilities: At Risk (10/02/2022)  Alcohol Screen: Low Risk  (05/20/2022)  Depression (PHQ2-9): Low Risk  (05/20/2022)  Financial Resource Strain: Low Risk  (05/20/2022)  Physical Activity: Inactive (05/20/2022)  Social Connections: Socially Isolated (05/20/2022)  Stress: Stress Concern Present (05/20/2022)  Tobacco Use: Low Risk  (10/02/2022)    Readmission Risk Interventions     No data to display

## 2022-10-04 NOTE — NC FL2 (Signed)
[place] MEDICAID FL2 LEVEL OF CARE FORM     IDENTIFICATION  Patient Name: Kathryn Maddox Birthdate: 1951-06-16 Sex: female Admission Date (Current Location): 10/02/2022  [facility] and [place] Number:  Chiropodist and Address:  [facility], [address] [place], [place] [address]      Provider Number: [id number]  Attending Physician Name and Address:  Lurene Shadow, MD  Relative Name and Phone Number:  Mina Marble) [phone]    Current Level of Care: Hospital Recommended Level of Care:  (LTC) Prior Approval Number:    Date Approved/Denied:   PASRR Number: [id number]  Discharge Plan:      Current Diagnoses: Patient Active Problem List   Diagnosis Date Noted   Symptomatic anemia 10/02/2022   Hypoalbuminemia 10/02/2022   Back pain 10/02/2022   Hypomagnesemia 06/21/2022   Generalized weakness 05/18/2022   Poor fluid intake 05/04/2022   Goals of care, counseling/discussion 05/04/2022   Essential hypertension 09/08/2021   OSA (obstructive sleep apnea) 09/08/2021   Endometrial cancer 08/26/2020   Diabetic peripheral neuropathy 12/06/2017   Obesity, morbid (more than 100 lbs over ideal weight or BMI > 40) 12/06/2017   Type 2 diabetes mellitus, without long-term current use of insulin 12/06/2017    Orientation RESPIRATION BLADDER Height & Weight     Self, Time, Situation, Place  O2 Incontinent Weight: (!) 304 lb 7.3 oz (138.1 kg) Height:   (154.9 cm)  BEHAVIORAL SYMPTOMS/MOOD NEUROLOGICAL BOWEL NUTRITION STATUS      Continent Diet (Regular)  AMBULATORY STATUS COMMUNICATION OF NEEDS Skin   Extensive Assist Verbally Normal                       Personal Care Assistance Level of Assistance  Bathing, Feeding, Dressing Bathing Assistance: Maximum assistance Feeding assistance: Maximum assistance Dressing Assistance: Maximum assistance     Functional Limitations Info  Sight, Hearing, Speech Sight  Info: Adequate Hearing Info: Adequate Speech Info: Adequate    SPECIAL CARE FACTORS FREQUENCY                       Contractures Contractures Info: Not present    Additional Factors Info  Code Status, Allergies Code Status Info: DNR Allergies Info: Megace (Megestrol)  Penicillins  Ibuprofen  Pravastatin           Current Medications (10/04/2022):  This is the current hospital active medication list Current Facility-Administered Medications  Medication Dose Route Frequency Provider Last Rate Last Admin   0.9 %  sodium chloride infusion  10 mL/hr Intravenous Once Chesley Noon, MD       acetaminophen (TYLENOL) tablet 1,000 mg  1,000 mg Oral Q6H Verdene Lennert, MD   1,000 mg at 10/04/22 0914   furosemide (LASIX) injection 40 mg  40 mg Intravenous Once Chesley Noon, MD       HYDROmorphone (DILAUDID) injection 1 mg  1 mg Intravenous Q3H PRN Verdene Lennert, MD   1 mg at 10/04/22 1210   insulin aspart (novoLOG) injection 0-9 Units  0-9 Units Subcutaneous TID WC Verdene Lennert, MD       ondansetron (ZOFRAN) tablet 4 mg  4 mg Oral Q6H PRN Verdene Lennert, MD       Or   ondansetron (ZOFRAN) injection 4 mg  4 mg Intravenous Q6H PRN Verdene Lennert, MD       sodium chloride flush (NS) 0.9 % injection 3 mL  3 mL Intravenous Q12H Verdene Lennert, MD  3 mL at 10/03/22 2113     Discharge Medications: Please see discharge summary for a list of discharge medications.  Relevant Imaging Results:  Relevant Lab Results:   Additional Information SS [id number]  Allena Katz, LCSW

## 2022-10-04 NOTE — Evaluation (Signed)
Occupational Therapy Evaluation Patient Details Name: Kathryn Maddox MRN: [id number] DOB: Jun 11, 1951 Today's Date: 10/04/2022   History of Present Illness Kathryn Maddox is a 72 y.o. female with medical history significant of stage III endometrial cancer complicated by pleural effusions s/p Pleurx drain, type 2 diabetes, hypertension, hyperlipidemia, morbid obesity, OSA on CPAP, who presents to the ED with complaints of generalized weakness.   Clinical Impression   Patient presenting with decreased Ind in self care,balance, functional moblity/transfers, endurance, and safety awareness. Patient reports living at home alone but with several people coming to assist with ADLs and IADLs throughout the week. Her neighbor reports pt has been needing +2 assistance for bed mobility and functional transfer recently. Pt needing +2 assistance for bed mobility, sit <>stand, and side steps this session. OT discussed equipment needs to decrease caregiver burden and increase pt safety at discharge. Patient will benefit from acute OT to increase overall independence in the areas of ADLs, functional mobility, and safety awareness in order to safely discharge.      Recommendations for follow up therapy are one component of a multi-disciplinary discharge planning process, led by the attending physician.  Recommendations may be updated based on patient status, additional functional criteria and insurance authorization.   Assistance Recommended at Discharge Frequent or constant Supervision/Assistance  Patient can return home with the following Two people to help with bathing/dressing/bathroom;Two people to help with walking and/or transfers;Assistance with cooking/housework;Assist for transportation;Help with stairs or ramp for entrance    Functional Status Assessment  Patient has had a recent decline in their functional status and demonstrates the ability to make significant improvements in function in a reasonable  and predictable amount of time.  Equipment Recommendations  Other (comment);Hospital bed (bariatric drop arm commode chair)       Precautions / Restrictions Precautions Precautions: Fall Restrictions Weight Bearing Restrictions: No      Mobility Bed Mobility Overal bed mobility: Needs Assistance Bed Mobility: Supine to Sit, Sit to Supine     Supine to sit: Mod assist, +2 for physical assistance, HOB elevated Sit to supine: Max assist, +2 for physical assistance        Transfers Overall transfer level: Needs assistance Equipment used: Rolling walker (2 wheels) Transfers: Sit to/from Stand Sit to Stand: Min guard           General transfer comment: VC's for hand placement      Balance Overall balance assessment: Needs assistance Sitting-balance support: No upper extremity supported, Feet supported       Standing balance support: Bilateral upper extremity supported Standing balance-Leahy Scale: Poor Standing balance comment: significant BUE support on AD                           ADL either performed or assessed with clinical judgement   ADL Overall ADL's : Needs assistance/impaired                                       General ADL Comments: max- total A and per caregiver in room she is likely close to baseline with self care needs/.     Vision Patient Visual Report: No change from baseline              Pertinent Vitals/Pain Pain Assessment Pain Assessment: Faces Faces Pain Scale: No hurt     Hand Dominance Right   Extremity/Trunk  Assessment Upper Extremity Assessment Upper Extremity Assessment: Generalized weakness   Lower Extremity Assessment Lower Extremity Assessment: Generalized weakness   Cervical / Trunk Assessment Cervical / Trunk Assessment: Normal   Communication Communication Communication: No difficulties   Cognition Arousal/Alertness: Awake/alert Behavior During Therapy: WFL for tasks  assessed/performed Overall Cognitive Status: Within Functional Limits for tasks assessed                                                  Home Living Family/patient expects to be discharged to:: Private residence Living Arrangements: Alone Available Help at Discharge: Available PRN/intermittently;Personal care attendant Type of Home: House Home Access: Stairs to enter [employer] of Steps: 1 from garage   Home Layout: One level     Bathroom Shower/Tub: Walk-in shower;Sponge bathes at baseline         Home Equipment: Agricultural consultant (2 wheels);Rollator (4 wheels)          Prior Functioning/Environment Prior Level of Function : Needs assist       Physical Assist : Mobility (physical);ADLs (physical) Mobility (physical): Bed mobility;Transfers;Gait ADLs (physical): Feeding;Grooming;Bathing;Dressing;Toileting Mobility Comments: requires 2 person assist at baseline and RW ADLs Comments: Pt has an aide that comes 2x/wk and someone that comes 3x wk to clean her home and assist with ADLs. Neighbor is very involved with care.        OT Problem List: Decreased strength;Decreased activity tolerance;Decreased safety awareness;Impaired balance (sitting and/or standing);Decreased knowledge of use of DME or AE      OT Treatment/Interventions: Self-care/ADL training;Therapeutic exercise;Therapeutic activities;DME and/or AE instruction;Patient/family education;Balance training;Energy conservation    OT Goals(Current goals can be found in the care plan section) Acute Rehab OT Goals Patient Stated Goal: to have needed equipment for home OT Goal Formulation: With patient/family Time For Goal Achievement: 10/18/22 Potential to Achieve Goals: Good ADL Goals Pt Will Perform Grooming: with supervision;sitting Pt Will Transfer to Toilet: with mod assist;stand pivot transfer Pt Will Perform Toileting - Clothing Manipulation and hygiene: with mod assist;sit  to/from stand Pt/caregiver will Perform Home Exercise Program: Both right and left upper extremity;With theraband;Increased strength;With written HEP provided;With Supervision  OT Frequency: Min 1X/week    Co-evaluation PT/OT/SLP Co-Evaluation/Treatment: Yes Reason for Co-Treatment: Complexity of the patient's impairments (multi-system involvement);For patient/therapist safety;To address functional/ADL transfers PT goals addressed during session: Mobility/safety with mobility;Balance;Proper use of DME OT goals addressed during session: ADL's and self-care;Strengthening/ROM      AM-PAC OT "6 Clicks" Daily Activity     Outcome Measure Help from another person eating meals?: A Little Help from another person taking care of personal grooming?: A Lot Help from another person toileting, which includes using toliet, bedpan, or urinal?: Total Help from another person bathing (including washing, rinsing, drying)?: Total Help from another person to put on and taking off regular upper body clothing?: A Lot Help from another person to put on and taking off regular lower body clothing?: Total 6 Click Score: 10   End of Session Equipment Utilized During Treatment: Rolling walker (2 wheels) Nurse Communication: Mobility status  Activity Tolerance: Patient tolerated treatment well Patient left: in bed;with call bell/phone within reach;with bed alarm set  OT Visit Diagnosis: Unsteadiness on feet (R26.81);Repeated falls (R29.6);Muscle weakness (generalized) (M62.81)                Time: 0454-0981 OT Time Calculation (min):  25 min Charges:  OT General Charges $OT Visit: 1 Visit OT Evaluation $OT Eval Moderate Complexity: 1 [address] [place], OTR/L , CBIS ascom [phone]  10/04/22, 3:59 PM

## 2022-10-05 ENCOUNTER — Ambulatory Visit: Payer: PPO | Admitting: Physical Therapy

## 2022-10-05 DIAGNOSIS — D649 Anemia, unspecified: Secondary | ICD-10-CM | POA: Diagnosis not present

## 2022-10-05 LAB — CBC WITH DIFFERENTIAL/PLATELET
Abs Immature Granulocytes: 0.11 10*3/uL — ABNORMAL HIGH (ref 0.00–0.07)
Basophils Absolute: 0 10*3/uL (ref 0.0–0.1)
Basophils Relative: 0 %
Eosinophils Absolute: 0.1 10*3/uL (ref 0.0–0.5)
Eosinophils Relative: 1 %
HCT: 26 % — ABNORMAL LOW (ref 36.0–46.0)
Hemoglobin: 8.3 g/dL — ABNORMAL LOW (ref 12.0–15.0)
Immature Granulocytes: 1 %
Lymphocytes Relative: 9 %
Lymphs Abs: 0.7 10*3/uL (ref 0.7–4.0)
MCH: 24.6 pg — ABNORMAL LOW (ref 26.0–34.0)
MCHC: 31.9 g/dL (ref 30.0–36.0)
MCV: 76.9 fL — ABNORMAL LOW (ref 80.0–100.0)
Monocytes Absolute: 1.5 10*3/uL — ABNORMAL HIGH (ref 0.1–1.0)
Monocytes Relative: 17 %
Neutro Abs: 6.1 10*3/uL (ref 1.7–7.7)
Neutrophils Relative %: 72 %
Platelets: 470 10*3/uL — ABNORMAL HIGH (ref 150–400)
RBC: 3.38 MIL/uL — ABNORMAL LOW (ref 3.87–5.11)
RDW: 19.1 % — ABNORMAL HIGH (ref 11.5–15.5)
WBC: 8.5 10*3/uL (ref 4.0–10.5)
nRBC: 0 % (ref 0.0–0.2)

## 2022-10-05 LAB — BASIC METABOLIC PANEL
Anion gap: 7 (ref 5–15)
BUN: 20 mg/dL (ref 8–23)
CO2: 26 mmol/L (ref 22–32)
Calcium: 7.5 mg/dL — ABNORMAL LOW (ref 8.9–10.3)
Chloride: 93 mmol/L — ABNORMAL LOW (ref 98–111)
Creatinine, Ser: 0.55 mg/dL (ref 0.44–1.00)
GFR, Estimated: >60 mL/min (ref >60)
Glucose, Bld: 112 mg/dL — ABNORMAL HIGH (ref 70–99)
Potassium: 4.3 mmol/L (ref 3.5–5.1)
Sodium: 126 mmol/L — ABNORMAL LOW (ref 135–145)

## 2022-10-05 LAB — GLUCOSE, CAPILLARY
Glucose-Capillary: 99 mg/dL (ref 70–99)
Glucose-Capillary: 105 mg/dL — ABNORMAL HIGH (ref 70–99)
Glucose-Capillary: 87 mg/dL (ref 70–99)
Glucose-Capillary: 98 mg/dL (ref 70–99)

## 2022-10-05 MED ORDER — SODIUM CHLORIDE 0.9 % IV SOLN
300.0000 mg | Freq: Once | INTRAVENOUS | Status: AC
  Administered 2022-10-05: 300 mg via INTRAVENOUS
  Filled 2022-10-05: qty 300

## 2022-10-05 MED ORDER — SENNOSIDES-DOCUSATE SODIUM 8.6-50 MG PO TABS
1.0000 | ORAL_TABLET | Freq: Two times a day (BID) | ORAL | Status: DC
  Administered 2022-10-05 – 2022-10-07 (×5): 1 via ORAL
  Filled 2022-10-05 (×5): qty 1

## 2022-10-05 MED ORDER — MORPHINE SULFATE (CONCENTRATE) 10 MG /0.5 ML PO SOLN: 5.0000 mg | ORAL | Status: DC | PRN

## 2022-10-05 MED ORDER — POLYETHYLENE GLYCOL 3350 17 G PO PACK
17.0000 g | PACK | Freq: Every day | ORAL | Status: DC | PRN
  Administered 2022-10-05: 17 g via ORAL
  Filled 2022-10-05: qty 1

## 2022-10-05 NOTE — Progress Notes (Signed)
Physical Therapy Treatment Patient Details Name: Kathryn Maddox MRN: [id number] DOB: 07/11/1950 Today's Date: 10/05/2022   History of Present Illness Kathryn Maddox is a 72 y.o. female with medical history significant of stage III endometrial cancer complicated by pleural effusions s/p Pleurx drain, type 2 diabetes, hypertension, hyperlipidemia, morbid obesity, OSA on CPAP, who presents to the ED with complaints of generalized weakness.    PT Comments    Pt received upright in bed agreeable to PT services. Having LBP and nausea so agreeable to minimal participation. Tolerating LE therex as listed below. Bed noted to be soiled thus reliant on NT and PT support with maxA+2 multiple rolling R/L for linens change and dependent pericare from NT.  Pt positioned upright in bed with aall needs in reach. Educated family on general LE therex for AROM, edema control, and strengthening during admission. LE's elevated with x2 pillows. Continue to recommend needed DME upon d/c to maximize pt's independence and reduced care giver burden.    Recommendations for follow up therapy are one component of a multi-disciplinary discharge planning process, led by the attending physician.  Recommendations may be updated based on patient status, additional functional criteria and insurance authorization.  Assistance Recommended at Discharge Frequent or constant Supervision/Assistance  Patient can return home with the following Two people to help with walking and/or transfers;Two people to help with bathing/dressing/bathroom;Assistance with cooking/housework;Help with stairs or ramp for entrance   Equipment Recommendations  Hospital bed (drop arm bariatric BSC)    Recommendations for Other Services       Precautions / Restrictions Precautions Precautions: Fall Restrictions Weight Bearing Restrictions: No     Mobility  Bed Mobility Overal bed mobility: Needs Assistance Bed Mobility: Rolling Rolling: Max assist,  +2 for physical assistance           Patient Response: Cooperative  Transfers                        Ambulation/Gait                   Stairs             Wheelchair Mobility    Modified Rankin (Stroke Patients Only)       Balance                                            Cognition                                                Exercises General Exercises - Lower Extremity Ankle Circles/Pumps: AROM, Strengthening, Both Short Arc Quad: AROM, Strengthening, Both, 10 reps, Supine Hip ABduction/ADduction: AAROM, Supine, Strengthening, Both, 10 reps Straight Leg Raises: AAROM, Strengthening, Both, 10 reps, Supine    General Comments        Pertinent Vitals/Pain Pain Assessment Pain Assessment: Faces Faces Pain Scale: Hurts whole lot Pain Location: lower back Pain Descriptors / Indicators: Grimacing Pain Intervention(s): Limited activity within patient's tolerance, Monitored during session, Repositioned    Home Living Family/patient expects to be discharged to:: Private residence Living Arrangements: Alone Available Help at Discharge: Available PRN/intermittently;Personal care attendant  Prior Function            PT Goals (current goals can now be found in the care plan section) Acute Rehab PT Goals Patient Stated Goal: to go home back with hospice care PT Goal Formulation: With patient Time For Goal Achievement: 10/18/22 Potential to Achieve Goals: Good Progress towards PT goals: Progressing toward goals    Frequency    Min 2X/week      PT Plan Current plan remains appropriate    Co-evaluation              AM-PAC PT "6 Clicks" Mobility   Outcome Measure  Help needed turning from your back to your side while in a flat bed without using bedrails?: A Lot Help needed moving from lying on your back to sitting on the side of a flat bed without using  bedrails?: A Lot Help needed moving to and from a bed to a chair (including a wheelchair)?: A Lot Help needed standing up from a chair using your arms (e.g., wheelchair or bedside chair)?: A Little Help needed to walk in hospital room?: A Lot Help needed climbing 3-5 steps with a railing? : Total 6 Click Score: 12    End of Session   Activity Tolerance: Patient limited by pain Patient left: in bed;with call bell/phone within reach;with bed alarm set;with family/visitor present Nurse Communication: Mobility status PT Visit Diagnosis: Difficulty in walking, not elsewhere classified (R26.2);Muscle weakness (generalized) (M62.81)     Time: 9147-8295 PT Time Calculation (min) (ACUTE ONLY): 34 min  Charges:  $Therapeutic Exercise: 8-22 mins $Therapeutic Activity: 8-22 mins                    Orlin Kann M. Fairly IV, PT, DPT Physical Therapist- [place]  [facility]  10/05/2022, 3:38 PM

## 2022-10-05 NOTE — Progress Notes (Signed)
[facility]) Hospitalized Hospice patient visit   Ms. Hottle is a current hospice patient with a terminal diagnosis of malignant neoplasm of the endometrium. She began experiencing weakness and shortness of breath and presented to ED via EMS for evaluation. She was admitted to the hospital with a diagnosis of symptomatic anemia on 4.14.24. [facility] was not notified of this transfer until 4.15.24. Per Dr. Patric Dykes with [facility] this is a related hospital admission.    Visited with patient and friend at bedside. Patient was receiving IV Iron transfusion and was fixated on this and not able to carry on any further conversation. Darryll Capers reports that patient is considering transitioning into a long term care facility, [facility]. Discussed that if this is decided she will need to revoke services through [facility] does not service that facility. She verbalized understanding.   Patient is inpatient appropriate at this time due to need for IV Iron transfusion and IV pain medications.    Vital Signs- 98.3/73/21   102/61    93% room air  I&O- 0/300 Abnormal Labs- Na+ 126, Chloride 93, Ca+ 7.5, Iron 16, RBC 3.38, Hgb 8.3, Hct 26, Platelets 470 Diagnostics- N/A   IV/PRN Meds- Dilaudid  IV x5, Iron  IV x1   Problem List- Principal Problem:   Symptomatic anemia Active Problems:   Generalized weakness   Hypoalbuminemia   Back pain   Endometrial cancer   Type 2 diabetes mellitus, without long-term current use of insulin   OSA (obstructive sleep apnea)  Discharge Planning-  Exploring possibility of LTC at [facility]- Patient is her own spokesperson IDT: Updated Goals of Care: DNR but wants treatment currently Thank you for the opportunity to participate in this patient's care, please don't hesitate to call for any hospice related questions or concerns.  Thea Gist BSN, [facility] [phone]

## 2022-10-05 NOTE — Progress Notes (Signed)
PROGRESS NOTE    Kathryn Maddox  WUJ:[id number] DOB: [birth year] DOA: 10/02/2022 PCP: Marina Goodell, MD    Brief Narrative:   Kathryn Maddox is a 72 y.o. female  with medical history significant of stage III endometrial cancer complicated by pleural effusions s/p Pleurx drain, type 2 diabetes, hypertension, hyperlipidemia, morbid obesity, OSA on CPAP, who presented to the hospital with generalized weakness, bilateral lower extremity swelling and vaginal bleeding.    Assessment & Plan:   Principal Problem:   Symptomatic anemia Active Problems:   Generalized weakness   Hypoalbuminemia   Back pain   Endometrial cancer   Type 2 diabetes mellitus, without long-term current use of insulin   OSA (obstructive sleep apnea)  ymptomatic anemia, likely from acute blood loss anemia, underlying chronic iron deficiency anemia: Hemoglobin is 8.2.  S/p transfusion with 2 units of PRBCs.  No further blood transfusion for now.  Will give dose of IV iron 300 mg x 1 on 4/17.  Start p.o. iron replacement on 4/18.     General weakness: This is likely multifactorial from endometrial cancer, anemia and deconditioning.  Her friends are concerned that she has been falling at home and she does not have any support.  They also says she does not have any equipment to help her get around.  Consult PT and OT.  Consulted Child psychotherapist to assist as well.  As for now the plan is to go to long-term care after discharge.  If this is the plan of disposition then patient will have to revoke hospice services.     Endometrial cancer with metastasis to the, vaginal bleeding: She has plus catheter in place.  She is no longer on chemotherapy.  She is on hospice care at home.  She is aware that of long-term care is the disposition plan that she will no longer be eligible for hospice services.     Hyponatremia: Sodium is better today.  Probably due to malignancy.       Chronic back pain: Analgesics as needed for pain      OSA: Continue CPAP at night.     Hypoalbuminemia, chronic lower extremity edema: Encouraged adequate oral intake     Type II DM: NovoLog as needed.  Morbid obesity: This complicates overall care and prognosis  DVT prophylaxis: SCDs Code Status: DNR Family Communication: Friend at bedside 4/17 Disposition Plan: Status is: Inpatient Remains inpatient appropriate because: Severe anemia   Level of care: Med-Surg  Consultants:  None  Procedures:  None  Antimicrobials: None   Subjective: Seen and examined.  Resting in bed.  No visible distress.  Objective: Vitals:   10/04/22 1957 10/05/22 0450 10/05/22 0728 10/05/22 1121  BP: (!) 118/48 (!) 105/42 101/80 109/63  Pulse: 92 89 89 88  Resp: Temp: (!) 97.5 F (36.4 C) 98.2 F (36.8 C) 97.8 F (36.6 C) 98.2 F (36.8 C)  TempSrc: Oral Oral    SpO2: 96% 95% 96% 96%  Weight:      Height:        Intake/Output Summary (Last 24 hours) at 10/05/2022 1533 Last data filed at 10/05/2022 0834 Gross per 24 hour  Intake --  Output 300 ml  Net -300 ml   Filed Weights   10/02/22 1134  Weight: (!) 138.1 kg    Examination:  General exam: NAD.Marland Kitchen  Frail and chronically ill Respiratory system: Clear to auscultation. Respiratory effort normal. Cardiovascular system: S1-S2, RRR, no murmurs, 1+ pedal edema Gastrointestinal  system: Obese, soft, NT/ND, normal bowel sounds Central nervous system: Alert and oriented. No focal neurological deficits. Extremities: Symmetric 5 x 5 power. Skin: No rashes, lesions or ulcers Psychiatry: Judgement and insight appear normal. Mood & affect appropriate.     Data Reviewed: I have personally reviewed following labs and imaging studies  CBC: Recent Labs  Lab 10/02/22 1136 10/02/22 1854 10/03/22 0346 10/04/22 0420 10/05/22 0545  WBC 9.8 9.4 8.8 9.1 8.5  NEUTROABS 7.3  --   --  6.7 6.1  HGB 6.5* 7.3* 7.5* 8.4* 8.3*  HCT 21.8* 23.6* 23.9* 26.5* 26.0*  MCV 75.7* 76.1*  75.9* 77.3* 76.9*  PLT 570* 498* 481* 487* 470*   Basic Metabolic Panel: Recent Labs  Lab 10/02/22 1136 10/03/22 0346 10/04/22 0420 10/05/22 0545  NA 129* 128* 130* 126*  K 4.0 4.1 4.3 4.3  CL 94* 94* 95* 93*  CO2 GLUCOSE 106* 100* 100* 112*  BUN CREATININE 0.66 0.56 0.52 0.55  CALCIUM 8.1* 7.8* 7.6* 7.5*   GFR: Estimated Creatinine Clearance: 85.4 mL/min (by C-G formula based on SCr of 0.55 mg/dL). Liver Function Tests: Recent Labs  Lab 10/02/22 1136  AST 21  ALT 8  ALKPHOS 51  BILITOT 0.7  PROT 6.2*  ALBUMIN 2.2*   No results for input(s): "LIPASE", "AMYLASE" in the last 168 hours. No results for input(s): "AMMONIA" in the last 168 hours. Coagulation Profile: No results for input(s): "INR", "PROTIME" in the last 168 hours. Cardiac Enzymes: No results for input(s): "CKTOTAL", "CKMB", "CKMBINDEX", "TROPONINI" in the last 168 hours. BNP (last 3 results) No results for input(s): "PROBNP" in the last 8760 hours. HbA1C: No results for input(s): "HGBA1C" in the last 72 hours. CBG: Recent Labs  Lab 10/04/22 1139 10/04/22 1632 10/04/22 2145 10/05/22 0736 10/05/22 1122  GLUCAP 103* 97 126* 99 105*   Lipid Profile: No results for input(s): "CHOL", "HDL", "LDLCALC", "TRIG", "CHOLHDL", "LDLDIRECT" in the last 72 hours. Thyroid Function Tests: No results for input(s): "TSH", "T4TOTAL", "FREET4", "T3FREE", "THYROIDAB" in the last 72 hours. Anemia Panel: Recent Labs    10/03/22 0340  FERRITIN 513*   Sepsis Labs: No results for input(s): "PROCALCITON", "LATICACIDVEN" in the last 168 hours.  No results found for this or any previous visit (from the past 240 hour(s)).       Radiology Studies: No results found.      Scheduled Meds:  acetaminophen  1,000 mg Oral Q6H   furosemide  40 mg Intravenous Once   senna-docusate  1 tablet Oral BID   sodium chloride flush  3 mL Intravenous Q12H   Continuous Infusions:  sodium chloride        LOS: 2 days       Tresa Moore, MD [facility]   If 7PM-7AM, please contact night-coverage  10/05/2022, 3:33 PM

## 2022-10-06 DIAGNOSIS — D649 Anemia, unspecified: Secondary | ICD-10-CM | POA: Diagnosis not present

## 2022-10-06 LAB — CBC WITH DIFFERENTIAL/PLATELET
Abs Immature Granulocytes: 0.08 10*3/uL — ABNORMAL HIGH (ref 0.00–0.07)
Basophils Absolute: 0 10*3/uL (ref 0.0–0.1)
Basophils Relative: 0 %
Eosinophils Absolute: 0 10*3/uL (ref 0.0–0.5)
Eosinophils Relative: 0 %
HCT: 28.9 % — ABNORMAL LOW (ref 36.0–46.0)
Hemoglobin: 9.2 g/dL — ABNORMAL LOW (ref 12.0–15.0)
Immature Granulocytes: 1 %
Lymphocytes Relative: 10 %
Lymphs Abs: 0.9 10*3/uL (ref 0.7–4.0)
MCH: 24.8 pg — ABNORMAL LOW (ref 26.0–34.0)
MCHC: 31.8 g/dL (ref 30.0–36.0)
MCV: 77.9 fL — ABNORMAL LOW (ref 80.0–100.0)
Monocytes Absolute: 1.4 10*3/uL — ABNORMAL HIGH (ref 0.1–1.0)
Monocytes Relative: 16 %
Neutro Abs: 6.2 10*3/uL (ref 1.7–7.7)
Neutrophils Relative %: 73 %
Platelets: 472 10*3/uL — ABNORMAL HIGH (ref 150–400)
RBC: 3.71 MIL/uL — ABNORMAL LOW (ref 3.87–5.11)
RDW: 20.1 % — ABNORMAL HIGH (ref 11.5–15.5)
WBC: 8.6 10*3/uL (ref 4.0–10.5)
nRBC: 0 % (ref 0.0–0.2)

## 2022-10-06 LAB — BASIC METABOLIC PANEL
Anion gap: 9 (ref 5–15)
BUN: 18 mg/dL (ref 8–23)
CO2: 25 mmol/L (ref 22–32)
Calcium: 7.9 mg/dL — ABNORMAL LOW (ref 8.9–10.3)
Chloride: 92 mmol/L — ABNORMAL LOW (ref 98–111)
Creatinine, Ser: 0.51 mg/dL (ref 0.44–1.00)
GFR, Estimated: >60 mL/min (ref >60)
Glucose, Bld: 88 mg/dL (ref 70–99)
Potassium: 4.7 mmol/L (ref 3.5–5.1)
Sodium: 126 mmol/L — ABNORMAL LOW (ref 135–145)

## 2022-10-06 LAB — GLUCOSE, CAPILLARY: Glucose-Capillary: 79 mg/dL (ref 70–99)

## 2022-10-06 MED ORDER — FERROUS SULFATE 325 (65 FE) MG PO TABS
325.0000 mg | ORAL_TABLET | Freq: Every day | ORAL | Status: DC
  Administered 2022-10-06 – 2022-10-07 (×2): 325 mg via ORAL
  Filled 2022-10-06 (×2): qty 1

## 2022-10-06 NOTE — Progress Notes (Signed)
PROGRESS NOTE    [name]  WUJ:[id number] DOB: Jul 28, 1950 DOA: 10/02/2022 PCP: Marina Goodell, MD    Brief Narrative:   Kathryn Maddox is a 72 y.o. female  with medical history significant of stage III endometrial cancer complicated by pleural effusions s/p Pleurx drain, type 2 diabetes, hypertension, hyperlipidemia, morbid obesity, OSA on CPAP, who presented to the hospital with generalized weakness, bilateral lower extremity swelling and vaginal bleeding.    Assessment & Plan:   Principal Problem:   Symptomatic anemia Active Problems:   Generalized weakness   Hypoalbuminemia   Back pain   Endometrial cancer   Type 2 diabetes mellitus, without long-term current use of insulin   OSA (obstructive sleep apnea)  Symptomatic anemia, likely from acute blood loss anemia, underlying chronic iron deficiency anemia: Hemoglobin is 8.2.  S/p transfusion with 2 units of PRBCs.  No further blood transfusion for now.  Received IV Venofer 300 mg x 1 on 4/17.  Start p.o. iron replacement today.      General weakness: This is likely multifactorial from endometrial cancer, anemia and deconditioning.  Her friends are concerned that she has been falling at home and she does not have any support.  They also says she does not have any equipment to help her get around.  Consult PT and OT.  Consulted Child psychotherapist to assist as well.  As for now the plan is to go to long-term care after discharge.  First choice [facility] may not be an option.  TOC and hospice liaison to follow-up with disposition plan.     Endometrial cancer with metastasis to the, vaginal bleeding: She has Pleurx catheter in place.  She is no longer on chemotherapy.  She is on hospice care at home.  She is aware that of long-term care is the disposition plan that she will no longer be eligible for hospice services.     Hyponatremia: Sodium is better today.  Probably due to malignancy.       Chronic back pain: Analgesics as  needed for pain     OSA: Continue CPAP at night.     Hypoalbuminemia, chronic lower extremity edema: Encouraged adequate oral intake     Type II DM: NovoLog as needed.  Morbid obesity: This complicates overall care and prognosis  DVT prophylaxis: SCDs Code Status: DNR Family Communication: Friend at bedside 4/17 Disposition Plan: Status is: Inpatient Remains inpatient appropriate because: Severe anemia   Level of care: Med-Surg  Consultants:  None  Procedures:  None  Antimicrobials: None   Subjective: Seen and examined.  Resting in bed.  Appears anxious  Objective: Vitals:   10/05/22 1800 10/05/22 2003 10/06/22 0451 10/06/22 0817  BP:  103/74 (!) 114/52 (!) 110/42  Pulse:  (!) 105 81 85  Resp: Temp:  98.3 F (36.8 C)  98.5 F (36.9 C)  TempSrc:      SpO2:  96% 98% 98%  Weight:      Height:       No intake or output data in the 24 hours ending 10/06/22 1326  Filed Weights   10/02/22 1134  Weight: (!) 138.1 kg    Examination:  General exam: Anxious appearing Respiratory system: Lungs clear.  Normal work of breathing.  Room air Cardiovascular system: S1-S2, RRR, no murmurs, 1+ pedal edema Gastrointestinal system: Obese, soft, NT/ND, normal bowel sounds Central nervous system: Alert and oriented. No focal neurological deficits. Extremities: Symmetric 5 x 5 power. Skin: No  rashes, lesions or ulcers Psychiatry: Judgement and insight appear normal. Mood & affect appropriate.     Data Reviewed: I have personally reviewed following labs and imaging studies  CBC: Recent Labs  Lab 10/02/22 1136 10/02/22 1854 10/03/22 0346 10/04/22 0420 10/05/22 0545 10/06/22 0754  WBC 9.8 9.4 8.8 9.1 8.5 8.6  NEUTROABS 7.3  --   --  6.7 6.1 6.2  HGB 6.5* 7.3* 7.5* 8.4* 8.3* 9.2*  HCT 21.8* 23.6* 23.9* 26.5* 26.0* 28.9*  MCV 75.7* 76.1* 75.9* 77.3* 76.9* 77.9*  PLT 570* 498* 481* 487* 470* 472*   Basic Metabolic Panel: Recent Labs  Lab  10/02/22 1136 10/03/22 0346 10/04/22 0420 10/05/22 0545 10/06/22 0754  NA 129* 128* 130* 126* 126*  K 4.0 4.1 4.3 4.3 4.7  CL 94* 94* 95* 93* 92*  CO2 GLUCOSE 106* 100* 100* 112* 88  BUN CREATININE 0.66 0.56 0.52 0.55 0.51  CALCIUM 8.1* 7.8* 7.6* 7.5* 7.9*   GFR: Estimated Creatinine Clearance: 85.4 mL/min (by C-G formula based on SCr of 0.51 mg/dL). Liver Function Tests: Recent Labs  Lab 10/02/22 1136  AST 21  ALT 8  ALKPHOS 51  BILITOT 0.7  PROT 6.2*  ALBUMIN 2.2*   No results for input(s): "LIPASE", "AMYLASE" in the last 168 hours. No results for input(s): "AMMONIA" in the last 168 hours. Coagulation Profile: No results for input(s): "INR", "PROTIME" in the last 168 hours. Cardiac Enzymes: No results for input(s): "CKTOTAL", "CKMB", "CKMBINDEX", "TROPONINI" in the last 168 hours. BNP (last 3 results) No results for input(s): "PROBNP" in the last 8760 hours. HbA1C: No results for input(s): "HGBA1C" in the last 72 hours. CBG: Recent Labs  Lab 10/05/22 0736 10/05/22 1122 10/05/22 1545 10/05/22 2003 10/06/22 0818  GLUCAP 99 105* 87 98 79   Lipid Profile: No results for input(s): "CHOL", "HDL", "LDLCALC", "TRIG", "CHOLHDL", "LDLDIRECT" in the last 72 hours. Thyroid Function Tests: No results for input(s): "TSH", "T4TOTAL", "FREET4", "T3FREE", "THYROIDAB" in the last 72 hours. Anemia Panel: No results for input(s): "VITAMINB12", "FOLATE", "FERRITIN", "TIBC", "IRON", "RETICCTPCT" in the last 72 hours.  Sepsis Labs: No results for input(s): "PROCALCITON", "LATICACIDVEN" in the last 168 hours.  No results found for this or any previous visit (from the past 240 hour(s)).       Radiology Studies: No results found.      Scheduled Meds:  acetaminophen  1,000 mg Oral Q6H   senna-docusate  1 tablet Oral BID   sodium chloride flush  3 mL Intravenous Q12H   Continuous Infusions:  sodium chloride       LOS: 3 days        Tresa Moore, MD [facility]   If 7PM-7AM, please contact night-coverage  10/06/2022, 1:26 PM

## 2022-10-06 NOTE — Progress Notes (Signed)
[facility]) Hospitalized hospice patient visit     Kathryn Maddox is a current hospice patient with a terminal diagnosis of malignant neoplasm of the endometrium. She began experiencing weakness and shortness of breath and presented to ED via EMS for evaluation. She was admitted to the hospital with a diagnosis of symptomatic anemia on 4.14.24. [facility] was not notified of this transfer until 4.15.24. Per Dr. Patric Dykes with [facility] this is a related hospital admission.   At bedside, patient reported that she will be transported to [facility], however, after a conversation with the [facility], [facility] was unable to offer a bed. At this time, patient is looking for a bed at [facility].     Patient is inpatient appropriate at this time due to need for IV pain medications.                Vitals:   10/06/22 0451 10/06/22 0817  BP: (!) 114/52 (!) 110/42  Pulse: 81 85  Resp: 20 18  Temp:  98.5 F (36.9 C)  SpO2: 98% 98%      Intake/Output      04/17 0701 04/18 0700 04/18 0701 04/19 0700   Urine (mL/kg/hr) 300 (0.1)    Total Output 300    Net -300         Urine Occurrence 3 x      Latest Reference Range & Units 10/06/22 07:54  Sodium 135 - 145 mmol/L 126 (L)  Potassium 3.5 - 5.1 mmol/L 4.7  Chloride 98 - 111 mmol/L 92 (L)                  Calcium 8.9 - 10.3 mg/dL 7.9 (L)              RBC 3.87 - 5.11 MIL/uL 3.71 (L)  Hemoglobin 12.0 - 15.0 g/dL 9.2 (L)  HCT 78.2 - 95.6 % 28.9 (L)  MCV 80.0 - 100.0 fL 77.9 (L)  MCH 26.0 - 34.0 pg 24.8 (L)  MCHC 30.0 - 36.0 g/dL 21.3  RDW 08.6 - 57.8 % 20.1 (H)  Platelets 150 - 400 K/uL 472 (H)                                      Monocyte # 0.1 - 1.0 K/uL 1.4 (H)          Abs Immature Granulocytes 0.00 - 0.07 K/uL 0.08 (H)  (L): Data is abnormally low (H): Data is abnormally high    Diagnostics: n/a   IV/PRN Meds: HYDROmorphone (DILAUDID) injection, 1 mg, Q3H PRN (2 does received at  2:21pm) ondansetron, 4 mg, Q6H PRN  Or ondansetron (ZOFRAN) IV, 4 mg, Q6H PRN   Problem List:   Symptomatic anemia Patient is presenting with gradually worsening generalized weakness that has significantly worsened since experiencing heavy vaginal bleeding.  History of chronic anemia with acute worsening with hemoglobin of 6.5 at this time.  Previously 8.0 approximately 3 weeks ago.  Patient states her goal is to improve her strength to be able to go home with home hospice.  She consents to blood transfusion.  In addition, if indicated, she consents to IV iron as well.   - 1 unit of packed RBC ordered - Posttransfusion CBC - Iron and TIBC pending   Generalized weakness Multifactorial in the setting of endometrial cancer with metastasis, poor nutrition, acute on chronic  anemia, and deconditioning.  Patient is currently followed by hospice, however I am concerned she may need a higher level of care without some improvement in her weakness.   - Palliative consulted to assist with goals of care and appropriate discharge planning - PT/OT - Consider appetite stimulant   Hypoalbuminemia Patient with bilateral lower extremity edema, likely in the setting of hypoalbuminemia.  Received one-time dose of Lasix in the ED.   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  Pleurx catheter in place with no requirement for drainage at this time   Type 2 diabetes mellitus, without long-term current use of insulin - Hold home antiglycemic agents - SSI   OSA (obstructive sleep apnea) - CPAP at bedtime     Discharge Planning:  On going goal is for LTC placement.   Family Contact: Barbara:  Left a message for her to call back if she had any questions or concerns.   IDT:  Updated   Goals of  Care:  Ongoing.    Redge Gainer [facility] Liaison  [facility]

## 2022-10-06 NOTE — Progress Notes (Signed)
Physical Therapy Treatment Patient Details Name: Kathryn Maddox MRN: [id number] DOB: [birth year] Today's Date: 10/06/2022   History of Present Illness Kathryn Maddox is a 72 y.o. female with medical history significant of stage III endometrial cancer complicated by pleural effusions s/p Pleurx drain, type 2 diabetes, hypertension, hyperlipidemia, morbid obesity, OSA on CPAP, who presents to the ED with complaints of generalized weakness.    PT Comments    Pt in room, agreeable to PT session. Pt reports feeling fine today. Pt able to come to EOB today with only HOB elevation and minA via HHA to pt's left hand. This is a big improvement since previous day. Pt able to rise to standing twice from elevated EOB, no cues needed for technique as she is fluent in an alternative technique. Pt tolerates standing twice for ~15sec without provocation of anemia symptoms. Pt tolerates several minutes of sitting EOB, no unsteadiness or LOB, pt appears safe. Pt assisted back to supine minA+1 at her request to ADL into purewick. Pt's out of town visitor arrives at end of session. All needs met. Will continue to follow.   Recommendations for follow up therapy are one component of a multi-disciplinary discharge planning process, led by the attending physician.  Recommendations may be updated based on patient status, additional functional criteria and insurance authorization.  Follow Up Recommendations  Can patient physically be transported by private vehicle: No    Assistance Recommended at Discharge Frequent or constant Supervision/Assistance  Patient can return home with the following Two people to help with walking and/or transfers;Two people to help with bathing/dressing/bathroom;Assistance with cooking/housework;Help with stairs or ramp for entrance   Equipment Recommendations       Recommendations for Other Services       Precautions / Restrictions Restrictions Weight Bearing Restrictions: No      Mobility  Bed Mobility Overal bed mobility: Needs Assistance       Supine to sit: Min assist, HOB elevated Sit to supine: Min assist        Transfers Overall transfer level: Needs assistance Equipment used:  (bariRW) Transfers: Sit to/from Stand Sit to Stand: Supervision, From elevated surface           General transfer comment: pt mandates that author provides anterior guarding/stabilziation of BRW, but actually I didn't do anything because the walker was not in need of this (I did get setup in place just in case needed)    Ambulation/Gait                   Stairs             Wheelchair Mobility    Modified Rankin (Stroke Patients Only)       Balance                                            Cognition Arousal/Alertness: Awake/alert Behavior During Therapy: WFL for tasks assessed/performed Overall Cognitive Status: Within Functional Limits for tasks assessed                                          Exercises Other Exercises Other Exercises: STS from elevated EOB x2 x15secm right elbow leaning on RW, then return to sitting Other Exercises: Side stepping at EOB to center self with bed  prior to return to supine    General Comments        Pertinent Vitals/Pain Pain Assessment Pain Assessment: No/denies pain    Home Living                          Prior Function            PT Goals (current goals can now be found in the care plan section) Acute Rehab PT Goals Patient Stated Goal: to go home back with hospice care PT Goal Formulation: With patient Time For Goal Achievement: 10/18/22 Potential to Achieve Goals: Fair Progress towards PT goals: Progressing toward goals    Frequency    Min 2X/week      PT Plan Current plan remains appropriate    Co-evaluation              AM-PAC PT "6 Clicks" Mobility   Outcome Measure  Help needed turning from your back to your side  while in a flat bed without using bedrails?: A Lot Help needed moving from lying on your back to sitting on the side of a flat bed without using bedrails?: A Lot Help needed moving to and from a bed to a chair (including a wheelchair)?: A Lot Help needed standing up from a chair using your arms (e.g., wheelchair or bedside chair)?: A Lot Help needed to walk in hospital room?: A Little Help needed climbing 3-5 steps with a railing? : Total 6 Click Score: 12    End of Session   Activity Tolerance: Patient tolerated treatment well Patient left: in bed;with call bell/phone within reach;with family/visitor present Nurse Communication: Mobility status PT Visit Diagnosis: Difficulty in walking, not elsewhere classified (R26.2);Muscle weakness (generalized) (M62.81)     Time: 1610-9604 PT Time Calculation (min) (ACUTE ONLY): 25 min  Charges:                       3:00 PM, 10/06/22 Rosamaria Lints, PT, DPT Physical Therapist - [facility]  [phone] (ASCOM)    Kathryn Maddox 10/06/2022, 2:57 PM

## 2022-10-06 NOTE — TOC Progression Note (Signed)
Transition of Care [facility]) - Progression Note    Patient Details  Name: Kathryn Maddox MRN: [id number] Date of Birth: 1950/07/03  [facility] of [facility]) CM/SW Contact  Allena Katz, LCSW Phone Number: 10/06/2022, 12:42 PM  Clinical Narrative:  CSW spoke with [facility] who states they are unable to take pt. Pt family reports they would like a referral sent to [facility].           Expected Discharge Plan and Services                                               Social Determinants of Health (SDOH) Interventions SDOH Screenings   Food Insecurity: No Food Insecurity (10/02/2022)  Housing: Low Risk  (10/02/2022)  Transportation Needs: No Transportation Needs (10/02/2022)  Utilities: At Risk (10/02/2022)  Alcohol Screen: Low Risk  (05/20/2022)  Depression (PHQ2-9): Low Risk  (05/20/2022)  Financial Resource Strain: Low Risk  (05/20/2022)  Physical Activity: Inactive (05/20/2022)  Social Connections: Socially Isolated (05/20/2022)  Stress: Stress Concern Present (05/20/2022)  Tobacco Use: Low Risk  (10/02/2022)    Readmission Risk Interventions     No data to display

## 2022-10-06 NOTE — Care Management Important Message (Signed)
Important Message  Patient Details  Name: Kathryn Maddox MRN: [id number] Date of Birth: 1950/11/15   Medicare Important Message Given:  N/A - LOS <3 / Initial given by admissions     Olegario Messier A Nelvin Tomb 10/06/2022, 8:11 AM

## 2022-10-07 ENCOUNTER — Inpatient Hospital Stay

## 2022-10-07 DIAGNOSIS — D649 Anemia, unspecified: Secondary | ICD-10-CM | POA: Diagnosis not present

## 2022-10-07 MED ORDER — MIRTAZAPINE 15 MG PO TABS: 15.0000 mg | ORAL_TABLET | Freq: Every day | ORAL | Status: DC

## 2022-10-07 MED ORDER — HYDROMORPHONE HCL 1 MG/ML IJ SOLN
0.5000 mg | Freq: Once | INTRAMUSCULAR | Status: AC
  Administered 2022-10-07: 0.5 mg via INTRAVENOUS
  Filled 2022-10-07: qty 1

## 2022-10-07 MED ORDER — LIDOCAINE 5 % EX PTCH
1.0000 | MEDICATED_PATCH | CUTANEOUS | Status: DC
  Administered 2022-10-07: 1 via TRANSDERMAL
  Filled 2022-10-07: qty 1

## 2022-10-07 NOTE — TOC Transition Note (Signed)
Transition of Care [facility]) - CM/SW Discharge Note   Patient Details  Name: Kathryn Maddox MRN: [id number] Date of Birth: [birth year]  Transition of Care [facility]) CM/SW Contact:  Allena Katz, LCSW Phone Number: 10/07/2022, 2:41 PM   Clinical Narrative:   Pt discharging to [facility]. Medical necessity printed to unit. Hospice to call transport for patient. Family notified. CSW signing off.          Patient Goals and CMS Choice      Discharge Placement                         Discharge Plan and Services Additional resources added to the After Visit Summary for                                       Social Determinants of Health (SDOH) Interventions SDOH Screenings   Food Insecurity: No Food Insecurity (10/02/2022)  Housing: Low Risk  (10/02/2022)  Transportation Needs: No Transportation Needs (10/02/2022)  Utilities: At Risk (10/02/2022)  Alcohol Screen: Low Risk  (05/20/2022)  Depression (PHQ2-9): Low Risk  (05/20/2022)  Financial Resource Strain: Low Risk  (05/20/2022)  Physical Activity: Inactive (05/20/2022)  Social Connections: Socially Isolated (05/20/2022)  Stress: Stress Concern Present (05/20/2022)  Tobacco Use: Low Risk  (10/02/2022)     Readmission Risk Interventions     No data to display

## 2022-10-07 NOTE — Progress Notes (Signed)
Occupational Therapy Treatment Patient Details Name: Kathryn Maddox MRN: [id number] DOB: Jul 21, 1950 Today's Date: 10/07/2022   History of present illness Kathryn Maddox is a 72 y.o. female with medical history significant of stage III endometrial cancer complicated by pleural effusions s/p Pleurx drain, type 2 diabetes, hypertension, hyperlipidemia, morbid obesity, OSA on CPAP, who presents to the ED with complaints of generalized weakness.   OT comments  Upon entering the room, pt supine in bed and agreeable to co-treatment with PT. Upon pulling covers back it is noted that's pt's purewick has malfunctioned and her gown and bed linens are soiled. Pt needing +2 assistance for bed mobility. Pt seated on EOB and needing assistance to doff soiled gown and wash back and buttocks. Assistance to don clean gown while seated EOB. Pt stands with +2 assistance and takes several steps to recliner chair with min of 2. Pt needing increased time and cuing to redirect to tasks during session. OT called and discussed transfer with pt's RN in order for her to return to bed safely after eating lunch.    Recommendations for follow up therapy are one component of a multi-disciplinary discharge planning process, led by the attending physician.  Recommendations may be updated based on patient status, additional functional criteria and insurance authorization.    Assistance Recommended at Discharge Frequent or constant Supervision/Assistance  Patient can return home with the following  Two people to help with bathing/dressing/bathroom;Two people to help with walking and/or transfers;Assistance with cooking/housework;Assist for transportation;Help with stairs or ramp for entrance   Equipment Recommendations  Other (comment);Hospital bed (bariatric drop arm commode chair)       Precautions / Restrictions Precautions Precautions: Fall Restrictions Weight Bearing Restrictions: No       Mobility Bed Mobility Overal  bed mobility: Needs Assistance Bed Mobility: Supine to Sit     Supine to sit: Max assist, +2 for physical assistance, HOB elevated          Transfers Overall transfer level: Needs assistance Equipment used: Rolling walker (2 wheels) Transfers: Sit to/from Stand, Bed to chair/wheelchair/BSC Sit to Stand: Min assist, +2 physical assistance     Step pivot transfers: Min assist, +2 physical assistance     General transfer comment: pt mandates that author provides anterior guarding/stabilziation of BRW, but actually I didn't do anything because the walker was not in need of this (I did get setup in place just in case needed)     Balance Overall balance assessment: Needs assistance Sitting-balance support: No upper extremity supported, Feet supported Sitting balance-Leahy Scale: Fair     Standing balance support: Bilateral upper extremity supported Standing balance-Leahy Scale: Poor Standing balance comment: significant BUE support on AD                           ADL either performed or assessed with clinical judgement   ADL Overall ADL's : Needs assistance/impaired                 Upper Body Dressing : Minimal assistance;Sitting Upper Body Dressing Details (indicate cue type and reason): to don clean sleeping gown from home                   General ADL Comments: Pt needing assistance to cleanse buttocks and back while seated on EOB    Extremity/Trunk Assessment Upper Extremity Assessment Upper Extremity Assessment: Generalized weakness   Lower Extremity Assessment Lower Extremity Assessment: Generalized weakness   Cervical /  Trunk Assessment Cervical / Trunk Assessment: Normal    Vision Patient Visual Report: No change from baseline            Cognition Arousal/Alertness: Awake/alert Behavior During Therapy: WFL for tasks assessed/performed Overall Cognitive Status: Within Functional Limits for tasks assessed                                                      Pertinent Vitals/ Pain       Pain Assessment Pain Assessment: Faces Faces Pain Scale: Hurts whole lot Pain Location: lower back Pain Descriptors / Indicators: Grimacing, Moaning Pain Intervention(s): Limited activity within patient's tolerance, Monitored during session, Repositioned         Frequency  Min 1X/week        Progress Toward Goals  OT Goals(current goals can now be found in the care plan section)  Progress towards OT goals: Progressing toward goals     Plan Discharge plan needs to be updated;Frequency remains appropriate    Co-evaluation    PT/OT/SLP Co-Evaluation/Treatment: Yes Reason for Co-Treatment: Complexity of the patient's impairments (multi-system involvement);For patient/therapist safety;To address functional/ADL transfers PT goals addressed during session: Mobility/safety with mobility;Balance;Proper use of DME OT goals addressed during session: ADL's and self-care;Strengthening/ROM      AM-PAC OT "6 Clicks" Daily Activity     Outcome Measure   Help from another person eating meals?: A Little Help from another person taking care of personal grooming?: A Lot Help from another person toileting, which includes using toliet, bedpan, or urinal?: Total Help from another person bathing (including washing, rinsing, drying)?: Total Help from another person to put on and taking off regular upper body clothing?: A Lot Help from another person to put on and taking off regular lower body clothing?: Total 6 Click Score: 10    End of Session Equipment Utilized During Treatment: Rolling walker (2 wheels)  OT Visit Diagnosis: Unsteadiness on feet (R26.81);Repeated falls (R29.6);Muscle weakness (generalized) (M62.81)   Activity Tolerance Patient limited by pain   Patient Left in bed;with call bell/phone within reach;with bed alarm set   Nurse Communication Mobility status        Time: 1610-9604 OT  Time Calculation (min): 28 min  Charges: OT General Charges $OT Visit: 1 Visit OT Treatments $Self Care/Home Management : 8-22 mins  Jackquline Denmark, MS, OTR/L , CBIS ascom [phone]  10/07/22, 2:27 PM

## 2022-10-07 NOTE — Progress Notes (Signed)
PROGRESS NOTE    Kathryn Maddox  ZOX:[id number] DOB: [birth year] DOA: 10/02/2022 PCP: Marina Goodell, MD    Brief Narrative:   Kathryn Maddox is a 72 y.o. female  with medical history significant of stage III endometrial cancer complicated by pleural effusions s/p Pleurx drain, type 2 diabetes, hypertension, hyperlipidemia, morbid obesity, OSA on CPAP, who presented to the hospital with generalized weakness, bilateral lower extremity swelling and vaginal bleeding.    Assessment & Plan:   Principal Problem:   Symptomatic anemia Active Problems:   Generalized weakness   Hypoalbuminemia   Back pain   Endometrial cancer   Type 2 diabetes mellitus, without long-term current use of insulin   OSA (obstructive sleep apnea)  Symptomatic anemia, likely from acute blood loss anemia, underlying chronic iron deficiency anemia: Hemoglobin is 8.2.  S/p transfusion with 2 units of PRBCs.  No further blood transfusion for now.  Received IV Venofer 300 mg x 1 on 4/17.  Continue PO feosol.  Recheck Hb in AM     General weakness: This is likely multifactorial from endometrial cancer, anemia and deconditioning.  Her friends are concerned that she has been falling at home and she does not have any support.  They also says she does not have any equipment to help her get around.  Consult PT and OT.  Consulted Child psychotherapist to assist as well.  As for now the plan is to go to long-term care after discharge.  First choice [facility] may not be an option.  TOC and hospice liaison to follow-up with disposition plan.  May be a candidate for hospice facility.   Endometrial cancer with metastasis, vaginal bleeding: She has Pleurx catheter in place.  She is no longer on chemotherapy.  She is on hospice care at home.  She is aware that of long-term care is the disposition plan that she will no longer be eligible for hospice services.     Hyponatremia: Sodium is better today.  Probably due to malignancy.        Chronic back pain: Analgesics as needed for pain     OSA: Continue CPAP at night.     Hypoalbuminemia, chronic lower extremity edema: Encouraged adequate oral intake     Type II DM: NovoLog as needed.  Morbid obesity: This complicates overall care and prognosis  DVT prophylaxis: SCDs Code Status: DNR Family Communication: Friend at bedside 4/17 Disposition Plan: Status is: Inpatient Remains inpatient appropriate because: Severe anemia   Level of care: Med-Surg  Consultants:  None  Procedures:  None  Antimicrobials: None   Subjective: Seen and examined.  Resting in bed. NAD  Objective: Vitals:   10/06/22 1511 10/06/22 2201 10/07/22 0448 10/07/22 0832  BP: (!) 108/48 (!) 117/57 113/68 110/61  Pulse: 88 90 89 93  Resp: Temp: 98.1 F (36.7 C) 97.7 F (36.5 C) 98.7 F (37.1 C) (!) 97.5 F (36.4 C)  TempSrc: Oral     SpO2: 96% 97% 99% 97%  Weight:      Height:        Intake/Output Summary (Last 24 hours) at 10/07/2022 1323 Last data filed at 10/07/2022 0955 Gross per 24 hour  Intake 340 ml  Output 450 ml  Net -110 ml    Filed Weights   10/02/22 1134  Weight: (!) 138.1 kg    Examination:  General exam: NAD Respiratory system: Lungs clear.  Normal work of breathing.  Room air Cardiovascular system: S1-S2, RRR, no  murmurs, 1+ pedal edema Gastrointestinal system: Obese, soft, NT/ND, normal bowel sounds Central nervous system: Alert and oriented. No focal neurological deficits. Extremities: Symmetric 5 x 5 power. Skin: No rashes, lesions or ulcers Psychiatry: Judgement and insight appear normal. Mood & affect appropriate.     Data Reviewed: I have personally reviewed following labs and imaging studies  CBC: Recent Labs  Lab 10/02/22 1136 10/02/22 1854 10/03/22 0346 10/04/22 0420 10/05/22 0545 10/06/22 0754  WBC 9.8 9.4 8.8 9.1 8.5 8.6  NEUTROABS 7.3  --   --  6.7 6.1 6.2  HGB 6.5* 7.3* 7.5* 8.4* 8.3* 9.2*  HCT 21.8* 23.6*  23.9* 26.5* 26.0* 28.9*  MCV 75.7* 76.1* 75.9* 77.3* 76.9* 77.9*  PLT 570* 498* 481* 487* 470* 472*   Basic Metabolic Panel: Recent Labs  Lab 10/02/22 1136 10/03/22 0346 10/04/22 0420 10/05/22 0545 10/06/22 0754  NA 129* 128* 130* 126* 126*  K 4.0 4.1 4.3 4.3 4.7  CL 94* 94* 95* 93* 92*  CO2 GLUCOSE 106* 100* 100* 112* 88  BUN CREATININE 0.66 0.56 0.52 0.55 0.51  CALCIUM 8.1* 7.8* 7.6* 7.5* 7.9*   GFR: Estimated Creatinine Clearance: 85.4 mL/min (by C-G formula based on SCr of 0.51 mg/dL). Liver Function Tests: Recent Labs  Lab 10/02/22 1136  AST 21  ALT 8  ALKPHOS 51  BILITOT 0.7  PROT 6.2*  ALBUMIN 2.2*   No results for input(s): "LIPASE", "AMYLASE" in the last 168 hours. No results for input(s): "AMMONIA" in the last 168 hours. Coagulation Profile: No results for input(s): "INR", "PROTIME" in the last 168 hours. Cardiac Enzymes: No results for input(s): "CKTOTAL", "CKMB", "CKMBINDEX", "TROPONINI" in the last 168 hours. BNP (last 3 results) No results for input(s): "PROBNP" in the last 8760 hours. HbA1C: No results for input(s): "HGBA1C" in the last 72 hours. CBG: Recent Labs  Lab 10/05/22 0736 10/05/22 1122 10/05/22 1545 10/05/22 2003 10/06/22 0818  GLUCAP 99 105* 87 98 79   Lipid Profile: No results for input(s): "CHOL", "HDL", "LDLCALC", "TRIG", "CHOLHDL", "LDLDIRECT" in the last 72 hours. Thyroid Function Tests: No results for input(s): "TSH", "T4TOTAL", "FREET4", "T3FREE", "THYROIDAB" in the last 72 hours. Anemia Panel: No results for input(s): "VITAMINB12", "FOLATE", "FERRITIN", "TIBC", "IRON", "RETICCTPCT" in the last 72 hours.  Sepsis Labs: No results for input(s): "PROCALCITON", "LATICACIDVEN" in the last 168 hours.  No results found for this or any previous visit (from the past 240 hour(s)).       Radiology Studies: No results found.      Scheduled Meds:  acetaminophen  1,000 mg Oral Q6H    ferrous sulfate  325 mg Oral Q breakfast   mirtazapine  15 mg Oral QHS   senna-docusate  1 tablet Oral BID   sodium chloride flush  3 mL Intravenous Q12H   Continuous Infusions:  sodium chloride       LOS: 4 days       Tresa Moore, MD [facility]   If 7PM-7AM, please contact night-coverage  10/07/2022, 1:23 PM

## 2022-10-07 NOTE — TOC Progression Note (Signed)
Transition of Care [facility]) - Progression Note    Patient Details  Name: Kathryn Maddox MRN: [id number] Date of Birth: Apr 21, 1951  [facility] of [facility]) CM/SW Contact  Allena Katz, LCSW Phone Number: 10/07/2022, 9:57 AM  Clinical Narrative:   CSW spoke with patients neighbor who reports pt had no assistance at home. She states a lady came by once a week to do paitents laundry but reports that the only help she had was when she woud come by. She states pt would use the bathroom on herself and would not be changed until an aide from hospice came out to assist in changing her. She states pt does not have the funds for aides and reports she is agreeable to a referral being sent to [facility] but reports she is unsure if patients friend will agree to this.     10:04am [facility] unable to accept patient.    Expected Discharge Plan and Services                                               Social Determinants of Health (SDOH) Interventions SDOH Screenings   Food Insecurity: No Food Insecurity (10/02/2022)  Housing: Low Risk  (10/02/2022)  Transportation Needs: No Transportation Needs (10/02/2022)  Utilities: At Risk (10/02/2022)  Alcohol Screen: Low Risk  (05/20/2022)  Depression (PHQ2-9): Low Risk  (05/20/2022)  Financial Resource Strain: Low Risk  (05/20/2022)  Physical Activity: Inactive (05/20/2022)  Social Connections: Socially Isolated (05/20/2022)  Stress: Stress Concern Present (05/20/2022)  Tobacco Use: Low Risk  (10/02/2022)    Readmission Risk Interventions     No data to display

## 2022-10-07 NOTE — Progress Notes (Signed)
Physical Therapy Treatment Patient Details Name: Kathryn Maddox MRN: [id number] DOB: 09-21-1950 Today's Date: 10/07/2022   History of Present Illness Kathryn Maddox is a 72 y.o. female with medical history significant of stage III endometrial cancer complicated by pleural effusions s/p Pleurx drain, type 2 diabetes, hypertension, hyperlipidemia, morbid obesity, OSA on CPAP, who presents to the ED with complaints of generalized weakness.    PT Comments    Pt received supine in bed agreeable to PT/OT co-treat. Pt varies in assistance need day to day. Pt tolerated +1 assist with PT yesterday but needing maxA+2 for bed mobility and minA+2 for STS and SPT using BRW. Pt requiring frequent encouragement and multimodal cuing for participation due to reports of significant LBP. Bed noted to be completely soiled in urine due to pure wick malfunction requiring dependent pericare in standing and donning. Pt with very effortful SPT with poor hand placement throughout on BRW with poor carryover in hand placement despite max multimodal cuing. Pt in recliner with pillows for comfort and LE's elevated. Will continue POC as able.   Recommendations for follow up therapy are one component of a multi-disciplinary discharge planning process, led by the attending physician.  Recommendations may be updated based on patient status, additional functional criteria and insurance authorization.  Follow Up Recommendations  Can patient physically be transported by private vehicle: No    Assistance Recommended at Discharge Frequent or constant Supervision/Assistance  Patient can return home with the following Two people to help with walking and/or transfers;Two people to help with bathing/dressing/bathroom;Assistance with cooking/housework;Help with stairs or ramp for entrance   Equipment Recommendations  Hospital bed;Other (comment) (bariatric drop arm BSC)    Recommendations for Other Services       Precautions /  Restrictions Precautions Precautions: Fall Restrictions Weight Bearing Restrictions: No     Mobility  Bed Mobility Overal bed mobility: Needs Assistance       Supine to sit: Max assist, +2 for physical assistance, HOB elevated       Patient Response: Cooperative  Transfers Overall transfer level: Needs assistance Equipment used: Rolling walker (2 wheels) Transfers: Sit to/from Stand, Bed to chair/wheelchair/BSC Sit to Stand: Min assist, +2 physical assistance   Step pivot transfers: Min assist            Ambulation/Gait                   Stairs             Wheelchair Mobility    Modified Rankin (Stroke Patients Only)       Balance Overall balance assessment: Needs assistance Sitting-balance support: No upper extremity supported, Feet supported Sitting balance-Leahy Scale: Fair       Standing balance-Leahy Scale: Poor Standing balance comment: significant BUE support on AD                            Cognition Arousal/Alertness: Awake/alert Behavior During Therapy: WFL for tasks assessed/performed Overall Cognitive Status: Within Functional Limits for tasks assessed                                          Exercises      General Comments        Pertinent Vitals/Pain Pain Assessment Pain Assessment: Faces Faces Pain Scale: Hurts whole lot Pain Location: lower back Pain Descriptors /  Indicators: Grimacing, Moaning Pain Intervention(s): Limited activity within patient's tolerance, Monitored during session, Repositioned    Home Living                          Prior Function            PT Goals (current goals can now be found in the care plan section) Acute Rehab PT Goals Patient Stated Goal: to go home back with hospice care PT Goal Formulation: With patient Time For Goal Achievement: 10/18/22 Potential to Achieve Goals: Fair Progress towards PT goals: Progressing toward goals     Frequency    Min 2X/week      PT Plan Current plan remains appropriate    Co-evaluation   Reason for Co-Treatment: Complexity of the patient's impairments (multi-system involvement);For patient/therapist safety;To address functional/ADL transfers PT goals addressed during session: Mobility/safety with mobility;Balance;Proper use of DME OT goals addressed during session: ADL's and self-care;Strengthening/ROM      AM-PAC PT "6 Clicks" Mobility   Outcome Measure  Help needed turning from your back to your side while in a flat bed without using bedrails?: A Lot Help needed moving from lying on your back to sitting on the side of a flat bed without using bedrails?: A Lot Help needed moving to and from a bed to a chair (including a wheelchair)?: A Lot Help needed standing up from a chair using your arms (e.g., wheelchair or bedside chair)?: A Lot Help needed to walk in hospital room?: A Lot Help needed climbing 3-5 steps with a railing? : Total 6 Click Score: 11    End of Session Equipment Utilized During Treatment: Gait belt Activity Tolerance: Patient limited by pain Patient left: in chair;with call bell/phone within reach;with chair alarm set;with family/visitor present Nurse Communication: Mobility status PT Visit Diagnosis: Difficulty in walking, not elsewhere classified (R26.2);Muscle weakness (generalized) (M62.81)     Time: 0981-1914 PT Time Calculation (min) (ACUTE ONLY): 24 min  Charges:  $Therapeutic Activity: 8-22 mins                    Daxx Tiggs M. Fairly IV, PT, DPT Physical Therapist- [facility]  10/07/2022, 12:10 PM

## 2022-10-07 NOTE — Discharge Summary (Signed)
Physician Discharge Summary  Kathryn Maddox ZOX:[id number] DOB: 12/28/1950 DOA: 10/02/2022  PCP: Marina Goodell, MD  Admit date: 10/02/2022 Discharge date: 10/07/2022  Admitted From: Home Disposition:  Hospice facility  Recommendations for Outpatient Follow-up:  Per outpatient hospice provider   Home Health: N/A Equipment/Devices: None  Discharge Condition: Hospice CODE STATUS: DNR Diet recommendation: Comfort  Brief/Interim Summary:  Kathryn Maddox is a 72 y.o. female  with medical history significant of stage III endometrial cancer complicated by pleural effusions s/p Pleurx drain, type 2 diabetes, hypertension, hyperlipidemia, morbid obesity, OSA on CPAP, who presented to the hospital with generalized weakness, bilateral lower extremity swelling and vaginal bleeding.   Patient was given 2 units PRBC and 1 dose of intravenous iron 300 mg.  Hemoglobin responded appropriately.  Started on p.o. iron supplementation.  After multiple discussions with palliative care and hospice liaison patient has been accepted to an inpatient hospice facility.    Discharge Diagnoses:  Principal Problem:   Symptomatic anemia Active Problems:   Generalized weakness   Hypoalbuminemia   Back pain   Endometrial cancer   Type 2 diabetes mellitus, without long-term current use of insulin   OSA (obstructive sleep apnea)  Symptomatic anemia, likely from acute blood loss anemia, underlying chronic iron deficiency anemia: Hemoglobin is 8.2.  S/p transfusion with 2 units of PRBCs.  No further blood transfusion for now.  Received IV Venofer 300 mg x 1 on 4/17.  Continue PO feosol.       General weakness: This is likely multifactorial from endometrial cancer, anemia and deconditioning.  Her friends are concerned that she has been falling at home and she does not have any support.  They also says she does not have any equipment to help her get around.  Consult PT and OT.  Consulted Child psychotherapist to assist as  well.  Consulted hospice liaison.  After discussion decision made to discharge to inpatient hospice facility..   Endometrial cancer with metastasis, vaginal bleeding:  She is no longer on chemotherapy.  She is on hospice care at home.    Discharge Instructions  Discharge Instructions     Diet - low sodium heart healthy   Complete by: As directed    Increase activity slowly   Complete by: As directed       Allergies as of 10/07/2022       Reactions   Megace [megestrol] Rash   Penicillins Rash, Other (See Comments)   Has patient had a PCN reaction causing immediate rash, facial/tongue/throat swelling, SOB or lightheadedness with hypotension: Unknown Has patient had a PCN reaction causing severe rash involving mucus membranes or skin necrosis: Unknown Has patient had a PCN reaction that required hospitalization: Unknown Has patient had a PCN reaction occurring within the last 10 years: No If all of the above answers are "NO", then may proceed with Cephalosporin use.   Ibuprofen Hypertension   Pravastatin Other (See Comments)   Muscle pain, extreme constipation        Medication List     STOP taking these medications    azithromycin 250 MG tablet Commonly known as: Zithromax Z-Pak   dexamethasone 4 MG tablet Commonly known as: DECADRON   ergocalciferol 1.25 MG (50000 UT) capsule Commonly known as: VITAMIN D2   lidocaine-prilocaine cream Commonly known as: EMLA   ondansetron 8 MG tablet Commonly known as: Zofran   potassium chloride 10 MEQ tablet Commonly known as: KLOR-CON M   prochlorperazine 10 MG tablet Commonly known as: COMPAZINE  TAKE these medications    magnesium chloride 64 MG Tbec SR tablet Commonly known as: SLOW-MAG Take 1 tablet (64 mg total) by mouth daily. What changed:  when to take this reasons to take this   mirtazapine 15 MG tablet Commonly known as: REMERON Take 1 tablet (15 mg total) by mouth at bedtime.   morphine  CONCENTRATE 10 mg / 0.5 ml concentrated solution Take 0.25 mLs (5 mg total) by mouth every 2 (two) hours as needed for severe pain or shortness of breath.        Allergies  Allergen Reactions   Megace [Megestrol] Rash   Penicillins Rash and Other (See Comments)    Has patient had a PCN reaction causing immediate rash, facial/tongue/throat swelling, SOB or lightheadedness with hypotension: Unknown Has patient had a PCN reaction causing severe rash involving mucus membranes or skin necrosis: Unknown Has patient had a PCN reaction that required hospitalization: Unknown Has patient had a PCN reaction occurring within the last 10 years: No If all of the above answers are "NO", then may proceed with Cephalosporin use.    Ibuprofen Hypertension   Pravastatin Other (See Comments)    Muscle pain, extreme constipation    Consultations: Palliative care   Procedures/Studies: No results found.    Subjective: Seen and examined on the day of discharge.  Stable no distress.  Appropriate for discharge to inpatient hospice facility.  Discharge Exam: Vitals:   10/07/22 0448 10/07/22 0832  BP: 113/68 110/61  Pulse: 89 93  Resp: 20 18  Temp: 98.7 F (37.1 C) (!) 97.5 F (36.4 C)  SpO2: 99% 97%   Vitals:   10/06/22 1511 10/06/22 2201 10/07/22 0448 10/07/22 0832  BP: (!) 108/48 (!) 117/57 113/68 110/61  Pulse: 88 90 89 93  Resp: Temp: 98.1 F (36.7 C) 97.7 F (36.5 C) 98.7 F (37.1 C) (!) 97.5 F (36.4 C)  TempSrc: Oral     SpO2: 96% 97% 99% 97%  Weight:      Height:        General: Pt is alert, awake, not in acute distress Cardiovascular: RRR, S1/S2 +, no rubs, no gallops Respiratory: CTA bilaterally, no wheezing, no rhonchi Abdominal: Soft, NT, ND, bowel sounds + Extremities: no edema, no cyanosis    The results of significant diagnostics from this hospitalization (including imaging, microbiology, ancillary and laboratory) are listed below for reference.      Microbiology: No results found for this or any previous visit (from the past 240 hour(s)).   Labs: BNP (last 3 results) No results for input(s): "BNP" in the last 8760 hours. Basic Metabolic Panel: Recent Labs  Lab 10/02/22 1136 10/03/22 0346 10/04/22 0420 10/05/22 0545 10/06/22 0754  NA 129* 128* 130* 126* 126*  K 4.0 4.1 4.3 4.3 4.7  CL 94* 94* 95* 93* 92*  CO2 GLUCOSE 106* 100* 100* 112* 88  BUN CREATININE 0.66 0.56 0.52 0.55 0.51  CALCIUM 8.1* 7.8* 7.6* 7.5* 7.9*   Liver Function Tests: Recent Labs  Lab 10/02/22 1136  AST 21  ALT 8  ALKPHOS 51  BILITOT 0.7  PROT 6.2*  ALBUMIN 2.2*   No results for input(s): "LIPASE", "AMYLASE" in the last 168 hours. No results for input(s): "AMMONIA" in the last 168 hours. CBC: Recent Labs  Lab 10/02/22 1136 10/02/22 1854 10/03/22 0346 10/04/22 0420 10/05/22 0545 10/06/22 0754  WBC 9.8 9.4 8.8 9.1  8.5 8.6  NEUTROABS 7.3  --   --  6.7 6.1 6.2  HGB 6.5* 7.3* 7.5* 8.4* 8.3* 9.2*  HCT 21.8* 23.6* 23.9* 26.5* 26.0* 28.9*  MCV 75.7* 76.1* 75.9* 77.3* 76.9* 77.9*  PLT 570* 498* 481* 487* 470* 472*   Cardiac Enzymes: No results for input(s): "CKTOTAL", "CKMB", "CKMBINDEX", "TROPONINI" in the last 168 hours. BNP: Invalid input(s): "POCBNP" CBG: Recent Labs  Lab 10/05/22 0736 10/05/22 1122 10/05/22 1545 10/05/22 2003 10/06/22 0818  GLUCAP 99 105* 87 98 79   D-Dimer No results for input(s): "DDIMER" in the last 72 hours. Hgb A1c No results for input(s): "HGBA1C" in the last 72 hours. Lipid Profile No results for input(s): "CHOL", "HDL", "LDLCALC", "TRIG", "CHOLHDL", "LDLDIRECT" in the last 72 hours. Thyroid function studies No results for input(s): "TSH", "T4TOTAL", "T3FREE", "THYROIDAB" in the last 72 hours.  Invalid input(s): "FREET3" Anemia work up No results for input(s): "VITAMINB12", "FOLATE", "FERRITIN", "TIBC", "IRON", "RETICCTPCT" in the last 72 hours. Urinalysis     Component Value Date/Time   APPEARANCEUR Clear 02/28/2022 1352   GLUCOSEU Negative 02/28/2022 1352   BILIRUBINUR Negative 02/28/2022 1352   PROTEINUR 2+ (A) 02/28/2022 1352   NITRITE Negative 02/28/2022 1352   LEUKOCYTESUR Negative 02/28/2022 1352   Sepsis Labs Recent Labs  Lab 10/03/22 0346 10/04/22 0420 10/05/22 0545 10/06/22 0754  WBC 8.8 9.1 8.5 8.6   Microbiology No results found for this or any previous visit (from the past 240 hour(s)).   Time coordinating discharge: Over 30 minutes  SIGNED:   Tresa Moore, MD  [facility] 10/07/2022, 3:24 PM Pager   If 7PM-7AM, please contact night-coverage

## 2022-10-07 NOTE — Progress Notes (Signed)
[facility] [address] [facility])    Patient  to discharge to the Hospice Home this evening.  EMS will be arranged for transport after 8pm this evening.     Please send signed DNR form with patient and RN call report to [phone].   [facility] Liaison [phone]

## 2022-10-12 ENCOUNTER — Ambulatory Visit: Payer: PPO | Admitting: Physical Therapy

## 2022-10-13 DIAGNOSIS — G4733 Obstructive sleep apnea (adult) (pediatric): Secondary | ICD-10-CM | POA: Diagnosis not present

## 2022-10-19 ENCOUNTER — Ambulatory Visit: Payer: PPO | Admitting: Physical Therapy

## 2022-10-19 DEATH — deceased

## 2022-10-26 ENCOUNTER — Ambulatory Visit: Payer: PPO | Admitting: Physical Therapy

## 2022-11-01 ENCOUNTER — Encounter: Payer: Self-pay | Admitting: Internal Medicine

## 2022-11-02 ENCOUNTER — Ambulatory Visit: Payer: PPO | Admitting: Physical Therapy

## 2022-11-09 ENCOUNTER — Ambulatory Visit: Payer: PPO | Admitting: Physical Therapy

## 2022-11-16 ENCOUNTER — Ambulatory Visit: Payer: HMO | Admitting: Radiation Oncology
